# Patient Record
Sex: Male | Born: 1965
Health system: Southern US, Community
[De-identification: ages and names within clinical notes are randomized; demographics above are authoritative.]

## PROBLEM LIST (undated history)

## (undated) DIAGNOSIS — I10 Essential (primary) hypertension: Secondary | ICD-10-CM

## (undated) DIAGNOSIS — K219 Gastro-esophageal reflux disease without esophagitis: Secondary | ICD-10-CM

## (undated) DIAGNOSIS — Z5189 Encounter for other specified aftercare: Secondary | ICD-10-CM

## (undated) DIAGNOSIS — E119 Type 2 diabetes mellitus without complications: Secondary | ICD-10-CM

## (undated) DIAGNOSIS — I509 Heart failure, unspecified: Secondary | ICD-10-CM

## (undated) DIAGNOSIS — D649 Anemia, unspecified: Secondary | ICD-10-CM

## (undated) DIAGNOSIS — E785 Hyperlipidemia, unspecified: Secondary | ICD-10-CM

## (undated) HISTORY — DX: Anemia, unspecified: D64.9

## (undated) HISTORY — DX: Encounter for other specified aftercare: Z51.89

## (undated) HISTORY — DX: Hyperlipidemia, unspecified: E78.5

## (undated) HISTORY — DX: Heart failure, unspecified: I50.9

## (undated) HISTORY — DX: Essential (primary) hypertension: I10

## (undated) HISTORY — DX: Gastro-esophageal reflux disease without esophagitis: K21.9

## (undated) HISTORY — DX: Type 2 diabetes mellitus without complications: E11.9

## (undated) HISTORY — PX: ORIF HIP FRACTURE: SHX2125

## (undated) SURGERY — Surgical Case
Anesthesia: *Unknown

---

## 2001-12-22 HISTORY — PX: ORIF HIP FRACTURE: SHX2125

## 2007-04-19 ENCOUNTER — Ambulatory Visit (HOSPITAL_COMMUNITY): Admission: RE | Admit: 2007-04-19 | Discharge: 2007-04-19 | Payer: Self-pay | Admitting: *Deleted

## 2007-04-19 HISTORY — PX: COLONOSCOPY: SHX174

## 2007-06-23 LAB — HM CT VIRTUAL COLONOSCOPY

## 2012-01-19 ENCOUNTER — Ambulatory Visit (INDEPENDENT_AMBULATORY_CARE_PROVIDER_SITE_OTHER): Payer: Managed Care, Other (non HMO)

## 2012-01-19 DIAGNOSIS — E669 Obesity, unspecified: Secondary | ICD-10-CM

## 2012-01-19 DIAGNOSIS — Z23 Encounter for immunization: Secondary | ICD-10-CM

## 2012-01-19 DIAGNOSIS — I10 Essential (primary) hypertension: Secondary | ICD-10-CM

## 2012-01-19 DIAGNOSIS — E119 Type 2 diabetes mellitus without complications: Secondary | ICD-10-CM

## 2012-01-19 DIAGNOSIS — E7800 Pure hypercholesterolemia, unspecified: Secondary | ICD-10-CM

## 2012-01-26 ENCOUNTER — Other Ambulatory Visit: Payer: Self-pay | Admitting: Family Medicine

## 2012-01-26 MED ORDER — SIMVASTATIN 40 MG PO TABS: 40.0000 mg | ORAL_TABLET | Freq: Every day | ORAL | Status: DC

## 2012-01-29 ENCOUNTER — Telehealth: Payer: Self-pay

## 2012-01-29 NOTE — Telephone Encounter (Signed)
.  [facility]   PT IS REQUESTING HIS TEST RESULTS  PLEASE CALL

## 2012-01-30 NOTE — Telephone Encounter (Signed)
RETURNED CALL TO B.JACKSON

## 2012-01-31 ENCOUNTER — Telehealth: Payer: Self-pay

## 2012-01-31 NOTE — Telephone Encounter (Signed)
Wants to discuss test results.

## 2012-02-02 NOTE — Telephone Encounter (Signed)
[facility] at home number explaining test results and Dr Ellis Parents note on labs. Asked for CB if pt has further questions.

## 2012-03-06 ENCOUNTER — Other Ambulatory Visit: Payer: Self-pay | Admitting: Emergency Medicine

## 2012-04-10 ENCOUNTER — Other Ambulatory Visit: Payer: Self-pay | Admitting: Emergency Medicine

## 2012-04-16 ENCOUNTER — Other Ambulatory Visit: Payer: Self-pay | Admitting: Emergency Medicine

## 2012-08-03 ENCOUNTER — Other Ambulatory Visit: Payer: Self-pay | Admitting: Emergency Medicine

## 2012-08-03 NOTE — Telephone Encounter (Signed)
Chart pulled [id number]

## 2012-08-03 NOTE — Telephone Encounter (Signed)
Needs office visit.

## 2012-09-11 ENCOUNTER — Other Ambulatory Visit: Payer: Self-pay | Admitting: Physician Assistant

## 2012-09-11 ENCOUNTER — Other Ambulatory Visit: Payer: Self-pay | Admitting: Emergency Medicine

## 2012-10-01 ENCOUNTER — Other Ambulatory Visit: Payer: Self-pay | Admitting: Physician Assistant

## 2012-10-04 NOTE — Telephone Encounter (Signed)
Patients chart is at the nurses station in the pa pool pile.  [facility] [id number]

## 2012-10-14 ENCOUNTER — Other Ambulatory Visit: Payer: Self-pay | Admitting: Physician Assistant

## 2012-11-21 ENCOUNTER — Other Ambulatory Visit: Payer: Self-pay | Admitting: Physician Assistant

## 2012-12-16 ENCOUNTER — Other Ambulatory Visit: Payer: Self-pay | Admitting: Physician Assistant

## 2012-12-17 ENCOUNTER — Ambulatory Visit (INDEPENDENT_AMBULATORY_CARE_PROVIDER_SITE_OTHER): Payer: Managed Care, Other (non HMO) | Admitting: Family Medicine

## 2012-12-17 VITALS — BP 142/91 | HR 92 | Temp 97.7°F | Resp 18 | Ht 68.5 in | Wt 235.4 lb

## 2012-12-17 DIAGNOSIS — B356 Tinea cruris: Secondary | ICD-10-CM

## 2012-12-17 DIAGNOSIS — E119 Type 2 diabetes mellitus without complications: Secondary | ICD-10-CM

## 2012-12-17 DIAGNOSIS — N529 Male erectile dysfunction, unspecified: Secondary | ICD-10-CM

## 2012-12-17 DIAGNOSIS — I10 Essential (primary) hypertension: Secondary | ICD-10-CM

## 2012-12-17 DIAGNOSIS — E785 Hyperlipidemia, unspecified: Secondary | ICD-10-CM

## 2012-12-17 LAB — COMPREHENSIVE METABOLIC PANEL
ALT: 20 U/L (ref 0–53)
AST: 15 U/L (ref 0–37)
Albumin: 4.5 g/dL (ref 3.5–5.2)
Alkaline Phosphatase: 57 U/L (ref 39–117)
Glucose, Bld: 295 mg/dL — ABNORMAL HIGH (ref 70–99)
Potassium: 4.5 mEq/L (ref 3.5–5.3)
Sodium: 134 mEq/L — ABNORMAL LOW (ref 135–145)
Total Protein: 7.3 g/dL (ref 6.0–8.3)

## 2012-12-17 LAB — POCT GLYCOSYLATED HEMOGLOBIN (HGB A1C): Hemoglobin A1C: 11.3

## 2012-12-17 LAB — LIPID PANEL
LDL Cholesterol: 88 mg/dL (ref 0–99)
Triglycerides: 224 mg/dL — ABNORMAL HIGH (ref <150)

## 2012-12-17 MED ORDER — GLIPIZIDE ER 5 MG PO TB24: 5.0000 mg | ORAL_TABLET | Freq: Every day | ORAL | Status: DC

## 2012-12-17 MED ORDER — SIMVASTATIN 40 MG PO TABS: 40.0000 mg | ORAL_TABLET | Freq: Every day | ORAL | Status: DC

## 2012-12-17 MED ORDER — FLUCONAZOLE 150 MG PO TABS: 150.0000 mg | ORAL_TABLET | Freq: Once | ORAL | Status: DC

## 2012-12-17 MED ORDER — SILDENAFIL CITRATE 100 MG PO TABS: 100.0000 mg | ORAL_TABLET | ORAL | Status: DC | PRN

## 2012-12-17 MED ORDER — METFORMIN HCL 1000 MG PO TABS: 1000.0000 mg | ORAL_TABLET | Freq: Two times a day (BID) | ORAL | Status: DC

## 2012-12-17 MED ORDER — GLIPIZIDE ER 10 MG PO TB24: 10.0000 mg | ORAL_TABLET | Freq: Every day | ORAL | Status: DC

## 2012-12-17 MED ORDER — LOSARTAN POTASSIUM 100 MG PO TABS: 100.0000 mg | ORAL_TABLET | Freq: Every day | ORAL | Status: DC

## 2012-12-17 NOTE — Progress Notes (Signed)
[facility] [address] [place] [address] [phone]  Date:  12/17/2012   Name:  Maggie Dworkin   DOB:  27-Mar-1966   MRN:  [id number]  PCP:  No primary provider on file.    Chief Complaint: Medication Refill   History of Present Illness:  Salvador Coupe is a 46 y.o. very pleasant male patient who presents with the following:  He is here today for medication refills.  He was last here about one year ago.  He is doing well and has no complaints. He ran out of his meds just yesterday.  He is fasting today.    He also noted recurrent problems with his penis- he will develop redness and irritation around the glans, especially after he has intercourse.  He has been told this was related to his DM in the past and has used some sort of cream which helped.    Also, there is some confusion on his medication list.    Glucotrol is listed, but he states he was told to stop this last year.  However, it was refilled last in March of this year.  At any rate. He is not taking it at this time.  He does not check his blood sugars much but when he does check they tend to be in the 300s or higher  There is no problem list on file for this patient.   No past medical history on file.  No past surgical history on file.  History  Substance Use Topics  . Smoking status: Never Smoker   . Smokeless tobacco: Not on file  . Alcohol Use: No    No family history on file.  Allergies no known allergies  Medication list has been reviewed and updated.  Current Outpatient Prescriptions on File Prior to Visit  Medication Sig Dispense Refill  . glipiZIDE (GLUCOTROL XL) 10 MG 24 hr tablet TAKE 1 TABLET BY MOUTH DAILY. OFFICE VISIT  30 tablet  0  . losartan (COZAAR) 100 MG tablet TAKE 1 TABLET BY MOUTH ONCE A DAY  15 tablet  0  . metFORMIN (GLUCOPHAGE) 1000 MG tablet TAKE 1 TABLET BY MOUTH TWICE A DAY  30 tablet  0  . simvastatin (ZOCOR) 40 MG tablet TAKE 1 TABLET BY MOUTH DAILY  15  tablet  0  . VIAGRA 100 MG tablet USE AS DIRECTED  9 tablet  1    Review of Systems:  As per HPI- otherwise negative.   Physical Examination: Filed Vitals:   12/17/12 1111  BP: 142/91  Pulse: 92  Temp: 97.7 F (36.5 C)  Resp: 18   Filed Vitals:   12/17/12 1111  Height: 5' 8.5" (1.74 m)  Weight: 235 lb 6.4 oz (106.777 kg)   Body mass index is 35.27 kg/(m^2). Ideal Body Weight: Weight in (lb) to have BMI = 25: 166.5   GEN: WDWN, NAD, Non-toxic, A & O x 3, obese HEENT: Atraumatic, Normocephalic. Neck supple. No masses, No LAD. Ears and Nose: No external deformity. CV: RRR, No M/G/R. No JVD. No thrill. No extra heart sounds. PULM: CTA B, no wheezes, crackles, rhonchi. No retractions. No resp. distress. No accessory muscle use. ABD: S, NT, ND, +BS. No rebound. No HSM. EXTR: No c/c/e NEURO Normal gait.  PSYCH: Normally interactive. Conversant. Not depressed or anxious appearing.  Calm demeanor.  GU: uncircumcised.  The glans shows irritation and mild redness consistent with a yeast infection of the glans.  Results for orders placed in visit on 12/17/12  POCT GLYCOSYLATED HEMOGLOBIN (HGB A1C)      Component Value Range   Hemoglobin A1C 11.3      Assessment and Plan: 1. Diabetes mellitus, type 2  metFORMIN (GLUCOPHAGE) 1000 MG tablet, POCT glycosylated hemoglobin (Hb A1C), Lipid panel, glipiZIDE (GLUCOTROL XL) 5 MG 24 hr tablet, DISCONTINUED: glipiZIDE (GLUCOTROL XL) 10 MG 24 hr tablet  2. HTN (hypertension)  losartan (COZAAR) 100 MG tablet, Comprehensive metabolic panel, Lipid panel  3. Hyperlipidemia  simvastatin (ZOCOR) 40 MG tablet, Lipid panel  4. ED (erectile dysfunction)  sildenafil (VIAGRA) 100 MG tablet, Lipid panel  5. Tinea cruris  fluconazole (DIFLUCAN) 150 MG tablet   Poorly controlled DM.  Continue metformin and ad back glucotrol XL at 5mg  a day.  Counseled him that he must work on diet, exercise and weight loss.  Persistent yeast balinitis.  Related to  elevated glucose.  Will treat with oral diflucan, 2 pills 72 hours apart.  Hold zocor/ viagra while on antifungal.    Bp control ok, weight loss will also help here.    Refilled zocor, refilled viagra.  Await labs- Will plan further follow- up pending labs.   Abbe Amsterdam, MD

## 2012-12-17 NOTE — Patient Instructions (Addendum)
Buy some lotrimin cream and use it on your penis twice a day.  Be sure to keep the area dry and cool.   Take a diflucan once, then repeat in 3 days.  Do not take your zocor or viagra for 2 days prior to taking the diflucan, and continue to hold your zocor and viagra until your finish the diflucan.    Please come and see [place] in 2 months to follow- up your diabetes.    Your diabetes is NOT well controlled- your A1c is 11.3- this gives an idea of your average blood sugar over the last 2 months.  Our goal for your A1c is less than 7%.  We are going to restart your glucotrol at 5mg  (in addition to metformin) to bring your blood sugars down.  However, we also need you to start exercising more and to lose weight.

## 2012-12-18 ENCOUNTER — Encounter: Payer: Self-pay | Admitting: Family Medicine

## 2013-01-08 ENCOUNTER — Ambulatory Visit (INDEPENDENT_AMBULATORY_CARE_PROVIDER_SITE_OTHER): Payer: Managed Care, Other (non HMO) | Admitting: Family Medicine

## 2013-01-08 VITALS — BP 103/70 | HR 93 | Temp 97.8°F | Resp 18 | Ht 69.0 in | Wt 239.0 lb

## 2013-01-08 DIAGNOSIS — J40 Bronchitis, not specified as acute or chronic: Secondary | ICD-10-CM

## 2013-01-08 MED ORDER — AZITHROMYCIN 250 MG PO TABS: ORAL_TABLET | ORAL | Status: DC

## 2013-01-08 MED ORDER — BENZONATATE 200 MG PO CAPS: 200.0000 mg | ORAL_CAPSULE | Freq: Three times a day (TID) | ORAL | Status: DC | PRN

## 2013-01-08 NOTE — Progress Notes (Signed)
Patient ID: Alexander [name] MRN: [id number], DOB: March 04, 1966, 47 y.o. Date of Encounter: 01/08/2013, 2:14 PM  Primary Physician: No primary provider on file.  Chief Complaint:  Chief Complaint  Patient presents with  . Cough    x 6 days  . Generalized Body Aches    x 6 days  . chest congestion    x 6 days    HPI: 47 y.o. year old male presents with a 7 day history of nasal congestion, post nasal drip, sore throat, and cough. Mild sinus pressure. Afebrile. No chills. Nasal congestion thick and green/yellow. Cough is productive of green/yellow sputum and not associated with time of day. Ears feel full, leading to sensation of muffled hearing. Has tried OTC cold preps without success. No GI complaints. Appetite decreased  No blurry vision, polyuria or hyperglycemia  No sick contacts, recent antibiotics, or recent travels.   No leg trauma, sedentary periods, h/o cancer, or tobacco use.  History reviewed. No pertinent past medical history.   Home Meds: Prior to Admission medications   Medication Sig Start Date End Date Taking? Authorizing Provider  glipiZIDE (GLUCOTROL XL) 5 MG 24 hr tablet Take 1 tablet (5 mg total) by mouth daily. 12/17/12  Yes Gwenlyn Found Copland, MD  losartan (COZAAR) 100 MG tablet Take 1 tablet (100 mg total) by mouth daily. 12/17/12  Yes Gwenlyn Found Copland, MD  metFORMIN (GLUCOPHAGE) 1000 MG tablet Take 1 tablet (1,000 mg total) by mouth 2 (two) times daily. 12/17/12  Yes Gwenlyn Found Copland, MD  sildenafil (VIAGRA) 100 MG tablet Take 1 tablet (100 mg total) by mouth as needed for erectile dysfunction. 12/17/12  Yes Gwenlyn Found Copland, MD  simvastatin (ZOCOR) 40 MG tablet Take 1 tablet (40 mg total) by mouth at bedtime. 12/17/12  Yes Gwenlyn Found Copland, MD  fluconazole (DIFLUCAN) 150 MG tablet Take 1 tablet (150 mg total) by mouth once. Repeat in 3 days 12/17/12   Pearline Cables, MD    Allergies: No Known Allergies  History   Social History  . Marital Status:  Married    Spouse Name: N/A    Number of Children: N/A  . Years of Education: N/A   Occupational History  . Not on file.   Social History Main Topics  . Smoking status: Never Smoker   . Smokeless tobacco: Not on file  . Alcohol Use: No  . Drug Use: No  . Sexually Active: Not on file   Other Topics Concern  . Not on file   Social History Narrative  . No narrative on file     Review of Systems: Constitutional: negative for chills, fever, night sweats or weight changes Cardiovascular: negative for chest pain or palpitations Respiratory: negative for hemoptysis, wheezing, or shortness of breath Abdominal: negative for abdominal pain, nausea, vomiting or diarrhea Dermatological: negative for rash Neurologic: negative for headache   Physical Exam: Blood pressure 103/70, pulse 93, temperature 97.8 F (36.6 C), temperature source Oral, resp. rate 18, height 5\' 9"  (1.753 m), weight 239 lb (108.41 kg), SpO2 97.00%., Body mass index is 35.29 kg/(m^2). General: Well developed, well nourished, in no acute distress. Head: Normocephalic, atraumatic, eyes without discharge, sclera non-icteric, nares are congested. Bilateral auditory canals clear, TM's are without perforation, pearly grey with reflective cone of light bilaterally. No sinus TTP. Oral cavity moist, dentition normal. Posterior pharynx with post nasal drip and mild erythema. No peritonsillar abscess or tonsillar exudate. Neck: Supple. No thyromegaly. Full ROM. No lymphadenopathy. Lungs: Coarse  breath sounds bilaterally without wheezes, rales, or rhonchi. Breathing is unlabored.  Heart: RRR with S1 S2. No murmurs, rubs, or gallops appreciated. Msk:  Strength and tone normal for age. Extremities: No clubbing or cyanosis. No edema. Neuro: Alert and oriented X 3. Moves all extremities spontaneously. CNII-XII grossly in tact. Psych:  Responds to questions appropriately with a normal affect.      ASSESSMENT AND PLAN:  47 y.o.  year old male with bronchitis. - -Mucinex -Tylenol/Motrin prn -Rest/fluids -RTC precautions -RTC 3-5 days if no improvement  Signed, Elvina Sidle, MD 01/08/2013 2:14 PM

## 2013-01-08 NOTE — Patient Instructions (Signed)

## 2013-04-01 ENCOUNTER — Other Ambulatory Visit: Payer: Self-pay | Admitting: Family Medicine

## 2013-04-02 ENCOUNTER — Other Ambulatory Visit: Payer: Self-pay | Admitting: Family Medicine

## 2013-04-14 ENCOUNTER — Ambulatory Visit: Payer: Managed Care, Other (non HMO) | Admitting: Emergency Medicine

## 2013-04-14 VITALS — BP 124/84 | HR 80 | Temp 98.4°F | Resp 18 | Wt 241.0 lb

## 2013-04-14 DIAGNOSIS — N489 Disorder of penis, unspecified: Secondary | ICD-10-CM

## 2013-04-14 DIAGNOSIS — N481 Balanitis: Secondary | ICD-10-CM

## 2013-04-14 DIAGNOSIS — N476 Balanoposthitis: Secondary | ICD-10-CM

## 2013-04-14 DIAGNOSIS — N4889 Other specified disorders of penis: Secondary | ICD-10-CM

## 2013-04-14 DIAGNOSIS — E785 Hyperlipidemia, unspecified: Secondary | ICD-10-CM

## 2013-04-14 DIAGNOSIS — E119 Type 2 diabetes mellitus without complications: Secondary | ICD-10-CM

## 2013-04-14 LAB — LIPID PANEL
Cholesterol: 144 mg/dL (ref 0–200)
HDL: 52 mg/dL (ref >39)
Total CHOL/HDL Ratio: 2.8 Ratio
Triglycerides: 131 mg/dL (ref <150)
VLDL: 26 mg/dL (ref 0–40)

## 2013-04-14 MED ORDER — METFORMIN HCL 1000 MG PO TABS: 1000.0000 mg | ORAL_TABLET | Freq: Two times a day (BID) | ORAL | Status: DC

## 2013-04-14 MED ORDER — FLUCONAZOLE 150 MG PO TABS: 150.0000 mg | ORAL_TABLET | Freq: Once | ORAL | Status: DC

## 2013-04-14 NOTE — Progress Notes (Signed)
  Subjective:    Patient ID: Alexander [name], male    DOB: Mar 27, 1966, 47 y.o.   MRN: [id number]  HPI 47 yo male here for recheck on blood pressure and diabetes. Occasionally checks BP at drug store where it is generally 130/75. Checks sugar regularly, generally between 220-300. Saw eye doctor last month. Also reports he has a yeast infection on his penis, which he would like a refill of Fluconazole for. Also has a circular lesion beneath the foreskin of his penis that has been there for at least a month.     Review of Systems  Constitutional: Negative for fever, chills and unexpected weight change.  Respiratory: Negative.   Cardiovascular: Negative.   Gastrointestinal: Negative for nausea, vomiting, diarrhea and constipation.  Skin: Positive for rash (in groin).       Objective:   Physical Exam  Constitutional: He is oriented to person, place, and time. He appears well-developed and well-nourished.  HENT:  Head: Normocephalic and atraumatic.  Cardiovascular: Normal rate, regular rhythm and normal heart sounds.   Pulmonary/Chest: Effort normal and breath sounds normal.  Abdominal: Soft. Bowel sounds are normal.  Neurological: He is alert and oriented to person, place, and time.  Skin: Skin is warm and dry.  Has a 1x1cm circumscribed lesion at the base of the glans with central crater formation which is non-tender. Some whitish discharge on the under side of the foreskin with mild redness.  Results for orders placed in visit on 04/14/13  GLUCOSE, POCT (MANUAL RESULT ENTRY)      Result Value Range   POC Glucose 257 (*) 70 - 99 mg/dl  POCT GLYCOSYLATED HEMOGLOBIN (HGB A1C)      Result Value Range   Hemoglobin A1C 11.2            Assessment & Plan:  Hypertension: controlled. Continue current dose of medication Hyperlipidemia: check lipids today Diabetes: Not controlled, increased metformin to 1000mg  bid. Balanitis: Rx for diflucan given. Penile lesion: RPR ordered today.  Referral to derm for evaluation and possible biopsy.

## 2013-05-12 ENCOUNTER — Other Ambulatory Visit: Payer: Self-pay | Admitting: Emergency Medicine

## 2013-05-13 NOTE — Telephone Encounter (Signed)
Dr Cleta Alberts, do you want to RF this? Pt appt w/dermatologist is today, do you want him to Rx if needed?

## 2013-06-07 ENCOUNTER — Other Ambulatory Visit: Payer: Self-pay | Admitting: Family Medicine

## 2013-06-18 ENCOUNTER — Other Ambulatory Visit: Payer: Self-pay | Admitting: Emergency Medicine

## 2013-06-20 NOTE — Telephone Encounter (Signed)
Dr Cleta Alberts, do you want to RF or need to see pt for eval first?

## 2013-06-23 ENCOUNTER — Other Ambulatory Visit: Payer: Self-pay | Admitting: Family Medicine

## 2013-07-11 ENCOUNTER — Other Ambulatory Visit: Payer: Self-pay | Admitting: Family Medicine

## 2013-09-17 ENCOUNTER — Ambulatory Visit (INDEPENDENT_AMBULATORY_CARE_PROVIDER_SITE_OTHER): Payer: Managed Care, Other (non HMO) | Admitting: Emergency Medicine

## 2013-09-17 VITALS — BP 110/70 | HR 91 | Temp 98.1°F | Resp 16 | Ht 69.5 in | Wt 240.0 lb

## 2013-09-17 DIAGNOSIS — H469 Unspecified optic neuritis: Secondary | ICD-10-CM

## 2013-09-17 DIAGNOSIS — E119 Type 2 diabetes mellitus without complications: Secondary | ICD-10-CM

## 2013-09-17 LAB — COMPREHENSIVE METABOLIC PANEL
ALT: 23 U/L (ref 0–53)
AST: 15 U/L (ref 0–37)
Calcium: 9.6 mg/dL (ref 8.4–10.5)
Chloride: 97 mEq/L (ref 96–112)
Creat: 0.76 mg/dL (ref 0.50–1.35)
Sodium: 133 mEq/L — ABNORMAL LOW (ref 135–145)
Total Bilirubin: 1.5 mg/dL — ABNORMAL HIGH (ref 0.3–1.2)
Total Protein: 7.6 g/dL (ref 6.0–8.3)

## 2013-09-17 LAB — POCT CBC
Granulocyte percent: 45.2 %G (ref 37–80)
Hemoglobin: 15.6 g/dL (ref 14.1–18.1)
Lymph, poc: 3.6 — AB (ref 0.6–3.4)
MCV: 92.8 fL (ref 80–97)
MID (cbc): 0.4 (ref 0–0.9)
Platelet Count, POC: 245 10*3/uL (ref 142–424)
RBC: 5.18 M/uL (ref 4.69–6.13)

## 2013-09-17 LAB — POCT GLYCOSYLATED HEMOGLOBIN (HGB A1C): Hemoglobin A1C: 11.2

## 2013-09-17 LAB — POCT SEDIMENTATION RATE: POCT SED RATE: 13 mm/hr (ref 0–22)

## 2013-09-17 NOTE — Patient Instructions (Signed)
Please go to [facility] for an MRI of the brain to be done at 6 PM today

## 2013-09-17 NOTE — Progress Notes (Signed)
  Subjective:    Patient ID: Alexander [name], male    DOB: May 08, 1966, 47 y.o.   MRN: [id number]  HPI  47 y.o. Male here per request of Dr. Georganna Skeans. The eye doctor was concerned the patient had non-arteritic  anterior ischemic optic neuropathy   Denies any headaches.States,"when symptoms started I just didn't feel good." Patient states his sugars have been running 150-200. He checks his sugars about 3 times a week. He states he is watching his diet closely to .he states he had an eye exam approximately 1 year ago. The last 2 hemoglobin A1c is 11.2 and 11.3     Review of Systems     Objective:   Physical Exam HEENT exam reveals some blurring of the left lateral and inferior optic disc. Pupils are still dilated from his eye exam earlier today. Neck is supple chest clear heart regular rate no murmurs abdomen is soft nontender extremities without edema. Filament testing revealed normal sensation .Marland Kitchen  Results for orders placed in visit on 09/17/13  POCT CBC      Result Value Range   WBC 7.4  4.6 - 10.2 K/uL   Lymph, poc 3.6 (*) 0.6 - 3.4   POC LYMPH PERCENT 49.2  10 - 50 %L   MID (cbc) 0.4  0 - 0.9   POC MID % 5.6  0 - 12 %M   POC Granulocyte 3.3  2 - 6.9   Granulocyte percent 45.2  37 - 80 %G   RBC 5.18  4.69 - 6.13 M/uL   Hemoglobin 15.6  14.1 - 18.1 g/dL   HCT, POC 62.1  30.8 - 53.7 %   MCV 92.8  80 - 97 fL   MCH, POC 30.1  27 - 31.2 pg   MCHC 32.4  31.8 - 35.4 g/dL   RDW, POC 65.7     Platelet Count, POC 245  142 - 424 K/uL   MPV 8.4  0 - 99.8 fL  GLUCOSE, POCT (MANUAL RESULT ENTRY)      Result Value Range   POC Glucose 200 (*) 70 - 99 mg/dl  POCT GLYCOSYLATED HEMOGLOBIN (HGB A1C)      Result Value Range   Hemoglobin A1C 11.2          Assessment & Plan:  Patient presents with an abnormal left optic disc exam. Case discussed with Dr. Georganna Skeans and Dr. Delaney Meigs . We'll proceed with an MRI no contrast followup to be determined after the scan . Her.  erythrocyte sedimentation rate  still pending. Patient is not a candidate for steroids at the present time due to his uncontrolled diabetes. We'll try and get endocrinology to help with control of his diabetes

## 2013-09-18 ENCOUNTER — Telehealth: Payer: Self-pay

## 2013-09-18 NOTE — Telephone Encounter (Signed)
Spoke with pt this am. I called him to see if we could get an MRI done today. Pt refused and stated he was on his way to [place]. I called Dr Delaney Meigs on his cell phone to discuss the reasoning why we couldn't get the MRI done STAT. He will note this in his chart. Advised Dr Cleta Alberts on this also.

## 2013-09-23 ENCOUNTER — Ambulatory Visit (INDEPENDENT_AMBULATORY_CARE_PROVIDER_SITE_OTHER): Payer: Managed Care, Other (non HMO) | Admitting: Endocrinology

## 2013-09-23 ENCOUNTER — Telehealth: Payer: Self-pay | Admitting: Radiology

## 2013-09-23 ENCOUNTER — Encounter: Payer: Self-pay | Admitting: Endocrinology

## 2013-09-23 VITALS — BP 130/70 | HR 90 | Wt 242.0 lb

## 2013-09-23 DIAGNOSIS — N529 Male erectile dysfunction, unspecified: Secondary | ICD-10-CM

## 2013-09-23 DIAGNOSIS — H539 Unspecified visual disturbance: Secondary | ICD-10-CM

## 2013-09-23 MED ORDER — SITAGLIPTIN PHOSPHATE 100 MG PO TABS: 100.0000 mg | ORAL_TABLET | Freq: Every day | ORAL | Status: DC

## 2013-09-23 MED ORDER — PIOGLITAZONE HCL 45 MG PO TABS: 45.0000 mg | ORAL_TABLET | Freq: Every day | ORAL | Status: DC

## 2013-09-23 MED ORDER — CANAGLIFLOZIN 300 MG PO TABS: 1.0000 | ORAL_TABLET | Freq: Every day | ORAL | Status: DC

## 2013-09-23 NOTE — Patient Instructions (Addendum)
good diet and exercise habits significanly improve the control of your diabetes.  please let me know if you wish to be referred to a dietician.  high blood sugar is very risky to your health.  you should see an eye doctor every year.  You are at higher than average risk for pneumonia and hepatitis-B.  You should be vaccinated against both.   controlling your blood pressure and cholesterol drastically reduces the damage diabetes does to your body.  this also applies to quitting smoking.  please discuss these with your doctor.  check your blood sugar once a day.  vary the time of day when you check, between before the 3 meals, and at bedtime.  also check if you have symptoms of your blood sugar being too high or too low.  please keep a record of the readings and bring it to your next appointment here.  please call us sooner if your blood sugar goes below 70, or if you have a lot of readings over 200. i have sent 3 prescriptions to your pharmacy, to add on for the diabetes. Drink plenty of fluids. Please come back for a follow-up appointment in 1 month.       1800 Calorie Diet for Diabetes Meal Planning The 1800 calorie diet is designed for eating up to 1800 calories each day. Following this diet and making healthy meal choices can help improve overall health. This diet controls blood sugar (glucose) levels and can also help lower blood pressure and cholesterol. SERVING SIZES Measuring foods and serving sizes helps to make sure you are getting the right amount of food. The list below tells how big or small some common serving sizes are:  1 oz.........4 stacked dice.  3 oz........Marland KitchenDeck of cards.  1 tsp.......Marland KitchenTip of little finger.  1 tbs......Marland KitchenMarland KitchenThumb.  2 tbs.......Marland KitchenGolf ball.   cup......Marland KitchenHalf of a fist.  1 cup.......Marland KitchenA fist. GUIDELINES FOR CHOOSING FOODS The goal of this diet is to eat a variety of foods and limit calories to 1800 each day. This can be done by choosing foods that are low  in calories and fat. The diet also suggests eating small amounts of food frequently. Doing this helps control your blood glucose levels so they do not get too high or too low. Each meal or snack may include a protein food source to help you feel more satisfied and to stabilize your blood glucose. Try to eat about the same amount of food around the same time each day. This includes weekend days, travel days, and days off work. Space your meals about 4 to 5 hours apart and add a snack between them if you wish.  For example, a daily food plan could include breakfast, a morning snack, lunch, dinner, and an evening snack. Healthy meals and snacks include whole grains, vegetables, fruits, lean meats, poultry, fish, and dairy products. As you plan your meals, select a variety of foods. Choose from the bread and starch, vegetable, fruit, dairy, and meat/protein groups. Examples of foods from each group and their suggested serving sizes are listed below. Use measuring cups and spoons to become familiar with what a healthy portion looks like. Bread and Starch Each serving equals 15 grams of carbohydrates.  1 slice bread.   bagel.   cup cold cereal (unsweetened).   cup hot cereal or mashed potatoes.  1 small potato (size of a computer mouse).   cup cooked pasta or rice.   English muffin.  1 cup broth-based soup.  3 cups  of popcorn.  4 to 6 whole-wheat crackers.   cup cooked beans, peas, or corn. Vegetable Each serving equals 5 grams of carbohydrates.   cup cooked vegetables.  1 cup raw vegetables.   cup tomato or vegetable juice. Fruit Each serving equals 15 grams of carbohydrates.  1 small apple or orange.  1 cup watermelon or strawberries.   cup applesauce (no sugar added).  2 tbs raisins.   banana.   cup canned fruit, packed in water, its own juice, or sweetened with a sugar substitute.   cup unsweetened fruit juice. Dairy Each serving equals 12 to 15 grams of  carbohydrates.  1 cup fat-free milk.  6 oz artificially sweetened yogurt or plain yogurt.  1 cup low-fat buttermilk.  1 cup soy milk.  1 cup almond milk. Meat/Protein  1 large egg.  2 to 3 oz meat, poultry, or fish.   cup low-fat cottage cheese.  1 tbs peanut butter.  1 oz low-fat cheese.   cup tuna in water.   cup tofu. Fat  1 tsp oil.  1 tsp trans-fat-free margarine.  1 tsp butter.  1 tsp mayonnaise.  2 tbs avocado.  1 tbs salad dressing.  1 tbs cream cheese.  2 tbs sour cream. SAMPLE 1800 CALORIE DIET PLAN Breakfast   cup unsweetened cereal (1 carb serving).  1 cup fat-free milk (1 carb serving).  1 slice whole-wheat toast (1 carb serving).   small banana (1 carb serving).  1 scrambled egg.  1 tsp trans-fat-free margarine. Lunch  Tuna sandwich.  2 slices whole-wheat bread (2 carb servings).   cup canned tuna in water, drained.  1 tbs reduced fat mayonnaise.  1 stalk celery, chopped.  2 slices tomato.  1 lettuce leaf.  1 cup carrot sticks.  24 to 30 seedless grapes (2 carb servings).  6 oz light yogurt (1 carb serving). Afternoon Snack  3 graham cracker squares (1 carb serving).  Fat-free milk, 1 cup (1 carb serving).  1 tbs peanut butter. Dinner  3 oz salmon, broiled with 1 tsp oil.  1 cup mashed potatoes (2 carb servings) with 1 tsp trans-fat-free margarine.  1 cup fresh or frozen green beans.  1 cup steamed asparagus.  1 cup fat-free milk (1 carb serving). Evening Snack  3 cups air-popped popcorn (1 carb serving).  2 tbs parmesan cheese sprinkled on top. MEAL PLAN Use this worksheet to help you make a daily meal plan based on the 1800 calorie diet suggestions. If you are using this plan to help you control your blood glucose, you may interchange carbohydrate-containing foods (dairy, starches, and fruits). Select a variety of fresh foods of varying colors and flavors. The total amount of carbohydrate in  your meals or snacks is more important than making sure you include all of the food groups every time you eat. Choose from the following foods to build your day's meals:  8 Starches.  4 Vegetables.  3 Fruits.  2 Dairy.  6 to 7 oz Meat/Protein.  Up to 4 Fats. Your dietician can use this worksheet to help you decide how many servings and which types of foods are right for you. BREAKFAST Food Group and Servings / Food Choice Starch ________________________________________________________ Dairy _________________________________________________________ Fruit _________________________________________________________ Meat/Protein __________________________________________________ Fat ___________________________________________________________ LUNCH Food Group and Servings / Food Choice Starch ________________________________________________________ Meat/Protein __________________________________________________ Vegetable _____________________________________________________ Fruit _________________________________________________________ Dairy _________________________________________________________ Fat ___________________________________________________________ Aura Fey Food Group and Servings / Food Choice Starch ________________________________________________________ Meat/Protein __________________________________________________ Fruit __________________________________________________________ Dairy _________________________________________________________ Laural Golden Food  Group and Servings / Food Choice Starch _________________________________________________________ Meat/Protein ___________________________________________________ Dairy __________________________________________________________ Vegetable ______________________________________________________ Fruit ___________________________________________________________ Fat  ____________________________________________________________ Lollie Sails Food Group and Servings / Food Choice Fruit __________________________________________________________ Meat/Protein ___________________________________________________ Dairy __________________________________________________________ Starch _________________________________________________________ DAILY TOTALS Starch ____________________________ Vegetable _________________________ Fruit _____________________________ Dairy _____________________________ Meat/Protein______________________ Fat _______________________________ Document Released: 06/30/2005 Document Revised: 03/01/2012 Document Reviewed: 10/24/2011 [employer] Patient Information 2014 [place], [place].

## 2013-09-23 NOTE — Progress Notes (Signed)
Subjective:    Patient ID: Alexander [name], male    DOB: 1966-03-03, 47 y.o.   MRN: [id number]  HPI pt states DM was dx'ed in 2005; he has mild if any neuropathy of the lower extremities; he is unaware of any associated chronic complications.  he has never been on insulin.  pt says his diet and exercise are both "ok."  Past Medical History  Diagnosis Date  . Diabetes mellitus without complication   . Hypertension   . Hyperlipidemia     No past surgical history on file.  History   Social History  . Marital Status: Married    Spouse Name: N/A    Number of Children: N/A  . Years of Education: N/A   Occupational History  . Not on file.   Social History Main Topics  . Smoking status: Never Smoker   . Smokeless tobacco: Not on file  . Alcohol Use: No  . Drug Use: No  . Sexual Activity: Yes   Other Topics Concern  . Not on file   Social History Narrative  . No narrative on file    Current Outpatient Prescriptions on File Prior to Visit  Medication Sig Dispense Refill  . fluconazole (DIFLUCAN) 150 MG tablet TAKE 1 TABLET (150 MG TOTAL) BY MOUTH ONCE. REPEAT IF NEEDED  2 tablet  0  . fluconazole (DIFLUCAN) 150 MG tablet TAKE 1 TABLET (150 MG TOTAL) BY MOUTH ONCE. REPEAT IF NEEDED  2 tablet  0  . glipiZIDE (GLUCOTROL XL) 5 MG 24 hr tablet TAKE 1 TABLET (5 MG TOTAL) BY MOUTH DAILY.  30 tablet  5  . losartan (COZAAR) 100 MG tablet TAKE 1 TABLET (100 MG TOTAL) BY MOUTH DAILY.  30 tablet  2  . metFORMIN (GLUCOPHAGE) 1000 MG tablet Take 1 tablet (1,000 mg total) by mouth 2 (two) times daily.  60 tablet  6  . simvastatin (ZOCOR) 40 MG tablet TAKE 1 TABLET (40 MG TOTAL) BY MOUTH AT BEDTIME.  30 tablet  2  . VIAGRA 100 MG tablet TAKE 1 TABLET (100 MG TOTAL) BY MOUTH AS NEEDED FOR ERECTILE DYSFUNCTION.  9 tablet  0   No current facility-administered medications on file prior to visit.    No Known Allergies  Family History  Problem Relation Age of Onset  . Diabetes Father   . Heart  disease Father    BP 130/70  Pulse 90  Wt 242 lb (109.77 kg)  BMI 35.24 kg/m2  SpO2 94%  Review of Systems denies weight loss, headache, chest pain, sob, n/v, urinary frequency, cramps, excessive diaphoresis, memory loss, depression, rhinorrhea, and easy bruising.  He has ED sxs. He has mild blurry vision.      Objective:   Physical Exam VS: see vs page GEN: no distress HEAD: head: no deformity eyes: no periorbital swelling, no proptosis external nose and ears are normal mouth: no lesion seen NECK: supple, thyroid is not enlarged CHEST WALL: no deformity LUNGS: clear to auscultation BREASTS:  No gynecomastia CV: reg rate and rhythm, no murmur ABD: abdomen is soft, nontender.  no hepatosplenomegaly.  not distended.  no hernia MUSCULOSKELETAL: muscle bulk and strength are grossly normal.  no obvious joint swelling.  gait is normal and steady PULSES: no carotid bruit NEURO:  cn 2-12 grossly intact.   readily moves all 4's.   SKIN:  Normal texture and temperature.  No rash or suspicious lesion is visible.   NODES:  None palpable at the neck PSYCH: alert, oriented  x3.  Does not appear anxious nor depressed. Lab Results  Component Value Date   HGBA1C 11.2 09/17/2013      Assessment & Plan:  DM: very high risk to his health at this A1c level. Occupational history: trucker. This limits rx options.  He declines insulin. Blurry vision: possible due to hyperglycemia. ED: this may improve with improvement in glycemia control.

## 2013-09-23 NOTE — Telephone Encounter (Signed)
Orbital mri ordered in addition to MRI brain

## 2013-09-24 DIAGNOSIS — N529 Male erectile dysfunction, unspecified: Secondary | ICD-10-CM | POA: Insufficient documentation

## 2013-09-26 ENCOUNTER — Telehealth: Payer: Self-pay | Admitting: Emergency Medicine

## 2013-09-26 ENCOUNTER — Ambulatory Visit
Admission: RE | Admit: 2013-09-26 | Discharge: 2013-09-26 | Disposition: A | Discharge status: Home / Self Care | Payer: Managed Care, Other (non HMO) | Source: Ambulatory Visit | Attending: Emergency Medicine | Admitting: Emergency Medicine

## 2013-09-26 ENCOUNTER — Other Ambulatory Visit: Payer: Self-pay | Admitting: Emergency Medicine

## 2013-09-26 DIAGNOSIS — H539 Unspecified visual disturbance: Secondary | ICD-10-CM

## 2013-09-26 DIAGNOSIS — H469 Unspecified optic neuritis: Secondary | ICD-10-CM

## 2013-09-26 DIAGNOSIS — R9089 Other abnormal findings on diagnostic imaging of central nervous system: Secondary | ICD-10-CM

## 2013-09-26 MED ORDER — GADOBENATE DIMEGLUMINE 529 MG/ML IV SOLN
20.0000 mL | Freq: Once | INTRAVENOUS | Status: AC | PRN
  Administered 2013-09-26: 20 mL via INTRAVENOUS

## 2013-09-26 NOTE — Telephone Encounter (Signed)
Called and discussed MRI results. Neuro referral made. To see  Dr. Delaney Meigs on Friday.

## 2013-10-03 ENCOUNTER — Ambulatory Visit: Payer: Managed Care, Other (non HMO) | Admitting: Neurology

## 2013-10-07 ENCOUNTER — Other Ambulatory Visit: Payer: Self-pay | Admitting: Physician Assistant

## 2013-10-07 ENCOUNTER — Other Ambulatory Visit: Payer: Self-pay | Admitting: Family Medicine

## 2013-10-13 ENCOUNTER — Other Ambulatory Visit: Payer: Self-pay | Admitting: Family Medicine

## 2013-10-31 ENCOUNTER — Ambulatory Visit: Payer: Managed Care, Other (non HMO) | Admitting: Endocrinology

## 2013-12-07 ENCOUNTER — Other Ambulatory Visit: Payer: Self-pay | Admitting: Physician Assistant

## 2013-12-13 ENCOUNTER — Other Ambulatory Visit: Payer: Self-pay | Admitting: Emergency Medicine

## 2013-12-15 ENCOUNTER — Other Ambulatory Visit: Payer: Self-pay | Admitting: Physician Assistant

## 2014-02-05 ENCOUNTER — Other Ambulatory Visit: Payer: Self-pay | Admitting: Emergency Medicine

## 2014-03-07 ENCOUNTER — Other Ambulatory Visit: Payer: Self-pay | Admitting: Emergency Medicine

## 2014-03-09 ENCOUNTER — Other Ambulatory Visit: Payer: Self-pay | Admitting: Emergency Medicine

## 2014-03-24 ENCOUNTER — Ambulatory Visit (INDEPENDENT_AMBULATORY_CARE_PROVIDER_SITE_OTHER): Payer: Managed Care, Other (non HMO) | Admitting: Family Medicine

## 2014-03-24 VITALS — BP 130/90 | HR 106 | Temp 97.9°F | Resp 16 | Ht 70.0 in | Wt 241.0 lb

## 2014-03-24 DIAGNOSIS — IMO0001 Reserved for inherently not codable concepts without codable children: Secondary | ICD-10-CM

## 2014-03-24 DIAGNOSIS — L0201 Cutaneous abscess of face: Secondary | ICD-10-CM

## 2014-03-24 DIAGNOSIS — L03211 Cellulitis of face: Principal | ICD-10-CM

## 2014-03-24 DIAGNOSIS — IMO0002 Reserved for concepts with insufficient information to code with codable children: Secondary | ICD-10-CM

## 2014-03-24 DIAGNOSIS — E1165 Type 2 diabetes mellitus with hyperglycemia: Secondary | ICD-10-CM

## 2014-03-24 DIAGNOSIS — E119 Type 2 diabetes mellitus without complications: Secondary | ICD-10-CM

## 2014-03-24 LAB — POCT GLYCOSYLATED HEMOGLOBIN (HGB A1C): Hemoglobin A1C: 11.2

## 2014-03-24 LAB — GLUCOSE, POCT (MANUAL RESULT ENTRY): POC GLUCOSE: 273 mg/dL — AB (ref 70–99)

## 2014-03-24 MED ORDER — METFORMIN HCL 1000 MG PO TABS: 1000.0000 mg | ORAL_TABLET | Freq: Two times a day (BID) | ORAL | Status: DC

## 2014-03-24 MED ORDER — GLIPIZIDE 5 MG PO TABS: 5.0000 mg | ORAL_TABLET | Freq: Two times a day (BID) | ORAL | Status: DC

## 2014-03-24 MED ORDER — DOXYCYCLINE HYCLATE 100 MG PO TABS: 100.0000 mg | ORAL_TABLET | Freq: Two times a day (BID) | ORAL | Status: DC

## 2014-03-24 NOTE — Patient Instructions (Addendum)
Warm compresses, soap and water to area - no further peroxide.  Antibiotic as discussed. Return to the clinic or go to the nearest emergency room if any of your symptoms worsen or new symptoms occur. You should receive a call or letter about your lab results within the next week to 10 days.   In regards to your diabetes - the endocrinologist had indicated to follow up in 1 month when he saw you in October and new meds were recommended.  I would recommend follow up with endocrinology as Dr. Everlene Farrier recommended, but if you want to return to discuss options further with Dr. Everlene Farrier as requested - he is here Sunday, He can also recheck the infection on the face at that time.   Restart Januvia 100mg  each day(There are refills available for the Januvia - check with your pharmacy and restart this medicine), and will increase glipizide to 5mg  twice per day with meals.  Check your blood sugar at least twice per day this next week and watch for low blood sugar symptoms see the information below.  Carry some form of glucose with you at all times.     Cellulitis Cellulitis is an infection of the skin and the tissue beneath it. The infected area is usually red and tender. Cellulitis occurs most often in the arms and lower legs.  CAUSES  Cellulitis is caused by bacteria that enter the skin through cracks or cuts in the skin. The most common types of bacteria that cause cellulitis are Staphylococcus and Streptococcus. SYMPTOMS   Redness and warmth.  Swelling.  Tenderness or pain.  Fever. DIAGNOSIS  Your caregiver can usually determine what is wrong based on a physical exam. Blood tests may also be done. TREATMENT  Treatment usually involves taking an antibiotic medicine. HOME CARE INSTRUCTIONS   Take your antibiotics as directed. Finish them even if you start to feel better.  Keep the infected arm or leg elevated to reduce swelling.  Apply a warm cloth to the affected area up to 4 times per day to  relieve pain.  Only take over-the-counter or prescription medicines for pain, discomfort, or fever as directed by your caregiver.  Keep all follow-up appointments as directed by your caregiver. SEEK MEDICAL CARE IF:   You notice red streaks coming from the infected area.  Your red area gets larger or turns dark in color.  Your bone or joint underneath the infected area becomes painful after the skin has healed.  Your infection returns in the same area or another area.  You notice a swollen bump in the infected area.  You develop new symptoms. SEEK IMMEDIATE MEDICAL CARE IF:   You have a fever.  You feel very sleepy.  You develop vomiting or diarrhea.  You have a general ill feeling (malaise) with muscle aches and pains. MAKE SURE YOU:   Understand these instructions.  Will watch your condition.  Will get help right away if you are not doing well or get worse. Document Released: 09/17/2005 Document Revised: 06/08/2012 Document Reviewed: 02/23/2012 [facility] Patient Information 2014 [place].  Hypoglycemia (Low Blood Sugar) Hypoglycemia is when the glucose (sugar) in your blood is too low. Hypoglycemia can happen for many reasons. It can happen to people with or without diabetes. Hypoglycemia can develop quickly and can be a medical emergency.  CAUSES  Having hypoglycemia does not mean that you will develop diabetes. Different causes include:  Missed or delayed meals or not enough carbohydrates eaten.  Medication overdose. This  could be by accident or deliberate. If by accident, your medication may need to be adjusted or changed.  Exercise or increased activity without adjustments in carbohydrates or medications.  A nerve disorder that affects body functions like your heart rate, blood pressure and digestion (autonomic neuropathy).  A condition where the stomach muscles do not function properly (gastroparesis). Therefore, medications may not absorb  properly.  The inability to recognize the signs of hypoglycemia (hypoglycemic unawareness).  Absorption of insulin  may be altered.  Alcohol consumption.  Pregnancy/menstrual cycles/postpartum. This may be due to hormones.  Certain kinds of tumors. This is very rare. SYMPTOMS   Sweating.  Hunger.  Dizziness.  Blurred vision.  Drowsiness.  Weakness.  Headache.  Rapid heart beat.  Shakiness.  Nervousness. DIAGNOSIS  Diagnosis is made by monitoring blood glucose in one or all of the following ways:  Fingerstick blood glucose monitoring.  Laboratory results. TREATMENT  If you think your blood glucose is low:  Check your blood glucose, if possible. If it is less than 70 mg/dl, take one of the following:  3-4 glucose tablets.   cup juice (prefer clear like apple).   cup "regular" soda pop.  1 cup milk.  -1 tube of glucose gel.  5-6 hard candies.  Do not over treat because your blood glucose (sugar) will only go too high.  Wait 15 minutes and recheck your blood glucose. If it is still less than 70 mg/dl (or below your target range), repeat treatment.  Eat a snack if it is more than one hour until your next meal. Sometimes, your blood glucose may go so low that you are unable to treat yourself. You may need someone to help you. You may even pass out or be unable to swallow. This may require you to get an injection of glucagon, which raises the blood glucose. HOME CARE INSTRUCTIONS  Check blood glucose as recommended by your caregiver.  Take medication as prescribed by your caregiver.  Follow your meal plan. Do not skip meals. Eat on time.  If you are going to drink alcohol, drink it only with meals.  Check your blood glucose before driving.  Check your blood glucose before and after exercise. If you exercise longer or different than usual, be sure to check blood glucose more frequently.  Always carry treatment with you. Glucose tablets are the  easiest to carry.  Always wear medical alert jewelry or carry some form of identification that states that you have diabetes. This will alert people that you have diabetes. If you have hypoglycemia, they will have a better idea on what to do. SEEK MEDICAL CARE IF:   You are having problems keeping your blood sugar at target range.  You are having frequent episodes of hypoglycemia.  You feel you might be having side effects from your medicines.  You have symptoms of an illness that is not improving after 3-4 days.  You notice a change in vision or a new problem with your vision. SEEK IMMEDIATE MEDICAL CARE IF:   You are a family member or friend of a person whose blood glucose goes below 70 mg/dl and is accompanied by:  Confusion.  A change in mental status.  The inability to swallow.  Passing out. Document Released: 12/08/2005 Document Revised: 03/01/2012 Document Reviewed: 04/05/2012 [facility] Patient Information 2014 [place], [place].

## 2014-03-24 NOTE — Progress Notes (Addendum)
Subjective:  This chart was scribed for Wendie Agreste, MD by Mercy Moore, Medial Scribe. This patient was seen in room 2 and the patient's care was started at 1:52 PM.    Patient ID: Alexander [name], male    DOB: [birth year], 48 y.o.   MRN: [id number]  HPI Alexander [name] is a 48 y.o. male PCP: DAUB, Lina Sayre, MD  HPI Comments: Abscess: Alexander [name] is a 48 y.o. male with history of Diabetes who presents to the [facility] complaining of abscess on his left cheek. Patient says that he cut his facial hair and an ingrown hair started growing in the skin and has progressively worsened. Patient says that the abscess drains yellow and clear fluid tinted with blood. Patient has been treating abscess with drawing salve and hydrogen peroxide. However, after drying the area refills with puss. Patient shares history of similar bumps on the back of his neck when was in highschool, but denies similar bumps on the face. Patient denies history of MRSA. Patient reports that he has been feeling well otherwise.    Diabetes (med refill requested at end of visit - discussion of diabetes) : Prescribed Invokana, Januvia, and Actos by endocrinologist 10/13/13 after referall from Dr. Everlene Farrier 09/17/13. A1C was 11.2 at that time. Medications: Metformin 1000 mg BID and Gilpizide 5 mg. Patient has not followed up with endocrinologist (6 months past 1 month follow up) and requests to see Dr. Everlene Farrier for his diabetes because of issues he had with the referred specialist. Did not feel like he was given any information, just started on meds.  Eye problems back in 09/2013 cleared up.  Home blood sugar readings - 150-180, up to 200.  No symptomatic lows.   Patient Active Problem List   Diagnosis Date Noted  . Impotence of organic origin 09/24/2013  . HTN (hypertension) 12/17/2012  . Diabetes mellitus, type 2 12/17/2012  . Hyperlipidemia 12/17/2012   Past Medical History  Diagnosis Date  . Diabetes mellitus  without complication   . Hypertension   . Hyperlipidemia    History reviewed. No pertinent past surgical history. No Known Allergies Prior to Admission medications   Medication Sig Start Date End Date Taking? Authorizing Provider  Canagliflozin (INVOKANA) 300 MG TABS Take 1 tablet (300 mg total) by mouth daily. 09/23/13  Yes Renato Shin, MD  fluconazole (DIFLUCAN) 150 MG tablet TAKE 1 TABLET (150 MG TOTAL) BY MOUTH ONCE. REPEAT IF NEEDED 05/12/13  Yes Darlyne Russian, MD  glipiZIDE (GLUCOTROL XL) 5 MG 24 hr tablet TAKE 1 TABLET (5 MG TOTAL) BY MOUTH DAILY. 06/07/13  Yes Heather M Marte, PA-C  glipiZIDE (GLUCOTROL XL) 5 MG 24 hr tablet TAKE 1 TABLET (5 MG TOTAL) BY MOUTH DAILY. 10/07/13  Yes [place], PA-C  losartan (COZAAR) 100 MG tablet TAKE 1 TABLET (100 MG TOTAL) BY MOUTH DAILY. 03/07/14  Yes Mancel Bale, PA-C  metFORMIN (GLUCOPHAGE) 1000 MG tablet Take 1 tablet (1,000 mg total) by mouth 2 (two) times daily. 04/14/13  Yes Darlyne Russian, MD  metFORMIN (GLUCOPHAGE) 1000 MG tablet TAKE 1 TABLET (1,000 MG TOTAL) BY MOUTH 2 (TWO) TIMES DAILY. 10/07/13  Yes Heather M Marte, PA-C  metFORMIN (GLUCOPHAGE) 1000 MG tablet Take 1 tablet (1,000 mg total) by mouth 2 (two) times daily. PATIENT NEEDS OFFICE VISIT FOR ADDITIONAL REFILLS - 2nd NOTICE   Yes Darlyne Russian, MD  simvastatin (ZOCOR) 40 MG tablet TAKE 1 TABLET (40 MG TOTAL) BY  MOUTH AT BEDTIME. 03/07/14  Yes Mancel Bale, PA-C  sitaGLIPtin (JANUVIA) 100 MG tablet Take 1 tablet (100 mg total) by mouth daily. 09/23/13  Yes Renato Shin, MD  VIAGRA 100 MG tablet TAKE 1 TABLET BY MOUTH AS NEEDED FOR ERECTILE DYSFUNCTION 12/13/13  Yes Darlyne Russian, MD  pioglitazone (ACTOS) 45 MG tablet Take 1 tablet (45 mg total) by mouth daily. 09/23/13   Renato Shin, MD   History   Social History  . Marital Status: Married    Spouse Name: N/A    Number of Children: N/A  . Years of Education: N/A   Occupational History  . Not on file.   Social History Main  Topics  . Smoking status: Never Smoker   . Smokeless tobacco: Not on file  . Alcohol Use: No  . Drug Use: No  . Sexual Activity: Yes   Other Topics Concern  . Not on file   Social History Narrative  . No narrative on file     Review of Systems  Skin:       Abscess on face.      Objective:   Physical Exam  Nursing note and vitals reviewed. Constitutional: He is oriented to person, place, and time. He appears well-developed and well-nourished. No distress.  HENT:  Head: Normocephalic and atraumatic.  2.5 cm of induration of left face, cheek area with 2 cm of erythema. Profusely indurated. Small area of central fluctuance over open papule with acute drainage No intraoral lesions.   Eyes: EOM are normal.  Neck: Neck supple. No tracheal deviation present.  Cardiovascular: Normal rate.   Pulmonary/Chest: Effort normal. No respiratory distress.  Musculoskeletal: Normal range of motion.  Lymphadenopathy:    He has no cervical adenopathy.  Neurological: He is alert and oriented to person, place, and time.  Skin: Skin is warm and dry. There is erythema.  Psychiatric: He has a normal mood and affect. His behavior is normal.    Filed Vitals:   03/24/14 1317  BP: 130/90  Pulse: 106  Temp: 97.9 F (36.6 C)  TempSrc: Oral  Resp: 16  Height: 5\' 10"  (1.778 m)  Weight: 241 lb (109.317 kg)  SpO2: 97%   Results for orders placed in visit on 03/24/14  GLUCOSE, POCT (MANUAL RESULT ENTRY)      Result Value Ref Range   POC Glucose 273 (*) 70 - 99 mg/dl  POCT GLYCOSYLATED HEMOGLOBIN (HGB A1C)      Result Value Ref Range   Hemoglobin A1C 11.2        Assessment & Plan:  Alexander [name] is a 48 y.o. male  Alexander [name] is a 48 y.o. male Cellulitis and abscess of face - Plan: Wound culture, doxycycline (VIBRA-TABS) 100 MG tablet  DM type 2 (diabetes mellitus, type 2) - Plan: POCT glucose (manual entry), POCT glycosylated hemoglobin (Hb A1C), metFORMIN (GLUCOPHAGE) 1000 MG tablet,  glipiZIDE (GLUCOTROL) 5 MG tablet  Diabetes mellitus, type 2 - Plan: metFORMIN (GLUCOPHAGE) 1000 MG tablet  Type II or unspecified type diabetes mellitus without mention of complication, not stated as uncontrolled - Plan: metFORMIN (GLUCOPHAGE) 1000 MG tablet  DM (diabetes mellitus), type 2, uncontrolled  Abscess - L face.  I and D per procedure note. Start doxycycline, and wound cx obtained. Will have rechecked in 2 days with Dr. Everlene Farrier. Discussed concerns of infection and wound healing with uncontrolled diabetes. RTC/ER precautions.   DM2 - requested metformin refill at end of visit.Uncontrolled. Prior notes reviewed. Identifies Dr.  Daub as primary provider.  Discussed with patient that he was sent to endocrinology for assistance as uncontrolled DM, and reviewed patient instructions from that visit.   Nonadherent to starting new meds prescribed by endocrine (did take Januvia for a month, but may have had some difficulty d/t cost of other 2 meds). Also has not had follow up at 1 month with endocrine or recent follow up here. 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  Attempted to identify barriers in his care - he does not want to return to prior endocrinologist and med costs as above. May still need to see endocrinologist. He wants to discuss options with primary provider, so will have follow up with Dr. Everlene Farrier in 2 days as above.   -continue metformin 1000mg  BID. Increase glipizide to 5mg  BID, and instructed to restart Januvia 100mg  QD as refills available. Hypoglycemic precautions. Bring meter to correlate next ov as reported outside readings do not correlate with HGB A1c or office reading.    Meds ordered this encounter  Medications  . doxycycline (VIBRA-TABS) 100 MG tablet    Sig: Take 1 tablet (100 mg total) by mouth 2 (two) times daily.    Dispense:  20 tablet    Refill:  0  .  metFORMIN (GLUCOPHAGE) 1000 MG tablet    Sig: Take 1 tablet (1,000 mg total) by mouth 2 (two) times daily.    Dispense:  60 tablet    Refill:  2  . glipiZIDE (GLUCOTROL) 5 MG tablet    Sig: Take 1 tablet (5 mg total) by mouth 2 (two) times daily before a meal.    Dispense:  60 tablet    Refill:  2   Patient Instructions  Warm compresses, soap and water to area - no further peroxide.  Antibiotic as discussed. Return to the clinic or go to the nearest emergency room if any of your symptoms worsen or new symptoms occur. You should receive a call or letter about your lab results within the next week to 10 days.   In regards to your diabetes - the endocrinologist had indicated to follow up in 1 month when he saw you in October and new meds were recommended.  I would recommend follow up with endocrinology as Dr. Everlene Farrier recommended, but if you want to return to discuss options further with Dr. Everlene Farrier as requested - he is here Sunday, He can also recheck the infection on the face at that time.   Restart Januvia 100mg  each day(There are refills available for the Januvia - check with your pharmacy and restart this medicine), and will increase glipizide to 5mg  twice per day with meals.  Check your blood sugar at least twice per day this next week and watch for low blood sugar symptoms see the information below.  Carry some form of glucose with you at all times.     Cellulitis Cellulitis is an infection of the skin and the tissue beneath it. The infected area is usually red and tender. Cellulitis occurs most often in the arms and lower legs.  CAUSES  Cellulitis is caused by bacteria that enter the skin through cracks or cuts in the skin. The most common types of bacteria that cause cellulitis are Staphylococcus and Streptococcus. SYMPTOMS   Redness and warmth.  Swelling.  Tenderness or pain.  Fever. DIAGNOSIS  Your caregiver can usually determine what is wrong based on  a physical exam. Blood tests  may also be done. TREATMENT  Treatment usually involves taking an antibiotic medicine. HOME CARE INSTRUCTIONS   Take your antibiotics as directed. Finish them even if you start to feel better.  Keep the infected arm or leg elevated to reduce swelling.  Apply a warm cloth to the affected area up to 4 times per day to relieve pain.  Only take over-the-counter or prescription medicines for pain, discomfort, or fever as directed by your caregiver.  Keep all follow-up appointments as directed by your caregiver. SEEK MEDICAL CARE IF:   You notice red streaks coming from the infected area.  Your red area gets larger or turns dark in color.  Your bone or joint underneath the infected area becomes painful after the skin has healed.  Your infection returns in the same area or another area.  You notice a swollen bump in the infected area.  You develop new symptoms. SEEK IMMEDIATE MEDICAL CARE IF:   You have a fever.  You feel very sleepy.  You develop vomiting or diarrhea.  You have a general ill feeling (malaise) with muscle aches and pains. MAKE SURE YOU:   Understand these instructions.  Will watch your condition.  Will get help right away if you are not doing well or get worse. Document Released: 09/17/2005 Document Revised: 06/08/2012 Document Reviewed: 02/23/2012 [facility] Patient Information 2014 [place].  Hypoglycemia (Low Blood Sugar) Hypoglycemia is when the glucose (sugar) in your blood is too low. Hypoglycemia can happen for many reasons. It can happen to people with or without diabetes. Hypoglycemia can develop quickly and can be a medical emergency.  CAUSES  Having hypoglycemia does not mean that you will develop diabetes. Different causes include:  Missed or delayed meals or not enough carbohydrates eaten.  Medication overdose. This could be by accident or deliberate. If by accident, your medication may need to be adjusted or changed.  Exercise or  increased activity without adjustments in carbohydrates or medications.  A nerve disorder that affects body functions like your heart rate, blood pressure and digestion (autonomic neuropathy).  A condition where the stomach muscles do not function properly (gastroparesis). Therefore, medications may not absorb properly.  The inability to recognize the signs of hypoglycemia (hypoglycemic unawareness).  Absorption of insulin  may be altered.  Alcohol consumption.  Pregnancy/menstrual cycles/postpartum. This may be due to hormones.  Certain kinds of tumors. This is very rare. SYMPTOMS   Sweating.  Hunger.  Dizziness.  Blurred vision.  Drowsiness.  Weakness.  Headache.  Rapid heart beat.  Shakiness.  Nervousness. DIAGNOSIS  Diagnosis is made by monitoring blood glucose in one or all of the following ways:  Fingerstick blood glucose monitoring.  Laboratory results. TREATMENT  If you think your blood glucose is low:  Check your blood glucose, if possible. If it is less than 70 mg/dl, take one of the following:  3-4 glucose tablets.   cup juice (prefer clear like apple).   cup "regular" soda pop.  1 cup milk.  -1 tube of glucose gel.  5-6 hard candies.  Do not over treat because your blood glucose (sugar) will only go too high.  Wait 15 minutes and recheck your blood glucose. If it is still less than 70 mg/dl (or below your target range), repeat treatment.  Eat a snack if it is more than one hour until your next meal. Sometimes, your blood glucose may go so low that you are unable to treat yourself. You may  need someone to help you. You may even pass out or be unable to swallow. This may require you to get an injection of glucagon, which raises the blood glucose. HOME CARE INSTRUCTIONS  Check blood glucose as recommended by your caregiver.  Take medication as prescribed by your caregiver.  Follow your meal plan. Do not skip meals. Eat on time.  If  you are going to drink alcohol, drink it only with meals.  Check your blood glucose before driving.  Check your blood glucose before and after exercise. If you exercise longer or different than usual, be sure to check blood glucose more frequently.  Always carry treatment with you. Glucose tablets are the easiest to carry.  Always wear medical alert jewelry or carry some form of identification that states that you have diabetes. This will alert people that you have diabetes. If you have hypoglycemia, they will have a better idea on what to do. SEEK MEDICAL CARE IF:   You are having problems keeping your blood sugar at target range.  You are having frequent episodes of hypoglycemia.  You feel you might be having side effects from your medicines.  You have symptoms of an illness that is not improving after 3-4 days.  You notice a change in vision or a new problem with your vision. SEEK IMMEDIATE MEDICAL CARE IF:   You are a family member or friend of a person whose blood glucose goes below 70 mg/dl and is accompanied by:  Confusion.  A change in mental status.  The inability to swallow.  Passing out. Document Released: 12/08/2005 Document Revised: 03/01/2012 Document Reviewed: 04/05/2012 [facility] Patient Information 2014 [place], [place].   I personally performed the services described in this documentation, which was scribed in my presence. The recorded information has been reviewed and considered, and addended by me as needed.

## 2014-03-24 NOTE — Progress Notes (Signed)
Procedure Note: Verbal consent obtained.  Local anesthesia with 1 cc 2% lidocaine.  Betadine prep.  Central scab gently lifted with 11 blade.  Copious purulence drained spontaneously with removal of scab.  Further purulence then expressed.  The opening was widened approx 90mm with 11 blade.  Wound irrigated with remaining anesthetic.  Cleansed and dressed.  Discussed wound care.

## 2014-03-26 ENCOUNTER — Ambulatory Visit (INDEPENDENT_AMBULATORY_CARE_PROVIDER_SITE_OTHER): Payer: Managed Care, Other (non HMO) | Admitting: Emergency Medicine

## 2014-03-26 VITALS — BP 118/76 | HR 90 | Temp 98.0°F | Resp 16 | Ht 69.0 in | Wt 243.2 lb

## 2014-03-26 DIAGNOSIS — L0201 Cutaneous abscess of face: Secondary | ICD-10-CM

## 2014-03-26 DIAGNOSIS — R930 Abnormal findings on diagnostic imaging of skull and head, not elsewhere classified: Secondary | ICD-10-CM

## 2014-03-26 DIAGNOSIS — R9089 Other abnormal findings on diagnostic imaging of central nervous system: Secondary | ICD-10-CM

## 2014-03-26 DIAGNOSIS — E119 Type 2 diabetes mellitus without complications: Secondary | ICD-10-CM

## 2014-03-26 DIAGNOSIS — Z22322 Carrier or suspected carrier of Methicillin resistant Staphylococcus aureus: Secondary | ICD-10-CM

## 2014-03-26 DIAGNOSIS — L03211 Cellulitis of face: Principal | ICD-10-CM

## 2014-03-26 LAB — WOUND CULTURE

## 2014-03-26 MED ORDER — SITAGLIPTIN PHOSPHATE 100 MG PO TABS: 100.0000 mg | ORAL_TABLET | Freq: Every day | ORAL | Status: DC

## 2014-03-26 MED ORDER — DOXYCYCLINE HYCLATE 100 MG PO TABS: 100.0000 mg | ORAL_TABLET | Freq: Two times a day (BID) | ORAL | Status: DC

## 2014-03-26 MED ORDER — MUPIROCIN 2 % EX OINT: 1.0000 "application " | TOPICAL_OINTMENT | Freq: Two times a day (BID) | CUTANEOUS | Status: DC

## 2014-03-26 NOTE — Patient Instructions (Signed)
MRSA Overview  MRSA stands for methicillin-resistant Staphylococcus aureus. It is a type of bacteria that is resistant to some common antibiotics. It can cause infections in the skin and many other places in the body. Staphylococcus aureus, often called "staph," is a bacteria that normally lives on the skin or in the nose. Staph on the surface of the skin or in the nose does not cause problems. However, if the staph enters the body through a cut, wound, or break in the skin, an infection can happen.  Up until recently, infections with the MRSA type of staph mainly occurred in hospitals and other healthcare settings. There are now increasing problems with MRSA infections in the community as well. Infections with MRSA may be very serious or even life-threatening. Most MRSA infections are acquired in one of two ways:  · Healthcare-associated MRSA (HA-MRSA)  · This can be acquired by people in any healthcare setting. MRSA can be a big problem for hospitalized people, people in nursing homes, people in rehabilitation facilities, people with weakened immune systems, dialysis patients, and those who have had surgery.  · Community-associated MRSA (CA-MRSA)  · Community spread of MRSA is becoming more common. It is known to spread in crowded settings, in jails and prisons, and in situations where there is close skin-to-skin contact, such as during sporting events or in locker rooms. MRSA can be spread through shared items, such as children's toys, razors, towels, or sports equipment.  CAUSES   All staph, including MRSA, are normally harmless unless they enter the body through a scratch, cut, or wound, such as with surgery. All staph, including MRSA, can be spread from person-to-person by touching contaminated objects or through direct contact.  SPECIAL GROUPS  MRSA can present problems for special groups of people. Some of these groups include:  · Breastfeeding women.  · The most common problem is MRSA infection of the  breast (mastitis). There is evidence that MRSA can be passed to an infant from infected breast milk. Your caregiver may recommend that you stop breastfeeding until the mastitis is under control.  · If you are breastfeeding and have a MRSA infection in a place other than the breast, you may usually continue breastfeeding while under treatment. If taking antibiotics, ask your caregiver if it is safe to continue breastfeeding while taking your prescribed medicines.  · Neonates (babies from birth to 1 month old) and infants (babies from 1 month to 1 year old).  · There is evidence that MRSA can be passed to a newborn at birth if the mother has MRSA on the skin, in or around the birth canal, or an infection in the uterus, cervix, or vagina. MRSA infection can have the same appearance as a normal newborn or infant rash or several other skin infections. This can make it hard to diagnose MRSA.  · Immune compromised people.  · If you have an immune system problem, you may have a higher chance of developing a MRSA infection.  · People after any type of surgery.  · Staph in general, including MRSA, is the most common cause of infections occurring at the site of recent surgery.  · People on long-term steroid medicines.  · These kinds of medicines can lower your resistance to infection. This can increase your chance of getting MRSA.  · People who have had frequent hospitalizations, live in nursing homes or other residential care facilities, have venous or urinary catheters, or have taken multiple courses of antibiotic therapy for any reason.    DIAGNOSIS   Diagnosis of MRSA is done by cultures of fluid samples that may come from:  · Swabs taken from cuts or wounds in infected areas.  · Nasal swabs.  · Saliva or deep cough specimens from the lungs (sputum).  · Urine.  · Blood.  Many people are "colonized" with MRSA but have no signs of infection. This means that people carry the MRSA germ on their skin or in their nose and may  never develop MRSA infection.   TREATMENT   Treatment varies and is based on how serious, how deep, or how extensive the infection is. For example:  · Some skin infections, such as a small boil or abscess, may be treated by draining yellowish-white fluid (pus) from the site of the infection.  · Deeper or more widespread soft tissue infections are usually treated with surgery to drain pus and with antibiotic medicine given by vein or by mouth. This may be recommended even if you are pregnant.  · Serious infections may require a hospital stay.  If antibiotics are given, they may be needed for several weeks.  PREVENTION   Because many people are colonized with staph, including MRSA, preventing the spread of the bacteria from person-to-person is most important. The best way to prevent the spread of bacteria and other germs is through proper hand washing or by using alcohol-based hand disinfectants. The following are other ways to help prevent MRSA infection within the hospital and community settings.   · Healthcare settings:  · Strict hand washing or hand disinfection procedures need to be followed before and after touching every patient.  · Patients infected with MRSA are placed in isolation to prevent the spread of the bacteria.  · Healthcare workers need to wear disposable gowns and gloves when touching or caring for patients infected with MRSA. Visitors may also be asked to wear a gown and gloves.  · Hospital surfaces need to be disinfected frequently.  · Community settings:  · Wash your hands frequently with soap and water for at least 15 seconds. Otherwise, use alcohol-based hand disinfectants when soap and water is not available.  · Make sure people who live with you wash their hands often, too.  · Do not share personal items. For example, avoid sharing razors and other personal hygiene items, towels, clothing, and athletic equipment.  · Wash and dry your clothes and bedding at the warmest temperatures  recommended on the labels.  · Keep wounds covered. Pus from infected sores may contain MRSA and other bacteria. Keep cuts and abrasions clean and covered with germ-free (sterile), dry bandages until they are healed.  · If you have a wound that appears infected, ask your caregiver if a culture for MRSA and other bacteria should be done.  · If you are breastfeeding, talk to your caregiver about MRSA. You may be asked to temporarily stop breastfeeding.  HOME CARE INSTRUCTIONS   · Take your antibiotics as directed. Finish them even if you start to feel better.  · Avoid close contact with those around you as much as possible. Do not use towels, razors, toothbrushes, bedding, or other items that will be used by others.  · To fight the infection, follow your caregiver's instructions for wound care. Wash your hands before and after changing your bandages.  · If you have an intravascular device, such as a catheter, make sure you know how to care for it.  · Be sure to tell any healthcare providers that you have MRSA   so they are aware of your infection.  SEEK IMMEDIATE MEDICAL CARE IF:   · The infection appears to be getting worse. Signs include:  · Increased warmth, redness, or tenderness around the wound site.  · A red line that extends from the infection site.  · A dark color in the area around the infection.  · Wound drainage that is tan, yellow, or green.  · A bad smell coming from the wound.  · You feel sick to your stomach (nauseous) and throw up (vomit) or cannot keep medicine down.  · You have a fever.  · Your baby is older than 3 months with a rectal temperature of 102° F (38.9° C) or higher.  · Your baby is 3 months old or younger with a rectal temperature of 100.4° F (38° C) or higher.  · You have difficulty breathing.  MAKE SURE YOU:   · Understand these instructions.  · Will watch your condition.  · Will get help right away if you are not doing well or get worse.  Document Released: 12/08/2005 Document Revised:  03/01/2012 Document Reviewed: 03/12/2011  [employer]® Patient Information ©2014 [employer].

## 2014-03-26 NOTE — Progress Notes (Signed)
Subjective:    Patient ID: Alexander [name], male    DOB: [birth year], 48 y.o.   MRN: [id number]  Chief Complaint  Patient presents with  . Wound Check    recheck bump left cheek on face from Friday   This chart was scribed for [name], MD by Zettie Pho, ED Scribe.   HPI [name] is a 48 y.o. male with a history of type II DM who presents to [facility] requesting a recheck of a wound to the left check that he sustained 2 days ago by having an abscess in the area incised and drained here. Patient was discharged with antibiotics to treat a developing MRSA, which he states he is still currently taking. Patient is prescribed glipizide, Januvia, and metformin daily for his DM and reports that he has altered his diet over the past year, but reports that his CBG has not been well-controlled lately (last A1c was 11.2). He states that he has not been taking the Januvia because he ran out of his prescription. He reports that he has followed up with Dr. [name] recently and got glasses 2 days ago. Patient also recently had an MRI that indicated abnormal areas of white matter secondary to his DM and he was advised to follow up with a neurologist, which he states he has not done. Patient also has a history of HTN, and hyperlipidemia.   Patient Active Problem List   Diagnosis Date Noted  . Impotence of organic origin 09/24/2013  . HTN (hypertension) 12/17/2012  . Diabetes mellitus, type 2 12/17/2012  . Hyperlipidemia 12/17/2012   Past Medical History  Diagnosis Date  . Diabetes mellitus without complication   . Hypertension   . Hyperlipidemia    Current Outpatient Prescriptions on File Prior to Visit  Medication Sig Dispense Refill  . doxycycline (VIBRA-TABS) 100 MG tablet Take 1 tablet (100 mg total) by mouth 2 (two) times daily.  20 tablet  0  . fluconazole (DIFLUCAN) 150 MG tablet TAKE 1 TABLET (150 MG TOTAL) BY MOUTH ONCE. REPEAT IF NEEDED  2 tablet  0  . glipiZIDE (GLUCOTROL)  5 MG tablet Take 1 tablet (5 mg total) by mouth 2 (two) times daily before a meal.  60 tablet  2  . losartan (COZAAR) 100 MG tablet TAKE 1 TABLET (100 MG TOTAL) BY MOUTH DAILY.  30 tablet  0  . metFORMIN (GLUCOPHAGE) 1000 MG tablet Take 1 tablet (1,000 mg total) by mouth 2 (two) times daily.  60 tablet  2  . simvastatin (ZOCOR) 40 MG tablet TAKE 1 TABLET (40 MG TOTAL) BY MOUTH AT BEDTIME.  30 tablet  0  . sitaGLIPtin (JANUVIA) 100 MG tablet Take 1 tablet (100 mg total) by mouth daily.  30 tablet  11  . VIAGRA 100 MG tablet TAKE 1 TABLET BY MOUTH AS NEEDED FOR ERECTILE DYSFUNCTION  9 tablet  11   No current facility-administered medications on file prior to visit.   No Known Allergies  Review of Systems  Skin: Positive for wound.      Objective:   Physical Exam  CONSTITUTIONAL: Well developed/well nourished HEAD: Normocephalic/atraumatic EYES: EOMI/PERRL ENMT: Mucous membranes moist NECK: supple no meningeal signs SPINE:entire spine nontender CV: S1/S2 noted, no murmurs/rubs/gallops noted LUNGS: Lungs are clear to auscultation bilaterally, no apparent distress ABDOMEN: soft, nontender, no rebound or guarding GU:no cva tenderness NEURO: Pt is awake/alert, moves all extremitiesx4 EXTREMITIES: pulses normal, full ROM SKIN: warm, color normal; 2 cm x  2 cm firm, nodular-like area over the left maxillary area PSYCH: no abnormalities of mood noted  BP 118/76  Pulse 90  Temp(Src) 98 F (36.7 C) (Oral)  Resp 16  Ht 5\' 9"  (1.753 m)  Wt 243 lb 3.2 oz (110.315 kg)  BMI 35.90 kg/m2  SpO2 99%     Assessment & Plan:  1:25 PM- Discussed that the wound appears to be healing well, but that the culture was positive for MRSA and advised patient to finish the course of antibiotics initially prescribed. Discussed the risks of not following up for his ongoing health problems. Will schedule patient with the neurologist to follow up for his abnormal MRI results. Will also schedule patient with a  diabetic nutritionist. Patient will follow up here again in 3 months. Discussed treatment plan with patient at bedside and patient verbalized agreement.   I personally performed the services described in this documentation, which was scribed in my presence. The recorded information has been reviewed and is accurate.

## 2014-04-01 ENCOUNTER — Other Ambulatory Visit: Payer: Self-pay | Admitting: Family Medicine

## 2014-04-07 ENCOUNTER — Ambulatory Visit: Payer: Managed Care, Other (non HMO) | Admitting: Neurology

## 2014-04-21 ENCOUNTER — Telehealth: Payer: Self-pay | Admitting: Neurology

## 2014-04-21 ENCOUNTER — Ambulatory Visit (INDEPENDENT_AMBULATORY_CARE_PROVIDER_SITE_OTHER): Payer: Managed Care, Other (non HMO)

## 2014-04-21 ENCOUNTER — Encounter: Payer: Self-pay | Admitting: Neurology

## 2014-04-21 ENCOUNTER — Ambulatory Visit (INDEPENDENT_AMBULATORY_CARE_PROVIDER_SITE_OTHER): Payer: Managed Care, Other (non HMO) | Admitting: Neurology

## 2014-04-21 VITALS — BP 134/92 | HR 92 | Ht 68.0 in | Wt 241.0 lb

## 2014-04-21 DIAGNOSIS — R930 Abnormal findings on diagnostic imaging of skull and head, not elsewhere classified: Secondary | ICD-10-CM

## 2014-04-21 DIAGNOSIS — R9089 Other abnormal findings on diagnostic imaging of central nervous system: Secondary | ICD-10-CM

## 2014-04-21 DIAGNOSIS — H5310 Unspecified subjective visual disturbances: Secondary | ICD-10-CM

## 2014-04-21 NOTE — Progress Notes (Signed)
Reason for visit: Abnormal MRI brain  Alexander [name] is a 48 y.o. male  History of present illness:  Alexander [name] is a 48 year old right-handed black male with a history of diabetes, hypertension, and dyslipidemia. The patient indicates that he had a visual change in the left eye in September 2014. The patient noted a black spot in the center of the vision. 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 The patient denies any other symptoms such as numbness, or weakness of extremities. He denies balance issues or problems controlling the bowels or the bladder. He does report frequency of urination. He denies neck pain or back pain, and he denies headache. He denies problems with speech or swallowing. The patient underwent a MRI of the brain in October 2014. This showed multiple nonenhancing white matter lesions in the posterior regions. The possibility of cerebrovascular disease or demyelinating disease was entertained. He is sent to this office for an evaluation.  Past Medical History  Diagnosis Date  . Diabetes mellitus without complication   . Hypertension   . Hyperlipidemia     Past Surgical History  Procedure Laterality Date  . Orif hip fracture Right     Family History  Problem Relation Age of Onset  . Diabetes Father   . Heart disease Father     Social history:  reports that he has never smoked. He does not have any smokeless tobacco history on file. He reports that he does not drink alcohol or use illicit drugs.  Medications:  Current Outpatient Prescriptions on File Prior to Visit  Medication Sig Dispense Refill  . glipiZIDE (GLUCOTROL) 5 MG tablet Take 1 tablet (5 mg total) by mouth 2 (two) times daily before a meal.  60 tablet  2  . losartan (COZAAR) 100 MG tablet TAKE 1 TABLET (100 MG TOTAL) BY MOUTH DAILY.  30 tablet  0  . metFORMIN (GLUCOPHAGE) 1000  MG tablet Take 1 tablet (1,000 mg total) by mouth 2 (two) times daily.  60 tablet  2  . mupirocin ointment (BACTROBAN) 2 % Place 1 application into the nose 2 (two) times daily.  22 g  0  . simvastatin (ZOCOR) 40 MG tablet TAKE 1 TABLET (40 MG TOTAL) BY MOUTH AT BEDTIME.  30 tablet  0  . sitaGLIPtin (JANUVIA) 100 MG tablet Take 1 tablet (100 mg total) by mouth daily.  30 tablet  11  . VIAGRA 100 MG tablet TAKE 1 TABLET BY MOUTH AS NEEDED FOR ERECTILE DYSFUNCTION  9 tablet  11   No current facility-administered medications on file prior to visit.     No Known Allergies  ROS:  Out of a complete 14 system review of symptoms, the patient complains only of the following symptoms, and all other reviewed systems are negative.  Visual disturbance  Blood pressure 134/92, pulse 92, height 5\' 8"  (1.727 m), weight 241 lb (109.317 kg).  Physical Exam  General: The patient is alert and cooperative at the time of the examination.  Eyes: Pupils are equal, round, and reactive to light. Discs are flat bilaterally.  Neck: The neck is supple, no carotid bruits are noted.  Respiratory: The respiratory examination is clear.  Cardiovascular: The cardiovascular examination reveals a regular rate and rhythm, no obvious murmurs or rubs are noted.  Skin: Extremities are without significant edema.  Neurologic  Exam  Mental status: The patient is alert and oriented x 3 at the time of the examination. The patient has apparent normal recent and remote memory, with an apparently normal attention span and concentration ability.  Cranial nerves: Facial symmetry is present. There is good sensation of the face to pinprick and soft touch bilaterally. The strength of the facial muscles and the muscles to head turning and shoulder shrug are normal bilaterally. Speech is well enunciated, no aphasia or dysarthria is noted. Extraocular movements are full. Visual fields are full. The tongue is midline, and the patient has  symmetric elevation of the soft palate. No obvious hearing deficits are noted.  Motor: The motor testing reveals 5 over 5 strength of all 4 extremities. Good symmetric motor tone is noted throughout.  Sensory: Sensory testing is intact to pinprick, soft touch, vibration sensation, and position sense on all 4 extremities. No evidence of extinction is noted.  Coordination: Cerebellar testing reveals good finger-nose-finger and heel-to-shin bilaterally.  Gait and station: Gait is normal. Tandem gait is normal. Romberg is negative. No drift is seen.  Reflexes: Deep tendon reflexes are symmetric, but are depressed bilaterally. Toes are downgoing bilaterally.   MRI brain 07/27/2013:  IMPRESSION:  MRI HEAD IMPRESSION  1. No acute intracranial abnormality.  2. Moderate nonspecific cerebral white matter signal changes. Top  differential considerations include accelerated small vessel  ischemia and demyelination. Other considerations are sequelae of  trauma, hypercoagulable state, vasculitis, migraines, or prior  infection.     Assessment/Plan:  1. Abnormal MRI brain  2. Subjective visual disturbance, left eye  The patient will be sent for a visual evoked response test today. The patient will have a workup for cerebrovascular disease and for possible demyelinating disease. The lesions on the brain are nonspecific. He will have a carotid Doppler study, and a MRI of the cervical spine. In the future, lumbar puncture will be contemplated. Further blood work may be needed. He will followup if needed at this point, but we will contact him regarding the results of the above. The patient has multiple risk factors for stroke, and he is on low-dose aspirin at this time.  Addendum: The VER was delayed on the left, consistent with an optic nerve lesion.  Jill Alexanders MD 04/22/2014 8:24 AM  [facility] [address] [place], [place] [address]  Phone [phone]  Fax [phone]

## 2014-04-21 NOTE — Telephone Encounter (Signed)
I called patient. The visual response this shows delay in transmission on the left optic nerve. This would suggest the possibility of demyelinating disease. The patient will undergo MRI evaluation of the cervical spine, and we will consider lumbar puncture at some point in the future. Blood work will need to be done in the future as well.

## 2014-04-21 NOTE — Procedures (Signed)
    History:   Alexander [name] is a 48 year old gentleman with a history of visual alteration in the left eye beginning in September 2014. MRI the brain shows multiple white matter changes, and he is being evaluated for possible optic neuritis, possible demyelinating disease.  Description: The visual evoked response test was performed today using 32 x 32 check sizes. The absolute latencies for the N1 and the P100 wave forms were within normal limits bilaterally, but the P100 latency on the left is in the upper limits of normal, significantly prolonged relative to the right. The amplitudes for the P100 wave forms were also within normal limits bilaterally. The visual acuity was 20/20 OD and 20/20 OS uncorrected.  Impression:  The visual evoked response test above was within normal limits on the right, but the latency on the left is significantly prolonged suggesting conduction delay within the anterior visual pathway on the left. This study is consistent with a mild ischemic or demyelinating lesion within the left optic nerve. Clinical correlation is required.

## 2014-05-04 ENCOUNTER — Other Ambulatory Visit: Payer: Self-pay | Admitting: Physician Assistant

## 2014-05-05 ENCOUNTER — Other Ambulatory Visit: Payer: Self-pay | Admitting: Physician Assistant

## 2014-05-05 NOTE — Telephone Encounter (Signed)
Pt was seen twice in Apr for DM and acute issues, but not specifically for HTN for some time. Can we RF or RTC?

## 2014-05-22 ENCOUNTER — Ambulatory Visit: Payer: Managed Care, Other (non HMO) | Admitting: *Deleted

## 2014-06-03 ENCOUNTER — Other Ambulatory Visit: Payer: Self-pay | Admitting: Physician Assistant

## 2014-07-01 ENCOUNTER — Other Ambulatory Visit: Payer: Self-pay | Admitting: Family Medicine

## 2014-07-01 ENCOUNTER — Other Ambulatory Visit: Payer: Self-pay | Admitting: Physician Assistant

## 2014-07-03 ENCOUNTER — Other Ambulatory Visit: Payer: Managed Care, Other (non HMO)

## 2014-07-10 ENCOUNTER — Ambulatory Visit: Payer: Managed Care, Other (non HMO) | Admitting: *Deleted

## 2014-07-19 ENCOUNTER — Telehealth: Payer: Self-pay | Admitting: Radiology

## 2014-07-27 ENCOUNTER — Other Ambulatory Visit: Payer: Self-pay | Admitting: Family Medicine

## 2014-08-08 ENCOUNTER — Other Ambulatory Visit: Payer: Self-pay | Admitting: Physician Assistant

## 2014-08-20 ENCOUNTER — Other Ambulatory Visit: Payer: Self-pay | Admitting: Physician Assistant

## 2014-08-20 ENCOUNTER — Other Ambulatory Visit: Payer: Self-pay | Admitting: Family Medicine

## 2014-08-21 ENCOUNTER — Ambulatory Visit (INDEPENDENT_AMBULATORY_CARE_PROVIDER_SITE_OTHER): Payer: Managed Care, Other (non HMO) | Admitting: Family Medicine

## 2014-08-21 VITALS — BP 134/82 | HR 85 | Temp 97.5°F | Resp 18 | Ht 70.0 in | Wt 236.0 lb

## 2014-08-21 DIAGNOSIS — E119 Type 2 diabetes mellitus without complications: Secondary | ICD-10-CM

## 2014-08-21 DIAGNOSIS — R739 Hyperglycemia, unspecified: Secondary | ICD-10-CM

## 2014-08-21 DIAGNOSIS — N529 Male erectile dysfunction, unspecified: Secondary | ICD-10-CM

## 2014-08-21 DIAGNOSIS — I10 Essential (primary) hypertension: Secondary | ICD-10-CM

## 2014-08-21 DIAGNOSIS — R7309 Other abnormal glucose: Secondary | ICD-10-CM

## 2014-08-21 DIAGNOSIS — E1310 Other specified diabetes mellitus with ketoacidosis without coma: Secondary | ICD-10-CM

## 2014-08-21 DIAGNOSIS — R9089 Other abnormal findings on diagnostic imaging of central nervous system: Secondary | ICD-10-CM

## 2014-08-21 DIAGNOSIS — R930 Abnormal findings on diagnostic imaging of skull and head, not elsewhere classified: Secondary | ICD-10-CM

## 2014-08-21 DIAGNOSIS — E785 Hyperlipidemia, unspecified: Secondary | ICD-10-CM

## 2014-08-21 LAB — LIPID PANEL
CHOL/HDL RATIO: 2.8 ratio
CHOLESTEROL: 161 mg/dL (ref 0–200)
HDL: 58 mg/dL (ref >39)
LDL Cholesterol: 74 mg/dL (ref 0–99)
Triglycerides: 145 mg/dL (ref <150)
VLDL: 29 mg/dL (ref 0–40)

## 2014-08-21 LAB — COMPREHENSIVE METABOLIC PANEL
ALT: 21 U/L (ref 0–53)
AST: 14 U/L (ref 0–37)
Albumin: 4.6 g/dL (ref 3.5–5.2)
Alkaline Phosphatase: 47 U/L (ref 39–117)
BUN: 13 mg/dL (ref 6–23)
CO2: 25 mEq/L (ref 19–32)
CREATININE: 0.84 mg/dL (ref 0.50–1.35)
Calcium: 9.7 mg/dL (ref 8.4–10.5)
Chloride: 100 mEq/L (ref 96–112)
Glucose, Bld: 272 mg/dL — ABNORMAL HIGH (ref 70–99)
Potassium: 4.5 mEq/L (ref 3.5–5.3)
Sodium: 134 mEq/L — ABNORMAL LOW (ref 135–145)
Total Bilirubin: 0.8 mg/dL (ref 0.2–1.2)
Total Protein: 7.6 g/dL (ref 6.0–8.3)

## 2014-08-21 LAB — POCT GLYCOSYLATED HEMOGLOBIN (HGB A1C): HEMOGLOBIN A1C: 10.1

## 2014-08-21 LAB — GLUCOSE, POCT (MANUAL RESULT ENTRY): POC GLUCOSE: 274 mg/dL — AB (ref 70–99)

## 2014-08-21 MED ORDER — SIMVASTATIN 40 MG PO TABS: 40.0000 mg | ORAL_TABLET | Freq: Every day | ORAL | Status: DC

## 2014-08-21 MED ORDER — GLIPIZIDE 10 MG PO TABS: 10.0000 mg | ORAL_TABLET | Freq: Two times a day (BID) | ORAL | Status: DC

## 2014-08-21 MED ORDER — METFORMIN HCL 1000 MG PO TABS: 1000.0000 mg | ORAL_TABLET | Freq: Two times a day (BID) | ORAL | Status: DC

## 2014-08-21 MED ORDER — LOSARTAN POTASSIUM 100 MG PO TABS: 100.0000 mg | ORAL_TABLET | Freq: Every day | ORAL | Status: DC

## 2014-08-21 MED ORDER — SITAGLIPTIN PHOSPHATE 100 MG PO TABS: 100.0000 mg | ORAL_TABLET | Freq: Every day | ORAL | Status: DC

## 2014-08-21 NOTE — Patient Instructions (Signed)
Your blood sugar is still high here, and not correlating with the readings on your machine. 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    Increase the glipizide to 10mg  twice per day. Continue Januvia, and metformin at same doses. Watch for low blood sugar symptoms as this is an increase in your medication, but based on your 3 month average should not drop low with his . Bring your meter with readings to next office visit to make sure it is reading effectively  Return to see Dr. Everlene Farrier in the next 6 weeks to discuss blood sugar control. Follow up for MRI of your cervical spine as planned by neurology and follow up with your eye care provider.    Improved blood sugar control should help difficulty with erections, but will need to discuss with Dr. Everlene Farrier if iti is safe for you to take the Viagra - he can refill this medicine if he feels comfortable doing so.

## 2014-08-21 NOTE — Progress Notes (Addendum)
Subjective:    Patient ID: Alexander [name], male    DOB: [birth year], 48 y.o.   MRN: [id number] This chart was scribed for Merri Ray, MD by Cathie Hoops, ED Scribe. The patient was seen in Room 11. The patient's care was started at 10:45 AM. \  Authored by Janeann Forehand, MD unable to change in [facility].   08/21/2014  rx refills   HPI HPI Comments: Alexander [name] is a 48 y.o. male who presents to the [facility] for a medication refill.  1.) DM2 Pt reports he regularly checks his blood sugar at home and it is normally ranges from 120-150. Pt reports his lowest blood sugar reading was 92 couple weeks ago. Pt denies symptomatic low blood sugar. Pt  Pt reports he is compliant with his Glucotrol, Januvia and Metformin. Pt denies he has missed any doses of those. Pt reports he has seen the diabetic nutritionist once and plans to go to his follow-up visit on 9/8. Pt reports a decrease in his weight from 241 in April to 236. Pt reports he saw an ophthalmologist late last year.   Uncontrolled at last visit in April with an A1c of 11.2 and Blood sugar: 273 in office. When I saw him in April he had been referred to endocrinology previously but non-adherent to new medication prescribed by endocrinologist (did not take Januvia- only taken for one month). Options discussed for improving diabetes at that visit but wanted to discuss options with his primary doctor, Dr. Everlene Farrier. I did instruct him to restart Januvia 100 MG q.i.d, Glucotrol 5 MG BID, continued Metformin 1000 mg BID. He was seen by Dr. Everlene Farrier two days later and was referred to a diabetic nutritionist with plan for follow-up 3 months later. Next appointment scheduled with nutritionist with [facility] is September 8.  2.) Hyperlipidemia Pt denies no new muscle aches since starting Zocor. Pt reports his father had hx of heart disease starting at age 62. Pt reports he had a MI at 70. Pt states he had EKGs and stress test within the past several  years.   Lipids within normal lipids in April 2014. Takes simvastatin 40 mg q.d.   3.) Abnormal MRI of Brain Pt denies he has gotten a C-spine MRI and is still waiting to schedule an appointment. Pt denies having an appointment with Dr. Jannifer Franklin. Pt denies weakness in arms or legs, headaches, blurry vision or vision changes.  Abnormal MRI of Brain from October 2014. Non-specific cerebral white matter signal changes. Sent to neurology, Dr. Jannifer Franklin, for eval in may of this year. He had a visual evoked potential test May 1 with conduction delay on the left visual pathway concerning for mild ischemic or demyelinating within the left optic nerve. Plan for MRI of the cervical spine to look for left demyelinating disease. Future order seen in [facility] but has not been done yet.   4.) Erectile Dysfunction Pt denies hearing loss or flushing.  Per endocrinology notes, thought this might improve with glycemic control was prescribed Viagra in December 2014 by Dr. Everlene Farrier.  Here for multiple medication refills. PCP is Dr. Everlene Farrier.   Review of Systems  Constitutional: Negative for fatigue and unexpected weight change.  Eyes: Negative for visual disturbance.  Respiratory: Negative for cough and shortness of breath.   Cardiovascular: Negative for chest pain.  Gastrointestinal: Negative for abdominal pain and blood in stool.  Neurological: Negative for dizziness, weakness, numbness and headaches.    Past Medical History  Diagnosis Date   Diabetes mellitus without complication    Hypertension    Hyperlipidemia    Past Surgical History  Procedure Laterality Date   Orif hip fracture Right    No Known Allergies Current Outpatient Prescriptions  Medication Sig Dispense Refill   aspirin 81 MG tablet Take 81 mg by mouth every other day.       glipiZIDE (GLUCOTROL) 5 MG tablet Take 1 tablet (5 mg total) by mouth 2 (two) times daily before a meal.  60 tablet  2   losartan (COZAAR) 100 MG tablet Take 1 tablet  (100 mg total) by mouth daily. NO MORE REFILLS WITHOUT OFFICE VISIT/CHECK-UP  15 tablet  0   metFORMIN (GLUCOPHAGE) 1000 MG tablet TAKE 1 TABLET BY MOUTH TWICE A DAY  60 tablet  0   mupirocin ointment (BACTROBAN) 2 % Place 1 application into the nose 2 (two) times daily.  22 g  0   simvastatin (ZOCOR) 40 MG tablet Take 1 tablet (40 mg total) by mouth daily. PATIENT NEEDS OFFICE VISIT/LABS FOR ADDITIONAL REFILLS - 2nd NOTICE  15 tablet  0   sitaGLIPtin (JANUVIA) 100 MG tablet Take 1 tablet (100 mg total) by mouth daily.  30 tablet  11   VIAGRA 100 MG tablet TAKE 1 TABLET BY MOUTH AS NEEDED FOR ERECTILE DYSFUNCTION  9 tablet  11   No current facility-administered medications for this visit.       Objective:  Physical Exam  Vitals reviewed. Constitutional: He is oriented to person, place, and time. He appears well-developed and well-nourished.  HENT:  Head: Normocephalic and atraumatic.  Eyes: EOM are normal. Pupils are equal, round, and reactive to light.  Neck: No JVD present. Carotid bruit is not present.  Cardiovascular: Normal rate, regular rhythm and normal heart sounds.   No murmur heard. Pulmonary/Chest: Effort normal and breath sounds normal. He has no rales.  Musculoskeletal: He exhibits no edema.  Neurological: He is alert and oriented to person, place, and time.  Microfilament testing normal in the feet.   Skin: Skin is warm and dry.  Psychiatric: He has a normal mood and affect.    Filed Vitals:   08/21/14 0948  BP: 134/82  Pulse: 85  Temp: 97.5 F (36.4 C)  TempSrc: Oral  Resp: 18  Height: 5\' 10"  (1.778 m)  Weight: 236 lb (107.049 kg)  SpO2: 99%   Results for orders placed in visit on 08/21/14  GLUCOSE, POCT (MANUAL RESULT ENTRY)      Result Value Ref Range   POC Glucose 274 (*) 70 - 99 mg/dl  POCT GLYCOSYLATED HEMOGLOBIN (HGB A1C)      Result Value Ref Range   Hemoglobin A1C 10.1      Assessment & Plan:  10:57 AM- Patient informed of current plan  for treatment and evaluation and agrees with plan at this time.  Alexander [name] is a 48 y.o. male  Hyperlipidemia - Plan: Comprehensive metabolic panel, Lipid panel, simvastatin (ZOCOR) 40 MG tablet  - tolerating zocor. Lipids pending.   Type 2 diabetes mellitus without complication - Plan: glipiZIDE (GLUCOTROL) 10 MG tablet, metFORMIN (GLUCOPHAGE) 1000 MG tablet, sitaGLIPtin (JANUVIA) 100 MG tablet  - still uncontrolled and as in past his home radings do not correlate with A1c or reading in office. Increase glipizide to 10mg  qd, cont metformin and januvia at current doses. Hypoglycemic precautions discussed as increasing meds. rtc in next 6 weeks to see Dr. Everlene Farrier with home readings and actual meter to  correlate with readings in office. RTC precautions discussed. Advised of DOT standards.   Abnormal finding on MRI of brain  Possible demyelinating disease, but due for C spine MRI as scheduled by neuro - advised him to follow up on this to have this done. No focal weakness or change in sx's at present. Denies vision sx's. 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    Essential hypertension - Plan: losartan (COZAAR) 100 MG tablet  Stable. No new changes.  ED - I declined refill of Viagra today as uncontrolled DM, FH of heart disease in father in 50's and unknown if Mr. Burkemper has had cardiac evaluation. Discussed that improved glycemic control should help ED, but will defer refill of his Viagra to his PCP- Dr. Everlene Farrier. Copy of note sent to him.   Meds ordered this encounter  Medications   glipiZIDE (GLUCOTROL) 10 MG tablet    Sig: Take 1 tablet (10 mg total) by mouth 2 (two) times daily before a meal.    Dispense:  60 tablet    Refill:  2   metFORMIN (GLUCOPHAGE) 1000 MG tablet    Sig: Take 1 tablet (1,000 mg total) by mouth 2 (two) times daily with a meal.    Dispense:  60 tablet    Refill:  2   sitaGLIPtin (JANUVIA) 100  MG tablet    Sig: Take 1 tablet (100 mg total) by mouth daily.    Dispense:  30 tablet    Refill:  2   simvastatin (ZOCOR) 40 MG tablet    Sig: Take 1 tablet (40 mg total) by mouth daily.    Dispense:  30 tablet    Refill:  2   losartan (COZAAR) 100 MG tablet    Sig: Take 1 tablet (100 mg total) by mouth daily.    Dispense:  30 tablet    Refill:  2   Patient Instructions  Your blood sugar is still high here, and not correlating with the readings on your machine. 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    Increase the glipizide to 10mg  twice per day. Continue Januvia, and metformin at same doses. Watch for low blood sugar symptoms as this is an increase in your medication, but based on your 3 month average should not drop low with his . Bring your meter with readings to next office visit to make sure it is reading effectively  Return to see Dr. Everlene Farrier in the next 6 weeks to discuss blood sugar control. Follow up for MRI of your cervical spine as planned by neurology and follow up with your eye care provider.    Improved blood sugar control should help difficulty with erections, but will need to discuss with Dr. Everlene Farrier if iti is safe for you to take the Viagra - he can refill this medicine if he feels comfortable doing so.

## 2014-08-29 ENCOUNTER — Encounter: Payer: Managed Care, Other (non HMO) | Attending: Emergency Medicine | Admitting: *Deleted

## 2014-08-29 ENCOUNTER — Encounter: Payer: Self-pay | Admitting: *Deleted

## 2014-08-29 DIAGNOSIS — E119 Type 2 diabetes mellitus without complications: Secondary | ICD-10-CM | POA: Insufficient documentation

## 2014-08-29 DIAGNOSIS — Z713 Dietary counseling and surveillance: Secondary | ICD-10-CM | POA: Insufficient documentation

## 2014-08-29 NOTE — Progress Notes (Signed)
Appt start time: 0830 end time:  1000.  Assessment:  Patient was seen on  08/29/14 for individual diabetes education. Alexander [name] has been diagnosed with diabetes for over 10 years, but has never received any formal education on diabetes management.  He tries to eat healthy and exercise; he recently joined [employer], but he has some misinformation about what foods affect his blood sugar and he has trouble with portion control.   Patient Education Plan per assessed needs and concerns is to attend individual session for Diabetes Self Management Education.  Current HbA1c: 10.1%  Preferred Learning Style:   Auditory  Learning Readiness:   Change in progress  MEDICATIONS: see list.  Metformin and Januvia for diabetes  DIETARY INTAKE:  Usual eating pattern includes 3 meals and 3 snacks per day.  Everyday foods include proteins, starches, fruits, vegetables, sometimes sweets.  Avoided foods include none.    24-hr recall:  B ( AM): banana, apple or 2 eggs, 2 strips bacon or grilled chicken biscuit (doen'st always eat the bread)  Snk ( AM): apple or skinny popcorn or 100 calorie pack  L ( PM): cracker barrel meat and vegetables; or 6in sub on wheat bread ([place] or ham) Snk ( PM): applesauce, peanuts D ( PM): salad or grilled chicken or fish with vegetables Snk ( PM): cookie or ice cream from [employer] sometimes Beverages: water (inadequate)- stopped soda, sometimes OJ, unsweet tea with splenda  Usual physical activity: walk 30 minute each day  Estimated energy needs: 2000 calories 225 g carbohydrates 150 g protein 56 g fat  Progress Towards Goal(s):  In progress.   Nutritional Diagnosis:  NB-1.1 Food and nutrition-related knowledge deficit As related to proper balance of fats, carbohydrates, proteins needed for glycemic controll for diabetes.  As evidenced by HbA1c of 10.1%.    Intervention:  Nutrition counseling provided.  Discussed diabetes disease process and treatment  options.  Discussed physiology of diabetes and role of obesity on insulin resistance.  Encouraged moderate weight reduction to improve glucose levels.  Discussed role of medications and diet in glucose control  Provided education on macronutrients on glucose levels.  Provided education on carb counting, importance of regularly scheduled meals/snacks, and meal planning  Discussed effects of physical activity on glucose levels and long-term glucose control.  Recommended 150 minutes of physical activity/week.  Reviewed patient medications.  Discussed role of medication on blood glucose and possible side effects  Discussed blood glucose monitoring and interpretation.  Discussed recommended target ranges and individual ranges.    Described short-term complications: hyper- and hypo-glycemia.  Discussed causes,symptoms, and treatment options.  Discussed the role of prolonged elevated glucose levels on body systems.    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

## 2014-11-11 ENCOUNTER — Other Ambulatory Visit: Payer: Self-pay | Admitting: Family Medicine

## 2014-11-18 ENCOUNTER — Other Ambulatory Visit: Payer: Self-pay | Admitting: Family Medicine

## 2014-11-25 ENCOUNTER — Other Ambulatory Visit: Payer: Self-pay | Admitting: Family Medicine

## 2014-11-27 ENCOUNTER — Encounter: Payer: Managed Care, Other (non HMO) | Attending: Emergency Medicine | Admitting: *Deleted

## 2014-11-27 DIAGNOSIS — Z713 Dietary counseling and surveillance: Secondary | ICD-10-CM | POA: Insufficient documentation

## 2014-11-27 DIAGNOSIS — E119 Type 2 diabetes mellitus without complications: Secondary | ICD-10-CM

## 2014-11-27 NOTE — Progress Notes (Signed)
Appointment start time: 0830  Appointment end time: 0900  Assessment: Alexander [name] is here for follow up nutrition counseling pertaining to diabetes.  He reports making several lifestyle changes: Got protion spoon at home and is protioning out his starches.  he has cut back on bread and eats the biscuit top, not both top and bottom, or no biscuit at all.  He is trying to count his carbs and is staying around 45-60 g/meal.  He is also doing Weight Watchers He checks his glucose sometimes in the mornings and it runs high (150-180 in the morning and 200 in the evening)  He tries to walk 30 minutes most days.  He has not been back to the doctor for a new A1c; his last result as 10.0% which equates to EAG of ~250 mg/dl.  It sounds like his new normal is 150-200 mg/dl, which is an improvement, although still not good control  24 hour recall B: banana or orange, protein shake; vegetable omlette, or eggs and bacon or grits or oatmeal S: 100 calorie pack L: salad or grilled chicken and vegetables S: skinny popcorn or fruit or   Ham and cheese wrap or P3 packets D: grilled meat and vegetables an some starch (grilled fish, turnip greens and green beans and pinto) S: might have skinny popcorn or graham crakcers  Sometimes craves fried chicken or fish.  He wil have only 1-2 pieces and be good.  He states he has lost 5 pounds.    Physical activity: walks 30-45 minutes most days  Progress Towards Goal(s): some progress.  Nutritional Diagnosis:  NB-1.1 Food and nutrition-related knowledge deficit As related to proper balance of fats, carbohydrates, proteins needed for glycemic controll for diabetes. As evidenced by HbA1c of 10.1%.  Intervention:  Praised Water quality scientist for the changes he has made. Suggested monitoring glucose b.i.d. to see where the issue lies. Also referred back to medical provider for new A1c level. Suggested 60 minutes physical activity, including some resistance training when  able  Monitoring:  Patient will follow up in 3 months

## 2014-12-08 ENCOUNTER — Other Ambulatory Visit: Payer: Self-pay | Admitting: Physician Assistant

## 2014-12-08 NOTE — Telephone Encounter (Signed)
Spoke with patient, states he is good on him medication now, is out of town this week.  Going to come in next week for a follow up to renew medications.

## 2014-12-21 ENCOUNTER — Other Ambulatory Visit: Payer: Self-pay | Admitting: Family Medicine

## 2014-12-22 ENCOUNTER — Other Ambulatory Visit: Payer: Self-pay | Admitting: Family Medicine

## 2014-12-22 ENCOUNTER — Other Ambulatory Visit: Payer: Self-pay | Admitting: Emergency Medicine

## 2014-12-22 NOTE — Telephone Encounter (Signed)
I declined refill of Viagra at last ov with me on August 31st  as uncontrolled DM, FH of heart disease in father in 33's and unknown if he has had cardiac evaluation. Discussed that improved glycemic control should help ED, but deferred refill of his Viagra to his PCP- Dr. Everlene Farrier.  He is overdue for diabetes follow up, so recommend he follow up with Dr. Everlene Farrier in next 2 weeks.  Can discuss Viagra with Dr. Everlene Farrier. Refill encounter on on 12/08/14 stated he planned on office visit in 1 week.

## 2014-12-23 ENCOUNTER — Ambulatory Visit (INDEPENDENT_AMBULATORY_CARE_PROVIDER_SITE_OTHER): Payer: Managed Care, Other (non HMO) | Admitting: Family Medicine

## 2014-12-23 VITALS — BP 142/92 | HR 102 | Temp 97.8°F | Resp 18 | Ht 69.0 in | Wt 239.0 lb

## 2014-12-23 DIAGNOSIS — I10 Essential (primary) hypertension: Secondary | ICD-10-CM

## 2014-12-23 DIAGNOSIS — R938 Abnormal findings on diagnostic imaging of other specified body structures: Secondary | ICD-10-CM

## 2014-12-23 DIAGNOSIS — R9389 Abnormal findings on diagnostic imaging of other specified body structures: Secondary | ICD-10-CM

## 2014-12-23 DIAGNOSIS — N529 Male erectile dysfunction, unspecified: Secondary | ICD-10-CM

## 2014-12-23 DIAGNOSIS — E119 Type 2 diabetes mellitus without complications: Secondary | ICD-10-CM

## 2014-12-23 DIAGNOSIS — E785 Hyperlipidemia, unspecified: Secondary | ICD-10-CM

## 2014-12-23 LAB — GLUCOSE, POCT (MANUAL RESULT ENTRY): POC Glucose: 318 mg/dl — AB (ref 70–99)

## 2014-12-23 LAB — POCT GLYCOSYLATED HEMOGLOBIN (HGB A1C): HEMOGLOBIN A1C: 11.1

## 2014-12-23 MED ORDER — SITAGLIPTIN PHOS-METFORMIN HCL 50-1000 MG PO TABS: 1.0000 | ORAL_TABLET | Freq: Two times a day (BID) | ORAL | Status: DC

## 2014-12-23 MED ORDER — GLIPIZIDE 10 MG PO TABS: 10.0000 mg | ORAL_TABLET | Freq: Two times a day (BID) | ORAL | Status: DC

## 2014-12-23 MED ORDER — METFORMIN HCL 1000 MG PO TABS: ORAL_TABLET | ORAL | Status: DC

## 2014-12-23 MED ORDER — SIMVASTATIN 40 MG PO TABS: ORAL_TABLET | ORAL | Status: DC

## 2014-12-23 MED ORDER — SILDENAFIL CITRATE 100 MG PO TABS
ORAL_TABLET | ORAL | Status: DC
Start: 2014-12-23 — End: 2014-12-23

## 2014-12-23 MED ORDER — CANAGLIFLOZIN 100 MG PO TABS: 100.0000 mg | ORAL_TABLET | Freq: Every day | ORAL | Status: DC

## 2014-12-23 MED ORDER — LOSARTAN POTASSIUM 100 MG PO TABS: ORAL_TABLET | ORAL | Status: DC

## 2014-12-23 NOTE — Progress Notes (Signed)
Subjective:    Patient ID: Alexander [name], male    DOB: 09-Jul-1966, 49 y.o.   MRN: [id number]  HPI Patient presents for medication refills for glipizide, metformin, januvia, losartan, sildenafil, and simvastatin. Has not missed any doses of medication and all meds have been well tolerated. Home BP was high yesterday after eating a salty meal and was 109 systolicly. Numbers previously have been in the 140s. Home glucose in the morning has been 150s-200s. Recently had second appt with nutritionist and has been trying to implement changes as he is watching his portion sizes and eating more salads, veggies, and fruits. Admits to eating up until midnight often. 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 Denies SOB, CP, or edema. NKDA.   Review of Systems  Constitutional: Negative for activity change and fatigue.  Eyes: Negative for visual disturbance.  Respiratory: Negative for cough and shortness of breath.   Cardiovascular: Negative for chest pain, palpitations and leg swelling.  Gastrointestinal: Negative for nausea, vomiting, abdominal pain and diarrhea.  Neurological: Negative for dizziness, light-headedness and headaches.       Objective:   Physical Exam  Constitutional: He is oriented to person, place, and time. He appears well-developed and well-nourished. No distress.  Blood pressure 142/92, pulse 102, temperature 97.8 F (36.6 C), resp. rate 18, height 5\' 9"  (1.753 m), weight 239 lb (108.41 kg), SpO2 98 %.  HENT:  Head: Normocephalic and atraumatic.  Right Ear: External ear normal.  Left Ear: External ear normal.  Nose: Nose normal.  Mouth/Throat: Oropharynx is clear and moist.  Eyes: Conjunctivae are normal. Pupils are equal, round, and reactive to light. Right eye exhibits no discharge. Left eye exhibits no discharge. No scleral icterus.  Neck: Neck supple. No thyromegaly present.  Cardiovascular: Normal rate,  regular rhythm and intact distal pulses.  Exam reveals no gallop and no friction rub.   No murmur heard. Pulmonary/Chest: Effort normal and breath sounds normal. He has no wheezes. He has no rales.  Abdominal: Soft. Bowel sounds are normal. There is no tenderness.  Lymphadenopathy:    He has no cervical adenopathy.  Neurological: He is alert and oriented to person, place, and time.  Skin: Skin is warm and dry. No rash noted. He is not diaphoretic. No erythema. No pallor.   Results for orders placed or performed in visit on 12/23/14  POCT glucose (manual entry)  Result Value Ref Range   POC Glucose 318 (A) 70 - 99 mg/dl  POCT glycosylated hemoglobin (Hb A1C)  Result Value Ref Range   Hemoglobin A1C 11.1    Wt Readings from Last 3 Encounters:  12/23/14 239 lb (108.41 kg)  08/21/14 236 lb (107.049 kg)  04/21/14 241 lb (109.317 kg)       Assessment & Plan:  1. Type 2 diabetes mellitus without complication Uncontrolled. Ha1C increased from 10.1 to 11.1 in 4 months. 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 Added Invokana to regimen and combining Januvia and Metformin. Discussed increasing and diversifying exercise regimen and foods to avoid as a diabetic. - POCT glucose (manual entry) - POCT glycosylated hemoglobin (Hb A1C) - glipiZIDE (GLUCOTROL) 10 MG tablet; Take 1 tablet (10 mg total) by mouth 2 (two) times daily before a meal. PATIENT NEEDS DIABETES CHECKUP FOR ADDITIONAL REFILLS  Dispense: 60 tablet; Refill: 1 - sitaGLIPtin-metformin (JANUMET) 50-1000 MG per tablet; Take 1 tablet by mouth  2 (two) times daily with a meal.  Dispense: 60 tablet; Refill: 1 - canagliflozin (INVOKANA) 100 MG TABS tablet; Take 1 tablet (100 mg total) by mouth daily.  Dispense: 30 tablet; Refill: 0 - Ambulatory referral to Cardiology  2. Essential hypertension - losartan (COZAAR) 100 MG tablet; TAKE 1 TABLET (100 MG TOTAL) BY MOUTH DAILY.  Dispense: 30 tablet; Refill: 2 - Ambulatory  referral to Cardiology  3. Erectile dysfunction, unspecified erectile dysfunction type Will hold off on refilling Viagra until after stress testing.   4. Hyperlipidemia - simvastatin (ZOCOR) 40 MG tablet; TAKE 1 TABLET (40 MG TOTAL) BY MOUTH DAILY.  Dispense: 30 tablet; Refill: 2 - Ambulatory referral to Cardiology  5. Abnormal MRI - Ambulatory referral to Neurology    Alveta Heimlich PA-C  [facility] 12/23/2014 1:11 PM

## 2014-12-23 NOTE — Progress Notes (Signed)
Patient discussed with Ms. Ricki Miller. Agree with assessment and plan of care per her note.

## 2014-12-23 NOTE — Patient Instructions (Signed)
Exercise to Lose Weight Exercise and a healthy diet may help you lose weight. Your doctor may suggest specific exercises. EXERCISE IDEAS AND TIPS  Choose low-cost things you enjoy doing, such as walking, bicycling, or exercising to workout videos.  Take stairs instead of the elevator.  Walk during your lunch break.  Park your car further away from work or school.  Go to a gym or an exercise class.  Start with 5 to 10 minutes of exercise each day. Build up to 30 minutes of exercise 4 to 6 days a week.  Wear shoes with good support and comfortable clothes.  Stretch before and after working out.  Work out until you breathe harder and your heart beats faster.  Drink extra water when you exercise.  Do not do so much that you hurt yourself, feel dizzy, or get very short of breath. Exercises that burn about 150 calories:  Running 1  miles in 15 minutes.  Playing volleyball for 45 to 60 minutes.  Washing and waxing a car for 45 to 60 minutes.  Playing touch football for 45 minutes.  Walking 1  miles in 35 minutes.  Pushing a stroller 1  miles in 30 minutes.  Playing basketball for 30 minutes.  Raking leaves for 30 minutes.  Bicycling 5 miles in 30 minutes.  Walking 2 miles in 30 minutes.  Dancing for 30 minutes.  Shoveling snow for 15 minutes.  Swimming laps for 20 minutes.  Walking up stairs for 15 minutes.  Bicycling 4 miles in 15 minutes.  Gardening for 30 to 45 minutes.  Jumping rope for 15 minutes.  Washing windows or floors for 45 to 60 minutes. Document Released: 01/10/2011 Document Revised: 03/01/2012 Document Reviewed: 01/10/2011 [facility] Patient Information 2015 [place], [place]. This information is not intended to replace advice given to you by your health care provider. Make sure you discuss any questions you have with your health care provider.    Why follow it? Research shows. . Those who follow the Mediterranean diet have a reduced risk of  heart disease  . The diet is associated with a reduced incidence of Parkinson's and Alzheimer's diseases . People following the diet may have longer life expectancies and lower rates of chronic diseases  . The Dietary Guidelines for Americans recommends the Mediterranean diet as an eating plan to promote health and prevent disease  What Is the Mediterranean Diet?  . Healthy eating plan based on typical foods and recipes of Mediterranean-style cooking . The diet is primarily a plant based diet; these foods should make up a majority of meals   Starches - Plant based foods should make up a majority of meals - They are an important sources of vitamins, minerals, energy, antioxidants, and fiber - Choose whole grains, foods high in fiber and minimally processed items  - Typical grain sources include wheat, oats, barley, corn, brown rice, bulgar, farro, millet, polenta, couscous  - Various types of beans include chickpeas, lentils, fava beans, black beans, white beans   Fruits  Veggies - Large quantities of antioxidant rich fruits & veggies; 6 or more servings  - Vegetables can be eaten raw or lightly drizzled with oil and cooked  - Vegetables common to the traditional Mediterranean Diet include: artichokes, arugula, beets, broccoli, brussel sprouts, cabbage, carrots, celery, collard greens, cucumbers, eggplant, kale, leeks, lemons, lettuce, mushrooms, okra, onions, peas, peppers, potatoes, pumpkin, radishes, rutabaga, shallots, spinach, sweet potatoes, turnips, zucchini - Fruits common to the Mediterranean Diet include: apples, apricots, avocados,  cherries, clementines, dates, figs, grapefruits, grapes, melons, nectarines, oranges, peaches, pears, pomegranates, strawberries, tangerines  Fats - Replace butter and margarine with healthy oils, such as olive oil, canola oil, and tahini  - Limit nuts to no more than a handful a day  - Nuts include walnuts, almonds, pecans, pistachios, pine nuts  - Limit or  avoid candied, honey roasted or heavily salted nuts - Olives are central to the Mediterranean diet - can be eaten whole or used in a variety of dishes   Meats Protein - Limiting red meat: no more than a few times a month - When eating red meat: choose lean cuts and keep the portion to the size of deck of cards - Eggs: approx. 0 to 4 times a week  - Fish and lean poultry: at least 2 a week  - Healthy protein sources include, chicken, [place], lean beef, lamb - Increase intake of seafood such as tuna, salmon, trout, mackerel, shrimp, scallops - Avoid or limit high fat processed meats such as sausage and bacon  Dairy - Include moderate amounts of low fat dairy products  - Focus on healthy dairy such as fat free yogurt, skim milk, low or reduced fat cheese - Limit dairy products higher in fat such as whole or 2% milk, cheese, ice cream  Alcohol - Moderate amounts of red wine is ok  - No more than 5 oz daily for women (all ages) and men older than age 39  - No more than 10 oz of wine daily for men younger than 41  Other - Limit sweets and other desserts  - Use herbs and spices instead of salt to flavor foods  - Herbs and spices common to the traditional Mediterranean Diet include: basil, bay leaves, chives, cloves, cumin, fennel, garlic, lavender, marjoram, mint, oregano, parsley, pepper, rosemary, sage, savory, sumac, tarragon, thyme   It's not just a diet, it's a lifestyle:  . The Mediterranean diet includes lifestyle factors typical of those in the region  . Foods, drinks and meals are best eaten with others and savored . Daily physical activity is important for overall good health . This could be strenuous exercise like running and aerobics . This could also be more leisurely activities such as walking, housework, yard-work, or taking the stairs . Moderation is the key; a balanced and healthy diet accommodates most foods and drinks . Consider portion sizes and frequency of consumption of  certain foods   Meal Ideas & Options:  . Breakfast:  o Whole wheat toast or whole wheat English muffins with peanut butter & hard boiled egg o Steel cut oats topped with apples & cinnamon and skim milk  o Fresh fruit: banana, strawberries, melon, berries, peaches  o Smoothies: strawberries, bananas, greek yogurt, peanut butter o Low fat greek yogurt with blueberries and granola  o Egg white omelet with spinach and mushrooms o Breakfast couscous: whole wheat couscous, apricots, skim milk, cranberries  . Sandwiches:  o Hummus and grilled vegetables (peppers, zucchini, squash) on whole wheat bread   o Grilled chicken on whole wheat pita with lettuce, tomatoes, cucumbers or tzatziki  o Tuna salad on whole wheat bread: tuna salad made with greek yogurt, olives, red peppers, capers, green onions o Garlic rosemary lamb pita: lamb sauted with garlic, rosemary, salt & pepper; add lettuce, cucumber, greek yogurt to pita - flavor with lemon juice and black pepper  . Seafood:  o Mediterranean grilled salmon, seasoned with garlic, basil, parsley, lemon juice and black  pepper o Shrimp, lemon, and spinach whole-grain pasta salad made with low fat greek yogurt  o Seared scallops with lemon orzo  o Seared tuna steaks seasoned salt, pepper, coriander topped with tomato mixture of olives, tomatoes, olive oil, minced garlic, parsley, green onions and cappers  . Meats:  o Herbed greek chicken salad with kalamata olives, cucumber, feta  o Red bell peppers stuffed with spinach, bulgur, lean ground beef (or lentils) & topped with feta   o Kebabs: skewers of chicken, tomatoes, onions, zucchini, squash  o [place] burgers: made with red onions, mint, dill, lemon juice, feta cheese topped with roasted red peppers . Vegetarian o Cucumber salad: cucumbers, artichoke hearts, celery, red onion, feta cheese, tossed in olive oil & lemon juice  o Hummus and whole grain pita points with a greek salad (lettuce, tomato, feta,  olives, cucumbers, red onion) o Lentil soup with celery, carrots made with vegetable broth, garlic, salt and pepper  o Tabouli salad: parsley, bulgur, mint, scallions, cucumbers, tomato, radishes, lemon juice, olive oil, salt and pepper.

## 2014-12-25 NOTE — Telephone Encounter (Signed)
See notes under 12/23/14 OV. Called pt and he said that ins has stopped covering the viagra and he will call us back after stress test, if OK, and have [place] send in Rx for preferred Cialis. Also ins will not cover invokana. He will call his ins and ask what meds they cover as alternative and call me back.

## 2015-01-26 ENCOUNTER — Ambulatory Visit: Payer: Managed Care, Other (non HMO) | Admitting: Internal Medicine

## 2015-01-26 ENCOUNTER — Ambulatory Visit (INDEPENDENT_AMBULATORY_CARE_PROVIDER_SITE_OTHER): Payer: Managed Care, Other (non HMO) | Admitting: Cardiology

## 2015-01-26 ENCOUNTER — Encounter: Payer: Self-pay | Admitting: Cardiology

## 2015-01-26 VITALS — BP 130/100 | HR 97 | Ht 70.0 in | Wt 239.9 lb

## 2015-01-26 DIAGNOSIS — I10 Essential (primary) hypertension: Secondary | ICD-10-CM

## 2015-01-26 NOTE — Progress Notes (Signed)
Cardiology Office Note   Date:  01/26/2015   ID:  Alexander [name], DOB 11/21/1966, MRN [id number]  PCP:  Jenny Reichmann, MD  Cardiologist:   Minus Breeding, MD   No chief complaint on file.     History of Present Illness: Alexander [name] is a 49 y.o. male who presents for evaluation of multiple cardiovascular risk factors including hypertension, severe diabetes and hyperlipidemia. He has had no past cardiac history himself. However, he also has a family history. His diabetes isn't very difficult to control and his hemoglobin A1c is greater than 11. He denies any chest pressure, neck or arm discomfort. He doesn't have any palpitations, presyncope or syncope. He's tried to late but he said no success release no gain. He is active walking 30 minutes 3 times a week. 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  Past Medical History  Diagnosis Date  . Diabetes mellitus without complication   . Hypertension   . Hyperlipidemia     Past Surgical History  Procedure Laterality Date  . Orif hip fracture Right      Current Outpatient Prescriptions  Medication Sig Dispense Refill  . aspirin 81 MG tablet Take 81 mg by mouth every other day.    . canagliflozin (INVOKANA) 100 MG TABS tablet Take 1 tablet (100 mg total) by mouth daily. 30 tablet 0  . glipiZIDE (GLUCOTROL) 10 MG tablet Take 1 tablet (10 mg total) by mouth 2 (two) times daily before a meal. PATIENT NEEDS DIABETES CHECKUP FOR ADDITIONAL REFILLS 60 tablet 1  . losartan (COZAAR) 100 MG tablet TAKE 1 TABLET (100 MG TOTAL) BY MOUTH DAILY. 30 tablet 2  . Multiple Vitamin (MULTIVITAMIN) tablet Take 1 tablet by mouth daily.    . mupirocin ointment (BACTROBAN) 2 % Place 1 application into the nose 2 (two) times daily. 22 g 0  . simvastatin (ZOCOR) 40 MG tablet TAKE 1 TABLET (40 MG TOTAL) BY MOUTH DAILY. 30 tablet 2  . sitaGLIPtin-metformin (JANUMET) 50-1000 MG per tablet Take 1 tablet by  mouth 2 (two) times daily with a meal. 60 tablet 1  . VIAGRA 100 MG tablet Take 100 mg by mouth.   1   No current facility-administered medications for this visit.    Allergies:   Review of patient's allergies indicates no known allergies.    Social History:  The patient  reports that he has never smoked. He has never used smokeless tobacco. He reports that he does not drink alcohol or use illicit drugs.   Family History:  The patient's family history includes Diabetes in his father; Heart disease in his father.    ROS:  Please see the history of present illness.   Otherwise, review of systems are positive for none.   All other systems are reviewed and negative.    PHYSICAL EXAM: VS:  BP 130/100 mmHg  Pulse 97  Ht 5\' 10"  (1.778 m)  Wt 239 lb 14.4 oz (108.818 kg)  BMI 34.42 kg/m2 , BMI Body mass index is 34.42 kg/(m^2). GEN: Well nourished, well developed, in no acute distress HEENT: normal Neck: no JVD, carotid bruits, or masses Cardiac: RRR; no murmurs, rubs, or gallops,no edema  Respiratory:  clear to auscultation bilaterally, normal work of breathing GI: soft, nontender, nondistended, + BS MS: no deformity or atrophy Skin: warm and dry, no rash Neuro:  Strength and sensation are intact Psych: euthymic mood, full affect  EKG:  EKG is ordered today. The ekg ordered today demonstrates sinus rhythm, rate 97, axis within normal limits, intervals within normal limits, poor anterior R wave progression, no acute ST-T wave changes.   01/26/2015    Recent Labs: 08/21/2014: ALT 21; BUN 13; Creatinine 0.84; Potassium 4.5; Sodium 134*    Lipid Panel    Component Value Date/Time   CHOL 161 08/21/2014 1105   TRIG 145 08/21/2014 1105   HDL 58 08/21/2014 1105   CHOLHDL 2.8 08/21/2014 1105   VLDL 29 08/21/2014 1105   LDLCALC 74 08/21/2014 1105      Wt Readings from Last 3 Encounters:  01/26/15 239 lb 14.4 oz (108.818 kg)  12/23/14 239 lb (108.41 kg)  08/21/14 236 lb (107.049  kg)      Other studies Reviewed: Additional studies/ records that were reviewed today include: Office records and labs from Dr. Everlene Farrier. Review of the above records demonstrates: As recorded elsewhere   ASSESSMENT AND PLAN:  CARDIOVASCULAR RISK FACTORS:  The patient has multiple cardiovascular risk factors. However, he has no active symptoms. I will screen him with stress testing.  I will bring the patient back for a POET (Plain Old Exercise Test). This will allow me to screen for obstructive coronary disease, risk stratify and very importantly provide a prescription for exercise.  HTN:  His blood sugar is not quite at target. However, I think with increasing his activity 5 days per week and a slight bit of weight loss he will reach target without mention change.  DM:  This is being followed closely by Dr. Everlene Farrier.  Current medicines are reviewed at length with the patient today.  The patient does not have concerns regarding medicines.  The following changes have been made:  no change  Labs/ tests ordered today include: POET (Plain Old Exercise Treadmill)   No orders of the defined types were placed in this encounter.     Disposition:   FU with me as needed   Signed, Minus Breeding, MD  01/26/2015 12:24 PM    [facility]

## 2015-01-26 NOTE — Patient Instructions (Signed)
Your physician recommends that you schedule a follow-up appointment in: one year with Dr. Hochrein  We are ordering a stress test for you to get done  

## 2015-01-30 ENCOUNTER — Telehealth: Payer: Self-pay

## 2015-01-30 DIAGNOSIS — E118 Type 2 diabetes mellitus with unspecified complications: Secondary | ICD-10-CM

## 2015-01-30 NOTE — Telephone Encounter (Signed)
[name], pharm sent message that pt's ins doesn't cover Viagra. They are requesting a change to Cialis.

## 2015-01-31 NOTE — Telephone Encounter (Signed)
Attempted to call pt. Left VM to call back.  Pt did see cardiologist yesterday and is set up for a stress test on 3/4 at 3:30pm

## 2015-01-31 NOTE — Telephone Encounter (Signed)
I did not send him a prescription for Viagra as he is overdue for a stress test due to some of his health issues. I am uncomfortable giving him any medication for erectile dysfunction until that test is complete.

## 2015-02-06 ENCOUNTER — Encounter: Payer: Self-pay | Admitting: Neurology

## 2015-02-06 ENCOUNTER — Ambulatory Visit (INDEPENDENT_AMBULATORY_CARE_PROVIDER_SITE_OTHER): Payer: Managed Care, Other (non HMO) | Admitting: Neurology

## 2015-02-06 VITALS — BP 130/80 | HR 84 | Temp 97.8°F | Resp 18 | Ht 70.0 in | Wt 243.5 lb

## 2015-02-06 DIAGNOSIS — R930 Abnormal findings on diagnostic imaging of skull and head, not elsewhere classified: Secondary | ICD-10-CM

## 2015-02-06 DIAGNOSIS — H5310 Unspecified subjective visual disturbances: Secondary | ICD-10-CM

## 2015-02-06 DIAGNOSIS — R9089 Other abnormal findings on diagnostic imaging of central nervous system: Secondary | ICD-10-CM

## 2015-02-06 LAB — BUN: BUN: 15 mg/dL (ref 6–23)

## 2015-02-06 LAB — CREATININE, SERUM: Creat: 0.75 mg/dL (ref 0.50–1.35)
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
## 2015-02-06 NOTE — Patient Instructions (Addendum)
Based on the MRI, the findings are nonspecific and I cannot say for sure what the cause could be.  It could be related to your other medical conditions (high blood pressure, diabetes) or you prior history of head trauma.  However, another condition such as MS cannot be ruled out. 1.  We will get another MRI of the brain as well as MRI of cervical spine to look for any changes since last MRI [facility]  03/05/15 at 8:45am  2.  We may very well then order a spinal tap to look for evidence of inflammation that may suggest MS 3.  I need to review records from your eye doctors 4.  Follow up in 3 months.

## 2015-02-20 ENCOUNTER — Other Ambulatory Visit: Payer: Self-pay | Admitting: Family Medicine

## 2015-02-20 ENCOUNTER — Other Ambulatory Visit: Payer: Self-pay | Admitting: Physician Assistant

## 2015-02-21 ENCOUNTER — Telehealth (HOSPITAL_COMMUNITY): Payer: Self-pay

## 2015-02-21 NOTE — Telephone Encounter (Signed)
Encounter complete. 

## 2015-02-22 ENCOUNTER — Telehealth (HOSPITAL_COMMUNITY): Payer: Self-pay

## 2015-02-22 NOTE — Telephone Encounter (Signed)
Encounter complete. 

## 2015-02-23 ENCOUNTER — Ambulatory Visit (HOSPITAL_COMMUNITY)
Admission: RE | Admit: 2015-02-23 | Discharge: 2015-02-23 | Disposition: A | Discharge status: Home / Self Care | Payer: Managed Care, Other (non HMO) | Source: Ambulatory Visit | Attending: Internal Medicine | Admitting: Internal Medicine

## 2015-02-23 DIAGNOSIS — I10 Essential (primary) hypertension: Secondary | ICD-10-CM | POA: Insufficient documentation

## 2015-02-23 NOTE — Telephone Encounter (Signed)
Instead of combo pill, split it up into separate tablets of Sitagliptin 50 mg and Metformin 1000 mg daily.

## 2015-02-23 NOTE — Telephone Encounter (Signed)
The [employer] will usually cover Onglyza, Trygenta, or Linagliptin. Did you want to try one of these medications to see if insurance will cover. Januvia will require a PA and will not cover this until one of these medications has tried and failed.

## 2015-02-23 NOTE — Procedures (Signed)
Exercise Treadmill Test    Test  Exercise Tolerance Test Ordering MD: Marijo File, MD  Interpreting MD:   Unique Test No:1 Treadmill:  1  Indication for ETT: Fam Hx of CAD, HTN,HLD  Contraindication to ETT: No   Stress Modality: exercise - treadmill  Cardiac Imaging Performed: non   Protocol: standard Bruce - maximal  Max BP:  165/99  Max MPHR (bpm):  171 85% MPR (bpm):  145  MPHR obtained (bpm):  162 % MPHR obtained:  94  Reached 85% MPHR (min:sec):  8:05 Total Exercise Time (min-sec):  10:15  Workload in METS:  12.00 Borg Scale:   Reason ETT Terminated:  fatigue    ST Segment Analysis At Rest: normal ST segments - no evidence of significant ST depression With Exercise: no evidence of significant ST depression  Other Information Arrhythmia:  No Angina during ETT:  absent (0) Quality of ETT:  diagnostic  ETT Interpretation:  normal - no evidence of ischemia by ST analysis  Comments: The patient had an good exercise tolerance.  There was no chest pain.  There was an appropriate level of dyspnea.  There were no arrhythmias, a normal heart rate response and normal BP response.  There were no ischemic ST T wave changes and a normal heart rate recovery.   Recommendations: No further testing is needed.

## 2015-02-23 NOTE — Telephone Encounter (Signed)
Patient states his RX is $420 for sitaGLIPtin-metformin (JANUMET) 50-1000 MG per tablet He cannot afford this.   Please call  [phone]   [facility] [place]

## 2015-02-26 ENCOUNTER — Encounter: Payer: Managed Care, Other (non HMO) | Attending: Emergency Medicine | Admitting: *Deleted

## 2015-02-26 DIAGNOSIS — Z713 Dietary counseling and surveillance: Secondary | ICD-10-CM | POA: Insufficient documentation

## 2015-02-26 DIAGNOSIS — E119 Type 2 diabetes mellitus without complications: Secondary | ICD-10-CM | POA: Insufficient documentation

## 2015-02-26 NOTE — Telephone Encounter (Signed)
Are these Rx's ok, please sign and specify refills.

## 2015-02-26 NOTE — Progress Notes (Signed)
Appointment start time: 0800  Appointment end time: 0830  Assessment: Alexander [name] is here for follow up diabetes self-management education.  Alexander [name] is trying to keep up positive lifestyle changes.  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  Has bought more fruits and vegetables and is eating those with dressing.  Still thinks Alexander [name] needs to work on his portions, but overall, his diet is fairly well controlled.  Alexander [name] does not monitor his glucose that often: 2 times/week.  Those readings are high.  This morning is was 253 mg/dl.  His lab work from January revealed glucose of 318 mg/dl.  Despite food and exercise modifications, his glucose levels remain out of control.   Medication: glipizide and Janumet; wants to change Janumet because it's way too expensive   Dietary assessment- 24 hour recall B: 2 eggs, chicken breast L: green beans, and buffalo bites from [employer] D: grilled lamb, sauted spinach, mashed sweet potato, 1/2 salad Beverages: water, cranberry juice, 1 alcoholic beverage (crown on rocks)  Saturday B: broccoli, sweet peppers with ranch dressing L: 1/2 sandwich (chicken salad) and soup  D: salmon, salad, broccoli Beverages: water,1 alcoholic beverage (pina colada)  During the week: B: egg, grits,turkey bacon or cereal with OJ;  Sometimes just apple L: salad with chicken or  Bacon bits D: chicken or fish with vegetable  Physical activity: 3-4 day/week for 1.5 hours- treadmill or bike for 60 minutes and then weights for 30 minutes   Sleep: 4/5-8 hours.  Business is slow right now and that's a little stressful  Nutrition diagnosis: Impaired nutrient utilization as related to carbohydrates.  As evidenced by uncontrolled diabetes.    Intervention:  Suggested monitoring his glucose more often, 1 time/day so we have more data.  Referred back to provider for medication adjustment.  His reported diet is fine.  His exercise habits are ok.  Explained to Alexander [name] that diabetes is a progressive disease and his body is  probably changing and that Alexander [name] might need different medication.  Alexander [name] should talk with his provider about his concerns.  Get adequate sleep and practice stress management.  Monitoring: prn

## 2015-02-26 NOTE — Patient Instructions (Signed)
Call doctor to talk about changing medications check glucose every day.  Keep log and show doctor what's going on. Consider endocrinologist (diabetes specialist) Try to get 6-8 hours sleep Try to take time for relaxation Food is awesome, keep it up! Try to exercise as much as you can

## 2015-02-27 MED ORDER — SITAGLIPTIN PHOSPHATE 50 MG PO TABS: 50.0000 mg | ORAL_TABLET | Freq: Every day | ORAL | Status: DC

## 2015-02-27 MED ORDER — METFORMIN HCL 1000 MG PO TABS: 1000.0000 mg | ORAL_TABLET | Freq: Every day | ORAL | Status: DC

## 2015-02-27 NOTE — Telephone Encounter (Signed)
Medications ready.

## 2015-02-27 NOTE — Telephone Encounter (Signed)
Spoke with pt, advised rx's were sent in and to let me know if they are an acceptable price range.

## 2015-03-04 ENCOUNTER — Telehealth: Payer: Self-pay | Admitting: Emergency Medicine

## 2015-03-04 NOTE — Telephone Encounter (Signed)
Patient is waiting on his Cialis to be called to his pharmacy.  [phone]

## 2015-03-05 ENCOUNTER — Ambulatory Visit (HOSPITAL_COMMUNITY): Payer: Managed Care, Other (non HMO)

## 2015-03-05 MED ORDER — TADALAFIL 20 MG PO TABS
10.0000 mg | ORAL_TABLET | ORAL | Status: DC | PRN
Start: 2015-03-05 — End: 2015-12-11

## 2015-03-05 NOTE — Telephone Encounter (Signed)
RX sent

## 2015-03-05 NOTE — Telephone Encounter (Signed)
Tishira, I saw your prev notes on Rxing Cialis on 01/30/15 phone message. I am copying cardiologist's comment on stress test below, but you can view complete results under the ECG tab in chart review. Please send in Rx if appropriate.  Notes Recorded by Pixie Casino, MD on 03/01/2015 at 10:14 AM Please notify patient that the exercise treadmill stress test results were normal.

## 2015-03-06 NOTE — Telephone Encounter (Signed)
Gave pt message.

## 2015-03-16 ENCOUNTER — Ambulatory Visit (HOSPITAL_COMMUNITY)
Admission: RE | Admit: 2015-03-16 | Discharge: 2015-03-16 | Disposition: A | Discharge status: Home / Self Care | Payer: Managed Care, Other (non HMO) | Source: Ambulatory Visit | Attending: Neurology | Admitting: Neurology

## 2015-03-16 DIAGNOSIS — R9089 Other abnormal findings on diagnostic imaging of central nervous system: Secondary | ICD-10-CM

## 2015-03-16 DIAGNOSIS — M4802 Spinal stenosis, cervical region: Secondary | ICD-10-CM | POA: Diagnosis not present

## 2015-03-16 DIAGNOSIS — H5310 Unspecified subjective visual disturbances: Secondary | ICD-10-CM

## 2015-03-16 MED ORDER — GADOBENATE DIMEGLUMINE 529 MG/ML IV SOLN
20.0000 mL | Freq: Once | INTRAVENOUS | Status: AC | PRN
  Administered 2015-03-16: 20 mL via INTRAVENOUS

## 2015-03-19 ENCOUNTER — Telehealth: Payer: Self-pay | Admitting: *Deleted

## 2015-03-19 NOTE — Telephone Encounter (Signed)
Patient is aware of lab level normal

## 2015-03-19 NOTE — Telephone Encounter (Signed)
-----   Message from Pieter Partridge, DO sent at 03/19/2015  6:46 AM EDT ----- MRI of brain is stable compared to prior scan.  No abnormal spinal cord lesions.  I would monitor and repeat MRI of brain with and without contrast in one year. ----- Message -----    From: Rad Results In Interface    Sent: 03/16/2015   8:10 PM      To: Pieter Partridge, DO

## 2015-03-19 NOTE — Telephone Encounter (Signed)
MRI of brain is stable compared to prior scan. No abnormal spinal cord lesions. I would monitor and repeat MRI of brain with and without contrast in one year.  Patient is aware

## 2015-03-21 ENCOUNTER — Other Ambulatory Visit: Payer: Self-pay

## 2015-03-21 MED ORDER — GLIPIZIDE 10 MG PO TABS
10.0000 mg | ORAL_TABLET | Freq: Two times a day (BID) | ORAL | Status: DC
Start: 2015-03-21 — End: 2015-04-25

## 2015-04-25 ENCOUNTER — Other Ambulatory Visit: Payer: Self-pay | Admitting: Family Medicine

## 2015-05-12 ENCOUNTER — Other Ambulatory Visit: Payer: Self-pay | Admitting: Physician Assistant

## 2015-05-14 ENCOUNTER — Ambulatory Visit: Payer: Managed Care, Other (non HMO) | Admitting: Neurology

## 2015-05-14 DIAGNOSIS — Z029 Encounter for administrative examinations, unspecified: Secondary | ICD-10-CM

## 2015-05-15 ENCOUNTER — Encounter: Payer: Self-pay | Admitting: Neurology

## 2015-06-24 ENCOUNTER — Other Ambulatory Visit: Payer: Self-pay | Admitting: Physician Assistant

## 2015-06-24 ENCOUNTER — Other Ambulatory Visit: Payer: Self-pay | Admitting: Emergency Medicine

## 2015-06-24 DIAGNOSIS — E118 Type 2 diabetes mellitus with unspecified complications: Secondary | ICD-10-CM

## 2015-06-24 DIAGNOSIS — E119 Type 2 diabetes mellitus without complications: Secondary | ICD-10-CM

## 2015-06-26 MED ORDER — CANAGLIFLOZIN 100 MG PO TABS: 100.0000 mg | ORAL_TABLET | Freq: Every day | ORAL | Status: DC

## 2015-06-26 MED ORDER — METFORMIN HCL 1000 MG PO TABS: 1000.0000 mg | ORAL_TABLET | Freq: Every day | ORAL | Status: DC

## 2015-06-26 NOTE — Telephone Encounter (Signed)
Spoke to pt about need for f/up and he agreed to come in w/in the week. I advised I will send in all RFs for 1 week. Pt clarified that ins would not cover the Janumet and he had to remain on the metformin and Januvia separately. He does not need Januvia, just Invokana, Metformin and glipizide.

## 2015-07-01 ENCOUNTER — Other Ambulatory Visit: Payer: Self-pay | Admitting: Physician Assistant

## 2015-07-14 ENCOUNTER — Other Ambulatory Visit: Payer: Self-pay | Admitting: Physician Assistant

## 2015-07-16 NOTE — Telephone Encounter (Signed)
[name], you last saw pt on 12/23/14 for DM check up and wanted pt to RTC in 6 weeks. We gave him warnings on last two RFs that he needed to RTC and then I spoke to him on 06/24/15 (Refill encounter) and he agreed to RTC in 7 days and I RFd it until then. He has not been in or made an appt. Do you want to deny RFs?

## 2015-07-22 ENCOUNTER — Ambulatory Visit (INDEPENDENT_AMBULATORY_CARE_PROVIDER_SITE_OTHER): Payer: Managed Care, Other (non HMO) | Admitting: Family Medicine

## 2015-07-22 VITALS — BP 130/78 | HR 92 | Temp 97.5°F | Resp 18 | Ht 69.0 in | Wt 220.0 lb

## 2015-07-22 DIAGNOSIS — E1165 Type 2 diabetes mellitus with hyperglycemia: Secondary | ICD-10-CM | POA: Diagnosis not present

## 2015-07-22 DIAGNOSIS — N528 Other male erectile dysfunction: Secondary | ICD-10-CM

## 2015-07-22 DIAGNOSIS — E119 Type 2 diabetes mellitus without complications: Secondary | ICD-10-CM | POA: Diagnosis not present

## 2015-07-22 DIAGNOSIS — IMO0002 Reserved for concepts with insufficient information to code with codable children: Secondary | ICD-10-CM

## 2015-07-22 DIAGNOSIS — Z79899 Other long term (current) drug therapy: Secondary | ICD-10-CM

## 2015-07-22 DIAGNOSIS — E118 Type 2 diabetes mellitus with unspecified complications: Secondary | ICD-10-CM | POA: Diagnosis not present

## 2015-07-22 DIAGNOSIS — N529 Male erectile dysfunction, unspecified: Secondary | ICD-10-CM

## 2015-07-22 DIAGNOSIS — R9089 Other abnormal findings on diagnostic imaging of central nervous system: Secondary | ICD-10-CM

## 2015-07-22 DIAGNOSIS — R930 Abnormal findings on diagnostic imaging of skull and head, not elsewhere classified: Secondary | ICD-10-CM | POA: Diagnosis not present

## 2015-07-22 DIAGNOSIS — I10 Essential (primary) hypertension: Secondary | ICD-10-CM | POA: Diagnosis not present

## 2015-07-22 DIAGNOSIS — H5310 Unspecified subjective visual disturbances: Secondary | ICD-10-CM

## 2015-07-22 LAB — MICROALBUMIN / CREATININE URINE RATIO
Creatinine, Urine: 46.8 mg/dL
Microalb Creat Ratio: 32.1 mg/g — ABNORMAL HIGH (ref 0.0–30.0)
Microalb, Ur: 1.5 mg/dL (ref <2.0)

## 2015-07-22 LAB — GLUCOSE, POCT (MANUAL RESULT ENTRY)

## 2015-07-22 LAB — POCT GLYCOSYLATED HEMOGLOBIN (HGB A1C): Hemoglobin A1C: 13.6

## 2015-07-22 MED ORDER — SIMVASTATIN 40 MG PO TABS: ORAL_TABLET | ORAL | Status: DC

## 2015-07-22 MED ORDER — METFORMIN HCL 1000 MG PO TABS: 1000.0000 mg | ORAL_TABLET | Freq: Two times a day (BID) | ORAL | Status: DC

## 2015-07-22 MED ORDER — GLIPIZIDE 10 MG PO TABS: 20.0000 mg | ORAL_TABLET | Freq: Two times a day (BID) | ORAL | Status: DC

## 2015-07-22 MED ORDER — PIOGLITAZONE HCL 15 MG PO TABS: 15.0000 mg | ORAL_TABLET | Freq: Every day | ORAL | Status: DC

## 2015-07-22 NOTE — Progress Notes (Addendum)
Subjective:    Patient ID: Alexander [name], male    DOB: 1966-01-31, 49 y.o.   MRN: [id number] This chart was scribed for Delman Cheadle, MD by Marti Sleigh, Medical Scribe. This patient was seen in Room 2 and the patient's care was started a 3:48 PM.  Chief Complaint  Patient presents with  . Medication Refill    metformin, mupirocin ointment, viarga, and glipizide    HPI HPI Comments: Alexander [name] is a 49 y.o. male who presents to [facility] reporting for a medication refill. Pt's last A1C was 10 one year ago, and 11 one year before that. Pt is not on invocana due to its high cost. Pt is on janumet and glipizide. 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 Pt states he has lost 10-15lbs by controlling his food portions. Pt checks his blood sugar once per day, which has been around 200, both fasting and after eating. Pt has been to a dietician for his DM, which was helpful. Pt has been to endocrinology many years ago but does not remember where he was seen. Pt states he is due for an eye exam, and usually goes [facility]. Pt denies dysuria, urinary frequency or hesitancy, endorses nocturia multiple events per night.   Past Medical History  Diagnosis Date  . Diabetes mellitus without complication   . Hypertension   . Hyperlipidemia    No Known Allergies Current Outpatient Prescriptions on File Prior to Visit  Medication Sig Dispense Refill  . aspirin 81 MG tablet Take 81 mg by mouth every other day.    Marland Kitchen glipiZIDE (GLUCOTROL) 10 MG tablet Take 1 tablet (10 mg total) by mouth 2 (two) times daily before a meal. NO MORE REFILLS WITHOUT OFFICE VISIT - 3RD NOTICE 14 tablet 0  . losartan (COZAAR) 100 MG tablet TAKE 1 TABLET (100 MG TOTAL) BY MOUTH DAILY. 30 tablet 3  . metFORMIN (GLUCOPHAGE) 1000 MG tablet Take 1 tablet (1,000 mg total) by mouth daily with breakfast. NO MORE REFILLS WITHOUT OFFICE VISIT - 3RD NOTICE 7 tablet 0  . Multiple Vitamin (MULTIVITAMIN) tablet Take 1 tablet by mouth  daily.    . mupirocin ointment (BACTROBAN) 2 % Place 1 application into the nose 2 (two) times daily. 22 g 0  . simvastatin (ZOCOR) 40 MG tablet TAKE 1 TABLET (40 MG TOTAL) BY MOUTH DAILY. 30 tablet 2  . simvastatin (ZOCOR) 40 MG tablet TAKE 1 TABLET (40 MG TOTAL) BY MOUTH DAILY. 30 tablet 3  . sitaGLIPtin (JANUVIA) 50 MG tablet Take 1 tablet (50 mg total) by mouth daily. 30 tablet 2  . tadalafil (CIALIS) 20 MG tablet Take 0.5-1 tablets (10-20 mg total) by mouth every other day as needed for erectile dysfunction. 5 tablet 3  . VIAGRA 100 MG tablet Take 100 mg by mouth.   1  . canagliflozin (INVOKANA) 100 MG TABS tablet Take 1 tablet (100 mg total) by mouth daily. NO MORE REFILLS WITHOUT OFFICE VISIT - 3RD NOTICE (Patient not taking: Reported on 07/22/2015) 7 tablet 0  . sitaGLIPtin-metformin (JANUMET) 50-1000 MG per tablet Take 1 tablet by mouth 2 (two) times daily with a meal. PATIENT NEEDS OFFICE VISIT FOR ADDITIONAL REFILLS (Patient not taking: Reported on 07/22/2015) 60 tablet 0   No current facility-administered medications on file prior to visit.   Depression screen [facility] 2/9 07/22/2015 08/29/2014  Decreased Interest 0 0  Down, Depressed, Hopeless 0 0  PHQ - 2 Score 0 0  Review of Systems  Constitutional: Positive for unexpected weight change. Negative for fever, chills, activity change and appetite change.  Eyes: Negative for photophobia, pain and visual disturbance.  Respiratory: Negative for shortness of breath.   Cardiovascular: Negative for chest pain, palpitations and leg swelling.  Endocrine: Positive for polydipsia and polyuria. Negative for polyphagia.  Genitourinary: Positive for frequency. Negative for dysuria and urgency.  Neurological: Negative for dizziness, syncope, facial asymmetry, weakness, light-headedness and headaches.  Psychiatric/Behavioral: Negative for sleep disturbance and dysphoric mood.       Objective:  BP 130/78 mmHg  Pulse 92  Temp(Src) 97.5 F (36.4  C) (Oral)  Resp 18  Ht 5\' 9"  (1.753 m)  Wt 220 lb (99.791 kg)  BMI 32.47 kg/m2  SpO2 98%  Physical Exam  Constitutional: He is oriented to person, place, and time. He appears well-developed and well-nourished. No distress.  HENT:  Head: Normocephalic and atraumatic.  Eyes: Pupils are equal, round, and reactive to light.  Neck: Neck supple.  Cardiovascular: Normal rate.   Pulmonary/Chest: Effort normal. No respiratory distress.  Musculoskeletal: Normal range of motion.  Neurological: He is alert and oriented to person, place, and time. Coordination normal.  Skin: Skin is warm and dry. He is not diaphoretic.  Psychiatric: He has a normal mood and affect. His behavior is normal.  Nursing note and vitals reviewed.     Results for orders placed or performed in visit on 07/22/15  POCT glycosylated hemoglobin (Hb A1C)  Result Value Ref Range   Hemoglobin A1C 13.6   POCT glucose (manual entry)  Result Value Ref Range   POC Glucose =>444 70 - 99 mg/dl    Assessment & Plan:   1. Type 2 diabetes mellitus with complication - pt with prolonged uncontrolled DM and non-compliant. 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  Been off metformin for a week and last rx was only for qd rather than prev bid sig so restart 1000 bid. Off glipizide for almost a mo - restart and incr from 10 bid to 20 bid. Start actos 15. Pt cannot afford Januvia or Invokana when they were rx'ed prior but found a coupon for the latter with no copay for the yr so start invokana as well. Pt will likely need a PA for invokana which should not be a problem considering the range of meds he has failed but may need to switch to other SGL2 per insurance formulary - if so will get pt coupon for jardiance 10 qd.  I am concerned pt may be developing a mixed type of DM since loosing weight with increasing a1c over last sev mos so refer to  endocrine for further eval. Pt saw Dr. Loanne Drilling 2 yrs ago but would like to be referred to other endocrinologist for DM management - just personality mismatch - so try Dr. Renne Crigler. Pt saw nutrition several mos ago and her notes report pt doing an excellent job on DM diet and just needs to decrease portion.  2. Essential hypertension - cont lisinopril  3. Impotence of organic origin - ok to refill phosphodiesterase inh prn, did have nml ETT sev mos prior  4. Polypharmacy      6. Type 2 diabetes mellitus, uncontrolled   7. Subjective visual disturbance of left eye - had nml orbit MRI but prior DM eye exams have been done in the  big-box eye stores - as there was a concern that this may be due to DM I did request pt to have his next DM eye exam with optho - referral placed - due to Dr. Georgie Chard concern about ischemia or even poss (but unlikely) demyelinating disease vs DM as etiology  8. Abnormal finding on MRI of brain - pt was seen by Dr. Jannifer Franklin and second opinion by Dr. Tomi Likens - there was a concern that a demylinating disease could not be excluded but prior trauma or ischemic inj more likely - has had 2 MRI of brain and 1 of c-spine with plan to repeat these in 1 yr and cons LP if any sxs.  9.     HPL - simvastatin refilled - pt has not been fasting at OV in a while - last lipid 07/2014 at goal - needs to be fasting at next OV in 3-4 mos.  Orders Placed This Encounter  Procedures  . Microalbumin/Creatinine Ratio, Urine  . Comprehensive metabolic panel  . Ambulatory referral to Endocrinology    Referral Priority:  Routine    Referral Type:  Consultation    Referral Reason:  Specialty Services Required    Number of Visits Requested:  1  . Ambulatory referral to Ophthalmology    Referral Priority:  Routine    Referral Type:  Consultation    Referral Reason:  Specialty Services Required    Requested Specialty:  Ophthalmology    Number of Visits Requested:  1  . POCT glycosylated hemoglobin (Hb A1C)    . POCT glucose (manual entry)    Meds ordered this encounter  Medications  . metFORMIN (GLUCOPHAGE) 1000 MG tablet    Sig: Take 1 tablet (1,000 mg total) by mouth 2 (two) times daily with a meal.    Dispense:  60 tablet    Refill:  5  . glipiZIDE (GLUCOTROL) 10 MG tablet    Sig: Take 2 tablets (20 mg total) by mouth 2 (two) times daily before a meal.    Dispense:  120 tablet    Refill:  5  . pioglitazone (ACTOS) 15 MG tablet    Sig: Take 1 tablet (15 mg total) by mouth daily.    Dispense:  30 tablet    Refill:  3  . simvastatin (ZOCOR) 40 MG tablet    Sig: TAKE 1 TABLET (40 MG TOTAL) BY MOUTH DAILY.    Dispense:  30 tablet    Refill:  3   Over 40 min spent in face-to-face evaluation of and consultation with patient and coordination of care.  Over 50% of this time was spent counseling this patient.  I personally performed the services described in this documentation, which was scribed in my presence. The recorded information has been reviewed and considered, and addended by me as needed.  Delman Cheadle, MD MPH

## 2015-07-22 NOTE — Patient Instructions (Signed)
Increase your glipizide to 2 tabs twice a day.   Go see a medical eye doctor (opthamologist - I have referred you) Start actos. Continue metformin. Start one of the new on the market meds with a coupon. See an endocrinologist for your next appointment in 3-4 mos but if you develop any signs of worsening, return here immediately. Hyperglycemia Hyperglycemia occurs when the glucose (sugar) in your blood is too high. Hyperglycemia can happen for many reasons, but it most often happens to people who do not know they have diabetes or are not managing their diabetes properly.  CAUSES  Whether you have diabetes or not, there are other causes of hyperglycemia. Hyperglycemia can occur when you have diabetes, but it can also occur in other situations that you might not be as aware of, such as: Diabetes  If you have diabetes and are having problems controlling your blood glucose, hyperglycemia could occur because of some of the following reasons:  Not following your meal plan.  Not taking your diabetes medications or not taking it properly.  Exercising less or doing less activity than you normally do.  Being sick. Pre-diabetes  This cannot be ignored. Before people develop Type 2 diabetes, they almost always have "pre-diabetes." This is when your blood glucose levels are higher than normal, but not yet high enough to be diagnosed as diabetes. Research has shown that some long-term damage to the body, especially the heart and circulatory system, may already be occurring during pre-diabetes. If you take action to manage your blood glucose when you have pre-diabetes, you may delay or prevent Type 2 diabetes from developing. Stress  If you have diabetes, you may be "diet" controlled or on oral medications or insulin to control your diabetes. However, you may find that your blood glucose is higher than usual in the hospital whether you have diabetes or not. This is often referred to as "stress  hyperglycemia." Stress can elevate your blood glucose. This happens because of hormones put out by the body during times of stress. If stress has been the cause of your high blood glucose, it can be followed regularly by your caregiver. That way he/she can make sure your hyperglycemia does not continue to get worse or progress to diabetes. Steroids  Steroids are medications that act on the infection fighting system (immune system) to block inflammation or infection. One side effect can be a rise in blood glucose. Most people can produce enough extra insulin to allow for this rise, but for those who cannot, steroids make blood glucose levels go even higher. It is not unusual for steroid treatments to "uncover" diabetes that is developing. It is not always possible to determine if the hyperglycemia will go away after the steroids are stopped. A special blood test called an A1c is sometimes done to determine if your blood glucose was elevated before the steroids were started. SYMPTOMS  Thirsty.  Frequent urination.  Dry mouth.  Blurred vision.  Tired or fatigue.  Weakness.  Sleepy.  Tingling in feet or leg. DIAGNOSIS  Diagnosis is made by monitoring blood glucose in one or all of the following ways:  A1c test. This is a chemical found in your blood.  Fingerstick blood glucose monitoring.  Laboratory results. TREATMENT  First, knowing the cause of the hyperglycemia is important before the hyperglycemia can be treated. Treatment may include, but is not be limited to:  Education.  Change or adjustment in medications.  Change or adjustment in meal plan.  Treatment for  an illness, infection, etc.  More frequent blood glucose monitoring.  Change in exercise plan.  Decreasing or stopping steroids.  Lifestyle changes. HOME CARE INSTRUCTIONS   Test your blood glucose as directed.  Exercise regularly. Your caregiver will give you instructions about exercise. Pre-diabetes or  diabetes which comes on with stress is helped by exercising.  Eat wholesome, balanced meals. Eat often and at regular, fixed times. Your caregiver or nutritionist will give you a meal plan to guide your sugar intake.  Being at an ideal weight is important. If needed, losing as little as 10 to 15 pounds may help improve blood glucose levels. SEEK MEDICAL CARE IF:   You have questions about medicine, activity, or diet.  You continue to have symptoms (problems such as increased thirst, urination, or weight gain). SEEK IMMEDIATE MEDICAL CARE IF:   You are vomiting or have diarrhea.  Your breath smells fruity.  You are breathing faster or slower.  You are very sleepy or incoherent.  You have numbness, tingling, or pain in your feet or hands.  You have chest pain.  Your symptoms get worse even though you have been following your caregiver's orders.  If you have any other questions or concerns. Document Released: 06/03/2001 Document Revised: 03/01/2012 Document Reviewed: 04/05/2012 [facility] Patient Information 2015 [place], [place]. This information is not intended to replace advice given to you by your health care provider. Make sure you discuss any questions you have with your health care provider.

## 2015-07-23 ENCOUNTER — Telehealth: Payer: Self-pay

## 2015-07-23 LAB — COMPREHENSIVE METABOLIC PANEL
ALT: 20 U/L (ref 9–46)
AST: 15 U/L (ref 10–35)
Albumin: 4.5 g/dL (ref 3.6–5.1)
Alkaline Phosphatase: 60 U/L (ref 40–115)
BUN: 15 mg/dL (ref 7–25)
CALCIUM: 9.3 mg/dL (ref 8.6–10.3)
CO2: 25 mmol/L (ref 20–31)
Chloride: 96 mmol/L — ABNORMAL LOW (ref 98–110)
Creat: 0.91 mg/dL (ref 0.70–1.33)
Glucose, Bld: 480 mg/dL — ABNORMAL HIGH (ref 65–99)
Potassium: 4.2 mmol/L (ref 3.5–5.3)
Sodium: 132 mmol/L — ABNORMAL LOW (ref 135–146)
Total Bilirubin: 0.9 mg/dL (ref 0.2–1.2)
Total Protein: 7.5 g/dL (ref 6.1–8.1)

## 2015-07-23 NOTE — Telephone Encounter (Signed)
Noted, thanks. Pt's meds restarted yest and referred to endocrine.

## 2015-07-23 NOTE — Telephone Encounter (Signed)
Dr. Brigitte Pulse, [facility] called to let you know that this pt's sugar was 480.

## 2015-07-24 ENCOUNTER — Encounter: Payer: Self-pay | Admitting: Family Medicine

## 2015-09-03 ENCOUNTER — Ambulatory Visit: Payer: Managed Care, Other (non HMO) | Admitting: Internal Medicine

## 2015-09-25 ENCOUNTER — Encounter: Payer: Self-pay | Admitting: Emergency Medicine

## 2015-10-03 ENCOUNTER — Other Ambulatory Visit: Payer: Self-pay | Admitting: Physician Assistant

## 2015-10-19 ENCOUNTER — Ambulatory Visit: Payer: Managed Care, Other (non HMO) | Admitting: Internal Medicine

## 2015-10-22 ENCOUNTER — Telehealth: Payer: Self-pay

## 2015-10-22 NOTE — Telephone Encounter (Signed)
Olivia Mackie from [facility] states they need ov notes faxed on pt. Please fax to [phone] and the phone number is [phone]

## 2015-10-31 ENCOUNTER — Other Ambulatory Visit: Payer: Self-pay | Admitting: Physician Assistant

## 2015-11-05 ENCOUNTER — Other Ambulatory Visit: Payer: Self-pay | Admitting: Physician Assistant

## 2015-11-16 ENCOUNTER — Other Ambulatory Visit: Payer: Self-pay | Admitting: Physician Assistant

## 2015-11-23 ENCOUNTER — Ambulatory Visit: Payer: Managed Care, Other (non HMO) | Admitting: Internal Medicine

## 2015-12-11 ENCOUNTER — Other Ambulatory Visit: Payer: Self-pay

## 2015-12-11 MED ORDER — TADALAFIL 20 MG PO TABS: 10.0000 mg | ORAL_TABLET | ORAL | Status: DC | PRN

## 2015-12-11 NOTE — Telephone Encounter (Signed)
Dr Brigitte Pulse, do you want to RF pt's Cialis?

## 2015-12-18 ENCOUNTER — Telehealth: Payer: Self-pay | Admitting: Family Medicine

## 2015-12-21 ENCOUNTER — Other Ambulatory Visit: Payer: Self-pay | Admitting: Physician Assistant

## 2015-12-21 ENCOUNTER — Other Ambulatory Visit: Payer: Self-pay | Admitting: Family Medicine

## 2016-01-11 ENCOUNTER — Other Ambulatory Visit: Payer: Self-pay | Admitting: Physician Assistant

## 2016-01-14 ENCOUNTER — Telehealth: Payer: Self-pay

## 2016-01-14 NOTE — Telephone Encounter (Signed)
Ok

## 2016-01-14 NOTE — Telephone Encounter (Signed)
Pt states he is out of town and won't be back in until next week but is in dire need of his COZAAR 100 MG and ZOCOR 40 MG. Please call [phone]      [facility]

## 2016-01-15 MED ORDER — LOSARTAN POTASSIUM 100 MG PO TABS: 100.0000 mg | ORAL_TABLET | Freq: Every day | ORAL | Status: DC

## 2016-01-15 MED ORDER — SIMVASTATIN 40 MG PO TABS: 40.0000 mg | ORAL_TABLET | Freq: Every day | ORAL | Status: DC

## 2016-01-15 NOTE — Telephone Encounter (Signed)
I have done this for the patient 

## 2016-01-17 ENCOUNTER — Other Ambulatory Visit: Payer: Self-pay

## 2016-01-17 NOTE — Telephone Encounter (Signed)
Pharm reqs RFs of Viagra. Dr Brigitte Pulse, sending to you since you last saw pt for this.

## 2016-02-07 ENCOUNTER — Other Ambulatory Visit: Payer: Self-pay

## 2016-02-07 MED ORDER — METFORMIN HCL 1000 MG PO TABS: ORAL_TABLET | ORAL | Status: DC

## 2016-02-12 ENCOUNTER — Other Ambulatory Visit: Payer: Self-pay

## 2016-02-12 MED ORDER — SIMVASTATIN 40 MG PO TABS: 40.0000 mg | ORAL_TABLET | Freq: Every day | ORAL | Status: DC

## 2016-02-12 MED ORDER — LOSARTAN POTASSIUM 100 MG PO TABS: 100.0000 mg | ORAL_TABLET | Freq: Every day | ORAL | Status: DC

## 2016-02-20 ENCOUNTER — Other Ambulatory Visit: Payer: Self-pay | Admitting: Emergency Medicine

## 2016-03-08 ENCOUNTER — Ambulatory Visit (INDEPENDENT_AMBULATORY_CARE_PROVIDER_SITE_OTHER): Payer: 59 | Admitting: Family Medicine

## 2016-03-08 VITALS — BP 123/81 | HR 96 | Temp 98.7°F | Ht 68.75 in | Wt 225.0 lb

## 2016-03-08 DIAGNOSIS — I10 Essential (primary) hypertension: Secondary | ICD-10-CM

## 2016-03-08 DIAGNOSIS — R590 Localized enlarged lymph nodes: Secondary | ICD-10-CM

## 2016-03-08 DIAGNOSIS — J069 Acute upper respiratory infection, unspecified: Secondary | ICD-10-CM | POA: Diagnosis not present

## 2016-03-08 DIAGNOSIS — Z131 Encounter for screening for diabetes mellitus: Secondary | ICD-10-CM | POA: Diagnosis not present

## 2016-03-08 DIAGNOSIS — E119 Type 2 diabetes mellitus without complications: Secondary | ICD-10-CM | POA: Diagnosis not present

## 2016-03-08 DIAGNOSIS — E785 Hyperlipidemia, unspecified: Secondary | ICD-10-CM

## 2016-03-08 LAB — POCT CBC
Granulocyte percent: 42.2 %G (ref 37–80)
HCT, POC: 41.5 % — AB (ref 43.5–53.7)
Hemoglobin: 14.7 g/dL (ref 14.1–18.1)
LYMPH, POC: 2.7 (ref 0.6–3.4)
MCH: 31.4 pg — AB (ref 27–31.2)
MCHC: 35.4 g/dL (ref 31.8–35.4)
MCV: 88.7 fL (ref 80–97)
MID (CBC): 0.4 (ref 0–0.9)
MPV: 6.6 fL (ref 0–99.8)
POC Granulocyte: 2.3 (ref 2–6.9)
POC LYMPH PERCENT: 50 %L (ref 10–50)
POC MID %: 7.8 %M (ref 0–12)
Platelet Count, POC: 238 10*3/uL (ref 142–424)
RBC: 4.68 M/uL — AB (ref 4.69–6.13)
RDW, POC: 13.4 %
WBC: 5.4 10*3/uL (ref 4.6–10.2)

## 2016-03-08 LAB — GLUCOSE, POCT (MANUAL RESULT ENTRY): POC Glucose: 325 mg/dl — AB (ref 70–99)

## 2016-03-08 LAB — POCT GLYCOSYLATED HEMOGLOBIN (HGB A1C): Hemoglobin A1C: 11.4

## 2016-03-08 MED ORDER — GLIPIZIDE 10 MG PO TABS: 20.0000 mg | ORAL_TABLET | Freq: Two times a day (BID) | ORAL | Status: DC

## 2016-03-08 MED ORDER — AZITHROMYCIN 250 MG PO TABS
ORAL_TABLET | ORAL | Status: DC
Start: 2016-03-08 — End: 2016-10-21

## 2016-03-08 MED ORDER — PIOGLITAZONE HCL 15 MG PO TABS: 15.0000 mg | ORAL_TABLET | Freq: Every day | ORAL | Status: DC

## 2016-03-08 MED ORDER — LOSARTAN POTASSIUM 100 MG PO TABS: 100.0000 mg | ORAL_TABLET | Freq: Every day | ORAL | Status: DC

## 2016-03-08 MED ORDER — METFORMIN HCL 1000 MG PO TABS: ORAL_TABLET | ORAL | Status: DC

## 2016-03-08 MED ORDER — SIMVASTATIN 40 MG PO TABS: 40.0000 mg | ORAL_TABLET | Freq: Every day | ORAL | Status: DC

## 2016-03-08 NOTE — Patient Instructions (Addendum)
  Referral will be made to a diabetic specialist  Continue your current medications  Be very careful on avoiding sweets and starches (carbohydrates)  Get regular daily exercise  Take azithromycin 2 pills initially, then 1 daily for 4 days for the infection  Return if worse or not improving    IF you received an x-ray today, you will receive an invoice from [facility]. Please contact [facility] at [phone] with questions or concerns regarding your invoice.   IF you received labwork today, you will receive an invoice from [employer]. Please contact [facility] at [phone] with questions or concerns regarding your invoice.   Our billing staff will not be able to assist you with questions regarding bills from these companies.  You will be contacted with the lab results as soon as they are available. The fastest way to get your results is to activate your My Chart account. Instructions are located on the last page of this paperwork. If you have not heard from [place] regarding the results in 2 weeks, please contact this office.

## 2016-03-08 NOTE — Progress Notes (Signed)
Patient ID: Alexander [name], male    DOB: [birth year]  Age: 50 y.o. MRN: [id number]  Chief Complaint  Patient presents with  . Cough    x 5 days - was traveling in [place]  . Sore Throat  . Medication Refill    needs refils on Losartan, simvastatin, glipizide    Subjective:   50 year old man who has a history of diabetes and high lipids. He is here to get those checked. He was sick starting Monday. He is had a sore throat and head congestion and cough. He has drainage. He is coughing up some mucus. His sugars have been 200 when checked. He gets some fairly regular exercise. 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 No headaches or dizziness. No chest pains or excessive shortness of breath. Bowels and kidneys act normally.  Current allergies, medications, problem list, past/family and social histories reviewed.  Objective:  BP 123/81 mmHg  Pulse 96  Temp(Src) 98.7 F (37.1 C) (Oral)  Ht 5' 8.75" (1.746 m)  Wt 225 lb (102.059 kg)  BMI 33.48 kg/m2  SpO2 96%  Alert and oriented. TMs are normal. Throat clear clear. Neck supple with small nodes. Chest is clear to auscultation. Heart regular without murmur. Abdomen soft and nontender. He has normal sensation in his feet and good pulses.  Assessment & Plan:   Assessment: 1. Acute upper respiratory infection   2. Type 2 diabetes mellitus without complication, without long-term current use of insulin ([place])   3. Essential hypertension   4. Hyperlipemia   5. Cervical lymphadenopathy       Plan: Viral flulike illness. Diabetes hypertension and hyperlipidemia. Check labs and decide treatments. He has been out of most of his medicines since Thursday, took the metformin this morning.  Orders Placed This Encounter  Procedures  . Comprehensive metabolic panel  . Lipid panel  . Ambulatory referral to Endocrinology    Referral Priority:  Routine    Referral Type:  Consultation    Referral Reason:  Specialty Services Required     Requested Specialty:  Endocrinology    Number of Visits Requested:  1  . POCT CBC  . POCT glucose (manual entry)  . POCT glycosylated hemoglobin (Hb A1C)    Meds ordered this encounter  Medications  . losartan (COZAAR) 100 MG tablet    Sig: Take 1 tablet (100 mg total) by mouth daily.    Dispense:  90 tablet    Refill:  3    NO MORE REFILLS WITHOUT OFFICE VISIT - 2ND NOTICE  . metFORMIN (GLUCOPHAGE) 1000 MG tablet    Sig: TAKE 1 TABLET (1,000 MG TOTAL) BY MOUTH 2 (TWO) TIMES DAILY WITH A MEAL    Dispense:  180 tablet    Refill:  3    Office visit needed for refills  . simvastatin (ZOCOR) 40 MG tablet    Sig: Take 1 tablet (40 mg total) by mouth daily.    Dispense:  90 tablet    Refill:  3    NO MORE REFILLS WITHOUT OFFICE VISIT - 2ND NOTICE  . pioglitazone (ACTOS) 15 MG tablet    Sig: Take 1 tablet (15 mg total) by mouth daily.    Dispense:  90 tablet    Refill:  3  . glipiZIDE (GLUCOTROL) 10 MG tablet    Sig: Take 2 tablets (20 mg total) by mouth 2 (two) times daily before a meal.    Dispense:  120 tablet  Refill:  5  . azithromycin (ZITHROMAX) 250 MG tablet    Sig: Take 2 tabs PO x 1 dose, then 1 tab PO QD x 4 days    Dispense:  6 tablet    Refill:  0     Results for orders placed or performed in visit on 03/08/16  POCT CBC  Result Value Ref Range   WBC 5.4 4.6 - 10.2 K/uL   Lymph, poc 2.7 0.6 - 3.4   POC LYMPH PERCENT 50.0 10 - 50 %L   MID (cbc) 0.4 0 - 0.9   POC MID % 7.8 0 - 12 %M   POC Granulocyte 2.3 2 - 6.9   Granulocyte percent 42.2 37 - 80 %G   RBC 4.68 (A) 4.69 - 6.13 M/uL   Hemoglobin 14.7 14.1 - 18.1 g/dL   HCT, POC 41.5 (A) 43.5 - 53.7 %   MCV 88.7 80 - 97 fL   MCH, POC 31.4 (A) 27 - 31.2 pg   MCHC 35.4 31.8 - 35.4 g/dL   RDW, POC 13.4 %   Platelet Count, POC 238 142 - 424 K/uL   MPV 6.6 0 - 99.8 fL  POCT glucose (manual entry)  Result Value Ref Range   POC Glucose 325 (A) 70 - 99 mg/dl  POCT glycosylated hemoglobin (Hb A1C)  Result  Value Ref Range   Hemoglobin A1C 11.4      We will see if an endocrinologist can help his diabetes. He apparently has not taken them, because of cost. 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  Patient Instructions    Referral will be made to a diabetic specialist  Continue your current medications  Be very careful on avoiding sweets and starches (carbohydrates)  Get regular daily exercise  Take azithromycin 2 pills initially, then 1 daily for 4 days for the infection  Return if worse or not improving    IF you received an x-ray today, you will receive an invoice from [facility]. Please contact [facility]iology at [phone] with questions or concerns regarding your invoice.   IF you received labwork today, you will receive an invoice from [employer]. Please contact [facility] at [phone] with questions or concerns regarding your invoice.   Our billing staff will not be able to assist you with questions regarding bills from these companies.  You will be contacted with the lab results as soon as they are available. The fastest way to get your results is to activate your My Chart account. Instructions are located on the last page of this paperwork. If you have not heard from [place] regarding the results in 2 weeks, please contact this office.            Return in about 3 months (around 06/08/2016).   HOPPER,DAVID, MD 03/08/2016

## 2016-03-09 LAB — LIPID PANEL
CHOLESTEROL: 150 mg/dL (ref 125–200)
HDL: 66 mg/dL (ref >=40)
LDL Cholesterol: 69 mg/dL (ref <130)
TRIGLYCERIDES: 75 mg/dL (ref <150)
Total CHOL/HDL Ratio: 2.3 Ratio (ref <=5.0)
VLDL: 15 mg/dL (ref <30)

## 2016-03-09 LAB — COMPREHENSIVE METABOLIC PANEL
ALBUMIN: 4.5 g/dL (ref 3.6–5.1)
ALK PHOS: 56 U/L (ref 40–115)
ALT: 19 U/L (ref 9–46)
AST: 16 U/L (ref 10–35)
BILIRUBIN TOTAL: 0.6 mg/dL (ref 0.2–1.2)
BUN: 10 mg/dL (ref 7–25)
CO2: 29 mmol/L (ref 20–31)
CREATININE: 0.8 mg/dL (ref 0.70–1.33)
Calcium: 9.3 mg/dL (ref 8.6–10.3)
Chloride: 101 mmol/L (ref 98–110)
Glucose, Bld: 315 mg/dL — ABNORMAL HIGH (ref 65–99)
Potassium: 4.3 mmol/L (ref 3.5–5.3)
SODIUM: 140 mmol/L (ref 135–146)
Total Protein: 7.4 g/dL (ref 6.1–8.1)

## 2016-03-11 ENCOUNTER — Other Ambulatory Visit: Payer: Self-pay | Admitting: Emergency Medicine

## 2016-04-10 ENCOUNTER — Other Ambulatory Visit: Payer: Self-pay | Admitting: Emergency Medicine

## 2016-05-23 ENCOUNTER — Other Ambulatory Visit: Payer: Self-pay

## 2016-05-23 DIAGNOSIS — E119 Type 2 diabetes mellitus without complications: Secondary | ICD-10-CM

## 2016-05-23 DIAGNOSIS — E785 Hyperlipidemia, unspecified: Secondary | ICD-10-CM

## 2016-05-23 DIAGNOSIS — I10 Essential (primary) hypertension: Secondary | ICD-10-CM

## 2016-05-23 MED ORDER — GLIPIZIDE 10 MG PO TABS: 360.0000 mg | ORAL_TABLET | Freq: Two times a day (BID) | ORAL | Status: DC

## 2016-05-27 ENCOUNTER — Other Ambulatory Visit: Payer: Self-pay

## 2016-05-27 DIAGNOSIS — E119 Type 2 diabetes mellitus without complications: Secondary | ICD-10-CM

## 2016-05-27 DIAGNOSIS — I10 Essential (primary) hypertension: Secondary | ICD-10-CM

## 2016-05-27 DIAGNOSIS — E785 Hyperlipidemia, unspecified: Secondary | ICD-10-CM

## 2016-05-27 MED ORDER — GLIPIZIDE 10 MG PO TABS: 20.0000 mg | ORAL_TABLET | Freq: Two times a day (BID) | ORAL | Status: DC

## 2016-05-27 NOTE — Telephone Encounter (Signed)
Resending Rx w/incorrect sig sent on 6/2.

## 2016-07-29 ENCOUNTER — Other Ambulatory Visit: Payer: Self-pay | Admitting: Emergency Medicine

## 2016-07-29 DIAGNOSIS — E119 Type 2 diabetes mellitus without complications: Secondary | ICD-10-CM

## 2016-07-29 DIAGNOSIS — E785 Hyperlipidemia, unspecified: Secondary | ICD-10-CM

## 2016-07-29 DIAGNOSIS — I10 Essential (primary) hypertension: Secondary | ICD-10-CM

## 2016-08-07 ENCOUNTER — Other Ambulatory Visit: Payer: Self-pay | Admitting: Family Medicine

## 2016-08-07 DIAGNOSIS — I10 Essential (primary) hypertension: Secondary | ICD-10-CM

## 2016-08-07 DIAGNOSIS — E119 Type 2 diabetes mellitus without complications: Secondary | ICD-10-CM

## 2016-08-07 DIAGNOSIS — J069 Acute upper respiratory infection, unspecified: Secondary | ICD-10-CM

## 2016-08-07 DIAGNOSIS — E785 Hyperlipidemia, unspecified: Secondary | ICD-10-CM

## 2016-08-07 DIAGNOSIS — R590 Localized enlarged lymph nodes: Secondary | ICD-10-CM

## 2016-08-12 ENCOUNTER — Other Ambulatory Visit: Payer: Self-pay | Admitting: Emergency Medicine

## 2016-08-12 DIAGNOSIS — E119 Type 2 diabetes mellitus without complications: Secondary | ICD-10-CM

## 2016-08-12 DIAGNOSIS — I10 Essential (primary) hypertension: Secondary | ICD-10-CM

## 2016-08-12 DIAGNOSIS — E785 Hyperlipidemia, unspecified: Secondary | ICD-10-CM

## 2016-10-21 ENCOUNTER — Ambulatory Visit (INDEPENDENT_AMBULATORY_CARE_PROVIDER_SITE_OTHER): Payer: 59 | Admitting: Physician Assistant

## 2016-10-21 VITALS — BP 122/72 | HR 74 | Temp 98.4°F | Resp 17 | Ht 69.5 in | Wt 230.0 lb

## 2016-10-21 DIAGNOSIS — Z79899 Other long term (current) drug therapy: Secondary | ICD-10-CM | POA: Diagnosis not present

## 2016-10-21 DIAGNOSIS — N481 Balanitis: Secondary | ICD-10-CM | POA: Diagnosis not present

## 2016-10-21 DIAGNOSIS — B353 Tinea pedis: Secondary | ICD-10-CM

## 2016-10-21 DIAGNOSIS — E119 Type 2 diabetes mellitus without complications: Secondary | ICD-10-CM | POA: Diagnosis not present

## 2016-10-21 DIAGNOSIS — I10 Essential (primary) hypertension: Secondary | ICD-10-CM | POA: Diagnosis not present

## 2016-10-21 DIAGNOSIS — N529 Male erectile dysfunction, unspecified: Secondary | ICD-10-CM | POA: Diagnosis not present

## 2016-10-21 DIAGNOSIS — E785 Hyperlipidemia, unspecified: Secondary | ICD-10-CM | POA: Diagnosis not present

## 2016-10-21 DIAGNOSIS — R7309 Other abnormal glucose: Secondary | ICD-10-CM | POA: Diagnosis not present

## 2016-10-21 LAB — COMPLETE METABOLIC PANEL WITH GFR
ALT: 18 U/L (ref 9–46)
Calcium: 9.3 mg/dL (ref 8.6–10.3)
Creat: 0.79 mg/dL (ref 0.70–1.33)
GFR, Est African American: >89 mL/min (ref >=60)
GFR, Est Non African American: >89 mL/min (ref >=60)
Total Bilirubin: 1.1 mg/dL (ref 0.2–1.2)
Total Protein: 7.4 g/dL (ref 6.1–8.1)

## 2016-10-21 LAB — COMPLETE METABOLIC PANEL WITHOUT GFR
AST: 14 U/L (ref 10–35)
Albumin: 4.5 g/dL (ref 3.6–5.1)
Alkaline Phosphatase: 47 U/L (ref 40–115)
BUN: 13 mg/dL (ref 7–25)
CO2: 24 mmol/L (ref 20–31)
Chloride: 103 mmol/L (ref 98–110)
Glucose, Bld: 260 mg/dL — ABNORMAL HIGH (ref 65–99)
Potassium: 4.4 mmol/L (ref 3.5–5.3)
Sodium: 138 mmol/L (ref 135–146)

## 2016-10-21 LAB — LIPID PANEL
Cholesterol: 164 mg/dL (ref 125–200)
HDL: 65 mg/dL (ref >=40)
LDL Cholesterol: 68 mg/dL (ref <130)
Total CHOL/HDL Ratio: 2.5 Ratio (ref <=5.0)
Triglycerides: 157 mg/dL — ABNORMAL HIGH (ref <150)
VLDL: 31 mg/dL — ABNORMAL HIGH (ref <30)

## 2016-10-21 LAB — HEMOGLOBIN A1C
Hgb A1c MFr Bld: 11.2 % — ABNORMAL HIGH (ref <5.7)
Mean Plasma Glucose: 275 mg/dL

## 2016-10-21 MED ORDER — FLUCONAZOLE 150 MG PO TABS: 150.0000 mg | ORAL_TABLET | Freq: Once | ORAL | 0 refills | Status: AC

## 2016-10-21 MED ORDER — LOSARTAN POTASSIUM 100 MG PO TABS: 100.0000 mg | ORAL_TABLET | Freq: Every day | ORAL | 3 refills | Status: DC

## 2016-10-21 MED ORDER — TERBINAFINE HCL 1 % EX CREA: 1.0000 "application " | TOPICAL_CREAM | Freq: Two times a day (BID) | CUTANEOUS | 0 refills | Status: AC

## 2016-10-21 MED ORDER — METFORMIN HCL 1000 MG PO TABS: ORAL_TABLET | ORAL | 3 refills | Status: DC

## 2016-10-21 MED ORDER — SIMVASTATIN 40 MG PO TABS: 40.0000 mg | ORAL_TABLET | Freq: Every day | ORAL | 3 refills | Status: DC

## 2016-10-21 MED ORDER — CLOTRIMAZOLE 1 % EX CREA: 1.0000 "application " | TOPICAL_CREAM | Freq: Two times a day (BID) | CUTANEOUS | 0 refills | Status: DC

## 2016-10-21 MED ORDER — VIAGRA 100 MG PO TABS: 100.0000 mg | ORAL_TABLET | ORAL | 1 refills | Status: DC | PRN
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
## 2016-10-21 NOTE — Patient Instructions (Addendum)
Please do not take your Simvastatin with Fluconazole   Clotrimazole cream is for your penis. Use this twice a day for 7-14 days.  Lamisil is for your toes. Two times a day for 3 weeks.    Thank you for coming in today. I hope you feel we met your needs.  Feel free to call [facility] if you have any questions or further requests.  Please consider signing up for MyChart if you do not already have it, as this is a great way to communicate with me.  Best,  Whitney McVey, PA-C  Balanitis Balanitis is inflammation of the head of the penis (glans).  CAUSES  Balanitis has multiple causes, both infectious and noninfectious. Often balanitis is the result of poor personal hygiene, especially in uncircumcised males. Without adequate washing, viruses, bacteria, and yeast collect between the foreskin and the glans. This can cause an infection. Lack of air and irritation from a normal secretion called smegma contribute to the cause in uncircumcised males. Other causes include:  Chemical irritation from the use of certain soaps and shower gels (especially soaps with perfumes), condoms, personal lubricants, petroleum jelly, spermicides, and fabric conditioners.  Skin conditions, such as eczema, dermatitis, and psoriasis.  Allergies to drugs, such as tetracycline and sulfa.  Certain medical conditions, including liver cirrhosis, congestive heart failure, and kidney disease.  Morbid obesity. RISK FACTORS  Diabetes mellitus.  A tight foreskin that is difficult to pull back past the glans (phimosis).  Sex without the use of a condom. SIGNS AND SYMPTOMS  Symptoms may include:  Discharge coming from under the foreskin.  Tenderness.  Itching and inability to get an erection (because of the pain).  Redness and a rash.  Sores on the glans and on the foreskin. DIAGNOSIS Diagnosis of balanitis is confirmed through a physical exam. TREATMENT The treatment is based on the cause of the balanitis.  Treatment may include:  Frequent cleansing.  Keeping the glans and foreskin dry.  Use of medicines such as creams, pain medicines, antibiotics, or medicines to treat fungal infections.  Sitz baths. If the irritation has caused a scar on the foreskin that prevents easy retraction, a circumcision may be recommended.  HOME CARE INSTRUCTIONS  Sex should be avoided until the condition has cleared. MAKE SURE YOU:  Understand these instructions.  Will watch your condition.  Will get help right away if you are not doing well or get worse.   This information is not intended to replace advice given to you by your health care provider. Make sure you discuss any questions you have with your health care provider.   Diabetes and Exercise Exercising regularly is important. It is not just about losing weight. It has many health benefits, such as:  Improving your overall fitness, flexibility, and endurance.  Increasing your bone density.  Helping with weight control.  Decreasing your body fat.  Increasing your muscle strength.  Reducing stress and tension.  Improving your overall health. People with diabetes who exercise gain additional benefits because exercise:  Reduces appetite.  Improves the body's use of blood sugar (glucose).  Helps lower or control blood glucose.  Decreases blood pressure.  Helps control blood lipids (such as cholesterol and triglycerides).  Improves the body's use of the hormone insulin by:  Increasing the body's insulin sensitivity.  Reducing the body's insulin needs.  Decreases the risk for heart disease because exercising:  Lowers cholesterol and triglycerides levels.  Increases the levels of good cholesterol (such as high-density lipoproteins [HDL])  in the body.  Lowers blood glucose levels. YOUR ACTIVITY PLAN  Choose an activity that you enjoy, and set realistic goals. To exercise safely, you should begin practicing any new physical  activity slowly, and gradually increase the intensity of the exercise over time. Your health care provider or diabetes educator can help create an activity plan that works for you. General recommendations include:  Encouraging children to engage in at least 60 minutes of physical activity each day.  Stretching and performing strength training exercises, such as yoga or weight lifting, at least 2 times per week.  Performing a total of at least 150 minutes of moderate-intensity exercise each week, such as brisk walking or water aerobics.  Exercising at least 3 days per week, making sure you allow no more than 2 consecutive days to pass without exercising.  Avoiding long periods of inactivity (90 minutes or more). When you have to spend an extended period of time sitting down, take frequent breaks to walk or stretch. RECOMMENDATIONS FOR EXERCISING WITH TYPE 1 OR TYPE 2 DIABETES   Check your blood glucose before exercising. If blood glucose levels are greater than 240 mg/dL, check for urine ketones. Do not exercise if ketones are present.  Avoid injecting insulin into areas of the body that are going to be exercised. For example, avoid injecting insulin into:  The arms when playing tennis.  The legs when jogging.  Keep a record of:  Food intake before and after you exercise.  Expected peak times of insulin action.  Blood glucose levels before and after you exercise.  The type and amount of exercise you have done.  Review your records with your health care provider. Your health care provider will help you to develop guidelines for adjusting food intake and insulin amounts before and after exercising.  If you take insulin or oral hypoglycemic agents, watch for signs and symptoms of hypoglycemia. They include:  Dizziness.  Shaking.  Sweating.  Chills.  Confusion.  Drink plenty of water while you exercise to prevent dehydration or heat stroke. Body water is lost during exercise  and must be replaced.  Talk to your health care provider before starting an exercise program to make sure it is safe for you. Remember, almost any type of activity is better than none.   This information is not intended to replace advice given to you by your health care provider. Make sure you discuss any questions you have with your health care provider.   Document Released: 02/28/2004 Document Revised: 04/24/2015 Document Reviewed: 05/17/2013 Elsevier Interactive Patient Education 2016 [employer].  IF you received an x-ray today, you will receive an invoice from [facility]. Please contact [facility] at [phone] with questions or concerns regarding your invoice.   IF you received labwork today, you will receive an invoice from [facility]. Please contact [facility] at [phone] with questions or concerns regarding your invoice.   Our billing staff will not be able to assist you with questions regarding bills from these companies.  You will be contacted with the lab results as soon as they are available. The fastest way to get your results is to activate your My Chart account. Instructions are located on the last page of this paperwork. If you have not heard from [place] regarding the results in 2 weeks, please contact this office.

## 2016-10-24 ENCOUNTER — Encounter: Payer: Self-pay | Admitting: Physician Assistant

## 2016-10-24 NOTE — Addendum Note (Signed)
Addended by: Dorise Hiss on: 10/24/2016 10:56 PM   Modules accepted: Orders

## 2016-10-24 NOTE — Addendum Note (Signed)
Addended by: Dorise Hiss on: 10/24/2016 09:41 AM   Modules accepted: Orders

## 2016-10-30 ENCOUNTER — Ambulatory Visit (INDEPENDENT_AMBULATORY_CARE_PROVIDER_SITE_OTHER): Payer: 59 | Admitting: Physician Assistant

## 2016-10-30 VITALS — BP 116/84 | HR 106 | Temp 98.5°F | Resp 16 | Ht 69.0 in | Wt 232.0 lb

## 2016-10-30 VITALS — BP 116/84 | HR 92 | Temp 98.5°F | Resp 16 | Ht 69.0 in | Wt 232.0 lb

## 2016-10-30 DIAGNOSIS — Z024 Encounter for examination for driving license: Secondary | ICD-10-CM

## 2016-10-30 DIAGNOSIS — E119 Type 2 diabetes mellitus without complications: Secondary | ICD-10-CM | POA: Diagnosis not present

## 2016-10-30 LAB — POCT GLYCOSYLATED HEMOGLOBIN (HGB A1C): Hemoglobin A1C: 11.9

## 2016-10-30 NOTE — Patient Instructions (Signed)
     IF you received an x-ray today, you will receive an invoice from [facility]. Please contact [facility] at [phone] with questions or concerns regarding your invoice.   IF you received labwork today, you will receive an invoice from [facility]. Please contact [facility] at [phone] with questions or concerns regarding your invoice.   Our billing staff will not be able to assist you with questions regarding bills from these companies.  You will be contacted with the lab results as soon as they are available. The fastest way to get your results is to activate your My Chart account. Instructions are located on the last page of this paperwork. If you have not heard from us regarding the results in 2 weeks, please contact this office.      

## 2016-10-30 NOTE — Progress Notes (Signed)
This patient presents for DOT examination for fitness for duty.  Current DOT has been lost - glucose at home BS 150-200s   Medical History:  no  1. Head/Brain Injuries, disorders or illnesses no  2. Seizures, epilepsy no  3. Eye disorders or impaired vision (except corrective lenses) - wears glasses to drive no  4. Ear disorders, loss of hearing or balance no  5. Heart disease or heart attack, other cardiovascular condition no  6. Heart surgery (valve replacement/bypass, angioplasty, pacemaker/defribrillator) yes  7. High blood pressure yes  8. High holesterol no  9. Chronic cough, shortness of breath or other breathing problems no  10. Lung disease (emphysema, asthma or chronic bronchitis) no  11. Kidney disease, dialysis no  12. Digestive problems  yes  13. Diabetes or elevated blood sugar  no  Insulin use no  14. Nervous or psychiatric disorders, e.g., severe depression no  15. Fainting or syncope no  16. Dizziness, headaches, numbness, tingling or memory loss no  17. Unexplained weight loss no 18. Stroke, TIA or paralysis no  19. Missing or impaired hand, arm, foot, leg, finger, toe no  20. Spinal injury or disease no  21. Bone, muscles or nerve problems no  22. Blood clots or bleeding bleeding disorders no  23. Cancer no  24. Chronic infection or other chronic diseases no  25. Sleep disorders, pauses in breathing while asleep, daytime sleepiness, loud snoring yes  26. Have you ever had a sleep test? - no sleep apnea no  27.  Have you ever spent a night in the hospital? no  28. Have you ever had a broken bone? no  29. Have you or or do you use tobacco products? no  30. Regular, frequent alcohol use no  31. Illegal substance use within the past 2 years no  32.  Have you ever failed a drug test or been dependent on an illegal substance?  Current Medications: Prior to Admission medications   Medication Sig Start Date End Date Taking? Authorizing Provider  aspirin 81 MG  tablet Take 81 mg by mouth every other day.   Yes Historical Provider, MD  clotrimazole (LOTRIMIN) 1 % cream Apply 1 application topically 2 (two) times daily. 10/21/16  Yes Elizabeth Whitney McVey, PA-C  glipiZIDE (GLUCOTROL) 10 MG tablet TAKE 2 TABLETS BY MOUTH 2  TIMES DAILY BEFORE A MEAL 07/31/16  Yes Darlyne Russian, MD  losartan (COZAAR) 100 MG tablet Take 1 tablet (100 mg total) by mouth daily. 10/21/16  Yes Elizabeth Whitney McVey, PA-C  metFORMIN (GLUCOPHAGE) 1000 MG tablet TAKE 1 TABLET BY MOUTH 2  TIMES DAILY WITH A MEAL 04/11/16  Yes Posey Boyer, MD  Multiple Vitamin (MULTIVITAMIN) tablet Take 1 tablet by mouth daily.   Yes Historical Provider, MD  pioglitazone (ACTOS) 15 MG tablet Take 1 tablet (15 mg total) by mouth daily. 03/08/16  Yes Posey Boyer, MD  simvastatin (ZOCOR) 40 MG tablet Take 1 tablet by mouth  daily  Need appointment for  refills 03/14/16  Yes Posey Boyer, MD  terbinafine (LAMISIL) 1 % cream Apply 1 application topically 2 (two) times daily. 10/21/16 11/11/16 Yes Elizabeth Whitney McVey, PA-C  VIAGRA 100 MG tablet Take 1 tablet (100 mg total) by mouth as needed for erectile dysfunction. 10/21/16  Yes Gelene Mink McVey, PA-C    Medical Examiner's Comments on Health History:  On medications for DM, HTN and elevated cholesterol  TESTING:   Visual Acuity Screening   Right  eye Left eye Both eyes  Without correction:     With correction: 20/20 20/25 20/20   Comments: The patient can distinguish the colors red, amber and green.  Peripheral Vision: Right eye 70 degrees. Left eye 85 degrees.  Hearing Screening Comments: The patient was able to hear a forced whisper from 10 feet.  Monocular Vision: No.  Hearing Aid used for test: No. Hearing Aid required to to meet standard: No.  BP 116/84   Pulse 92   Temp 98.5 F (36.9 C)   Resp 16   Ht 5\' 9"  (1.753 m)   Wt 232 lb (105.2 kg)   SpO2 97%   BMI 34.26 kg/m  Pulse rate is regular  Comments: SG:  1.025, BLO: Neg, PRO: Trace, GLU: 2 or more (2,000 or more)  PHYSICAL EXAMINATION:  1. No. General Appearance: Marked overweight, tremor, signs of alcoholism, problem drinking or drug abuse. 2. No. Skin Exam - tattoos, scars 3. No. Eyes: pupillary equality, reaction to light, accommodation, ocular motility, ocular muscle imbalance, extra ocular movement, nystagmus, exopthalmos. Ask about retinopathy, cataracts, aphakia, glaucoma, macular degeneration and refer to a specialist if appropriate.  4. No. Ears: Scarring of tympanic membrane, occlusion of external canal, perforated eardrums.     5. No. Mouth and Throat: Irremedial deformities likely to interfere with breathing or swallowing.    6. No. Heart: Murmurs, extra sounds, enlarged heart, pacemaker, implantable defibrillator.     7. No. Lungs and Chest, not including breast examination: Abnormal Chest wall expansion, abnormal respiratory rate, abnormal breath sounds including wheezes or alveolar rates, impaired respiratory function, cyanosis. Abnormal findings on physical exam may require further testing such as pulmonary tests and/or x ray of chest.  8. No. Abdomen and Viscera: Enlarged liver, enlarged spleen, masses, bruits, hernia, significant abdominal wall muscle weakness.  9. No. Genitourinary System: Hernia  10. No. Spine, other musculoskeletal: Previous surgery, deformities, limitation of motion, tenderness. 11. No. Extremities-Limb impaired: Loss or impairment of leg, foot, toe, arm, hand, finger. Perceptible limp, deformities, atrophy, weakness, paralysis, clubbing, edema, hypotonia. Insufficient grasp and prehension to maintain steering wheel grip. Insufficient mobility and strength in lower limb to operate pedals properly. 12. No. Neurological: Impaired equilibrium, coordination or speech pattern; paresthesia, asymmetric deep tendon reflexes, sensory or positional abnormalities, abnormal patellar and Babinski's reflexes 13. No. Gait -  antalgic, ataxia  14. No. Vascular System: Abnormal pulse and amplitude, carotid or arterial bruits, varicose veins.   Does not meet standards. Certification Status: does not meet standards for 2 year certificate. Meets standards, but periodic monitoring required due to: uncontrolled DM  Driver qualified only for: 6 months   Wearing corrective lenses: yes Wearing hearing aid: no Accompanied by a no waiver/exemption   Certification expires 04/29/2017  Windell Hummingbird PA-C  [facility] 10/30/2016 5:07 PM
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
## 2016-12-01 ENCOUNTER — Ambulatory Visit: Payer: Managed Care, Other (non HMO) | Admitting: Internal Medicine

## 2017-02-24 ENCOUNTER — Other Ambulatory Visit: Payer: Self-pay | Admitting: Emergency Medicine

## 2017-02-24 DIAGNOSIS — I10 Essential (primary) hypertension: Secondary | ICD-10-CM

## 2017-02-24 DIAGNOSIS — E119 Type 2 diabetes mellitus without complications: Secondary | ICD-10-CM

## 2017-02-24 DIAGNOSIS — E785 Hyperlipidemia, unspecified: Secondary | ICD-10-CM

## 2017-02-24 NOTE — Telephone Encounter (Signed)
Pt calling in to request his glipizide    Please advise [phone]

## 2017-04-09 ENCOUNTER — Other Ambulatory Visit: Payer: Self-pay | Admitting: Physician Assistant

## 2017-04-09 DIAGNOSIS — N529 Male erectile dysfunction, unspecified: Secondary | ICD-10-CM

## 2017-04-10 NOTE — Telephone Encounter (Signed)
10/2016 last ov 

## 2017-09-05 ENCOUNTER — Other Ambulatory Visit: Payer: Self-pay | Admitting: Physician Assistant

## 2017-09-05 DIAGNOSIS — N529 Male erectile dysfunction, unspecified: Secondary | ICD-10-CM

## 2017-10-19 ENCOUNTER — Other Ambulatory Visit: Payer: Self-pay | Admitting: Physician Assistant

## 2017-10-19 DIAGNOSIS — I10 Essential (primary) hypertension: Secondary | ICD-10-CM

## 2017-10-19 DIAGNOSIS — E119 Type 2 diabetes mellitus without complications: Secondary | ICD-10-CM

## 2017-10-19 DIAGNOSIS — E785 Hyperlipidemia, unspecified: Secondary | ICD-10-CM

## 2017-11-14 ENCOUNTER — Encounter (HOSPITAL_COMMUNITY): Payer: Self-pay | Admitting: Nurse Practitioner

## 2017-11-14 ENCOUNTER — Emergency Department (HOSPITAL_COMMUNITY)
Admission: EM | Admit: 2017-11-14 | Discharge: 2017-11-14 | Disposition: A | Discharge status: Home / Self Care | Payer: 59 | Attending: Emergency Medicine | Admitting: Emergency Medicine

## 2017-11-14 DIAGNOSIS — L02214 Cutaneous abscess of groin: Secondary | ICD-10-CM | POA: Diagnosis not present

## 2017-11-14 DIAGNOSIS — Z7982 Long term (current) use of aspirin: Secondary | ICD-10-CM | POA: Insufficient documentation

## 2017-11-14 DIAGNOSIS — I10 Essential (primary) hypertension: Secondary | ICD-10-CM | POA: Diagnosis not present

## 2017-11-14 DIAGNOSIS — Z7984 Long term (current) use of oral hypoglycemic drugs: Secondary | ICD-10-CM | POA: Diagnosis not present

## 2017-11-14 DIAGNOSIS — Z79899 Other long term (current) drug therapy: Secondary | ICD-10-CM | POA: Insufficient documentation

## 2017-11-14 DIAGNOSIS — E119 Type 2 diabetes mellitus without complications: Secondary | ICD-10-CM | POA: Insufficient documentation

## 2017-11-14 MED ORDER — MORPHINE SULFATE 15 MG PO TABS: 15.0000 mg | ORAL_TABLET | ORAL | 0 refills | Status: DC | PRN

## 2017-11-14 MED ORDER — DOXYCYCLINE HYCLATE 100 MG PO CAPS: 100.0000 mg | ORAL_CAPSULE | Freq: Two times a day (BID) | ORAL | 0 refills | Status: DC

## 2017-11-14 MED ORDER — LIDOCAINE-EPINEPHRINE 1 %-1:100000 IJ SOLN
20.0000 mL | Freq: Once | INTRAMUSCULAR | Status: AC
  Administered 2017-11-14: 20 mL via INTRADERMAL
  Filled 2017-11-14: qty 20

## 2017-11-14 MED ORDER — LIDOCAINE-EPINEPHRINE (PF) 2 %-1:200000 IJ SOLN
INTRAMUSCULAR | Status: AC
  Administered 2017-11-14: 20 mL
  Filled 2017-11-14: qty 20

## 2017-11-14 NOTE — ED Triage Notes (Signed)
Pt states he has a an abscess/boil on his left groin that he had I&D'ed about 3 weeks while in [place], states that there has been minimal to no improvement and it still draining pus. Denies fever or chills.

## 2017-11-14 NOTE — ED Notes (Signed)
MD floyd at the bedside

## 2017-11-14 NOTE — ED Notes (Signed)
Pt ambulatory and independent at discharge.  Verbalized understanding of discharge instructions 

## 2017-11-14 NOTE — ED Provider Notes (Signed)
[facility] DEPT Provider Note   CSN: [id number] Arrival date & time: 11/14/17  1602     History   Chief Complaint Chief Complaint  Patient presents with  . Abscess    HPI Alexander [name] is a 51 y.o. male.  51 yo M with a chief complaint of a left inguinal abscess.  Is been going on for the past couple weeks.  He was seen in [place] about 2 weeks ago and had it drained.  At that time there is a very mild amount of output.  He was started on 2 different antibiotics.  He had some improvement and then it recurred over the past few days.  He denies fevers or chills denies rapid spreading redness.  Denies nausea or vomiting.  He has had some purulent drainage from the area.   The history is provided by the patient and the spouse.  Abscess  Location:  Pelvis Pelvic abscess location:  Pelvis Abscess quality: draining, induration, painful and redness   Red streaking: no   Duration:  2 weeks Progression:  Worsening Pain details:    Quality:  Aching, sharp and shooting   Severity:  Moderate   Duration:  2 weeks   Timing:  Constant   Progression:  Waxing and waning Chronicity:  New Context: not diabetes, not immunosuppression and not injected drug use   Relieved by:  Nothing Worsened by:  Nothing Ineffective treatments:  None tried Associated symptoms: no fever, no headaches and no vomiting     Past Medical History:  Diagnosis Date  . Diabetes mellitus without complication ([place])   . Hyperlipidemia   . Hypertension     Patient Active Problem List   Diagnosis Date Noted  . Abnormal finding on MRI of brain 04/21/2014  . Impotence of organic origin 09/24/2013  . HTN (hypertension) 12/17/2012  . Diabetes mellitus, type 2 ([place]) 12/17/2012  . Hyperlipidemia 12/17/2012    Past Surgical History:  Procedure Laterality Date  . ORIF HIP FRACTURE Right        Home Medications    Prior to Admission medications   Medication Sig Start Date  End Date Taking? Authorizing Provider  aspirin 81 MG tablet Take 81 mg by mouth every other day.    [provider]  clotrimazole (LOTRIMIN) 1 % cream Apply 1 application topically 2 (two) times daily. 10/21/16   McVey, Gelene Mink, PA-C  doxycycline (VIBRAMYCIN) 100 MG capsule Take 1 capsule (100 mg total) by mouth 2 (two) times daily. One po bid x 7 days 11/14/17   Deno Etienne, DO  glipiZIDE (GLUCOTROL) 10 MG tablet TAKE 2 TABLETS BY MOUTH 2  TIMES DAILY BEFORE A MEAL 02/24/17   Scot Jun, FNP  losartan (COZAAR) 100 MG tablet TAKE 1 TABLET BY MOUTH  DAILY 10/21/17   Weber, Damaris Hippo, PA-C  metFORMIN (GLUCOPHAGE) 1000 MG tablet TAKE 1 TABLET BY MOUTH 2  TIMES DAILY WITH A MEAL 04/11/16   Posey Boyer, MD  morphine (MSIR) 15 MG tablet Take 1 tablet (15 mg total) by mouth every 4 (four) hours as needed for severe pain. 11/14/17   Deno Etienne, DO  Multiple Vitamin (MULTIVITAMIN) tablet Take 1 tablet by mouth daily.    [provider]  pioglitazone (ACTOS) 15 MG tablet Take 1 tablet (15 mg total) by mouth daily. 03/08/16   Posey Boyer, MD  sildenafil (VIAGRA) 100 MG tablet TAKE 1 TABLET BY MOUTH AS NEEDED FOR ERECTILE DYSFUNCTION 09/07/17  McVey, Gelene Mink, PA-C  simvastatin (ZOCOR) 40 MG tablet Take 1 tablet by mouth  daily  Need appointment for  refills 03/14/16   Posey Boyer, MD  simvastatin (ZOCOR) 40 MG tablet TAKE 1 TABLET BY MOUTH  DAILY 10/21/17   Gale Journey Damaris Hippo, PA-C    Family History Family History  Problem Relation Age of Onset  . Diabetes Father   . Heart attack Father 42       Died age 62 MI    Social History Social History   Tobacco Use  . Smoking status: Never Smoker  . Smokeless tobacco: Never Used  Substance Use Topics  . Alcohol use: Yes    Alcohol/week: 0.0 oz    Comment: social   . Drug use: No     Allergies   Patient has no known allergies.   Review of Systems Review of Systems  Constitutional: Negative for chills  and fever.  HENT: Negative for congestion and facial swelling.   Eyes: Negative for discharge and visual disturbance.  Respiratory: Negative for shortness of breath.   Cardiovascular: Negative for chest pain and palpitations.  Gastrointestinal: Negative for abdominal pain, diarrhea and vomiting.  Musculoskeletal: Negative for arthralgias and myalgias.  Skin: Positive for color change and wound. Negative for rash.  Neurological: Negative for tremors, syncope and headaches.  Psychiatric/Behavioral: Negative for confusion and dysphoric mood.     Physical Exam Updated Vital Signs BP (!) 143/100 (BP Location: Left Arm)   Pulse 93   Temp 97.9 F (36.6 C) (Oral)   Resp 16   Ht 5\' 10"  (1.778 m)   Wt 102.1 kg (225 lb)   SpO2 99%   BMI 32.28 kg/m   Physical Exam  Constitutional: He is oriented to person, place, and time. He appears well-developed and well-nourished.  HENT:  Head: Normocephalic and atraumatic.  Eyes: EOM are normal. Pupils are equal, round, and reactive to light.  Neck: Normal range of motion. Neck supple. No JVD present.  Cardiovascular: Normal rate and regular rhythm. Exam reveals no gallop and no friction rub.  No murmur heard. Pulmonary/Chest: No respiratory distress. He has no wheezes.  Abdominal: He exhibits no distension. There is no rebound and no guarding.  Musculoskeletal: Normal range of motion.  Neurological: He is alert and oriented to person, place, and time.  Skin: No rash noted. No pallor.  Fluctuant area where I would expect the femoral triangle to be with a old laceration just lateral with purulent drainage.  Significant surrounding induration.  Mild surrounding erythema.  Psychiatric: He has a normal mood and affect. His behavior is normal.  Nursing note and vitals reviewed.    ED Treatments / Results  Labs (all labs ordered are listed, but only abnormal results are displayed) Labs Reviewed - No data to display  EKG  EKG  Interpretation None       Radiology No results found.  Procedures .Marland KitchenIncision and Drainage Date/Time: 11/14/2017 7:56 PM Performed by: Deno Etienne, DO Authorized by: Deno Etienne, DO   Consent:    Consent obtained:  Verbal   Consent given by:  Patient   Risks discussed:  Bleeding, incomplete drainage, infection and damage to other organs   Alternatives discussed:  Delayed treatment, alternative treatment and observation Location:    Type:  Abscess   Size:  4cm   Location:  Anogenital   Anogenital location: inguinal  Pre-procedure details:    Skin preparation:  Chloraprep Anesthesia (see MAR for exact dosages):  Anesthesia method:  Local infiltration   Local anesthetic:  Lidocaine 1% WITH epi Procedure type:    Complexity:  Complex Procedure details:    Needle aspiration: no     Incision types:  Single straight   Incision depth:  Submucosal   Wound management:  Probed and deloculated, irrigated with saline and extensive cleaning   Drainage:  Bloody and purulent   Drainage amount:  Copious   Wound treatment:  Wound left open   Packing materials:  None Post-procedure details:    Patient tolerance of procedure:  Tolerated well, no immediate complications   (including critical care time)  Medications Ordered in ED Medications  lidocaine-EPINEPHrine (XYLOCAINE W/EPI) 1 %-1:100000 (with pres) injection 20 mL (not administered)  lidocaine-EPINEPHrine (XYLOCAINE W/EPI) 2 %-1:200000 (PF) injection (20 mLs  Given 11/14/17 1940)     Initial Impression / Assessment and Plan / ED Course  I have reviewed the triage vital signs and the nursing notes.  Pertinent labs & imaging results that were available during my care of the patient were reviewed by me and considered in my medical decision making (see chart for details).     51 yo M with a chief complaint of a left inguinal abscess.  Bedside ultrasound with a large fluid collection however I was unable to visualize the  vasculature in the area.  I very gently went into the old incision and bluntly followed until I opened up a large pocket of abscess.  There is a large amount of pus removed.  Pain improved.  Will start on doxycycline.  Due to the location and the recurrence I will send him to general surgery for follow-up.  Final Clinical Impressions(s) / ED Diagnoses   Final diagnoses:  Inguinal abscess    ED Discharge Orders        Ordered    doxycycline (VIBRAMYCIN) 100 MG capsule  2 times daily     11/14/17 1943    morphine (MSIR) 15 MG tablet  Every 4 hours PRN     11/14/17 1943       Deno Etienne, DO 11/14/17 1956

## 2017-11-23 ENCOUNTER — Inpatient Hospital Stay (HOSPITAL_COMMUNITY)
Admission: EM | Admit: 2017-11-23 | Discharge: 2017-11-25 | DRG: 603 | Disposition: A | Discharge status: Home / Self Care | Payer: 59 | Attending: Surgery | Admitting: Surgery

## 2017-11-23 ENCOUNTER — Emergency Department (HOSPITAL_COMMUNITY): Payer: 59

## 2017-11-23 ENCOUNTER — Encounter (HOSPITAL_COMMUNITY): Payer: Self-pay | Admitting: Emergency Medicine

## 2017-11-23 ENCOUNTER — Other Ambulatory Visit: Payer: Self-pay

## 2017-11-23 DIAGNOSIS — E785 Hyperlipidemia, unspecified: Secondary | ICD-10-CM | POA: Diagnosis present

## 2017-11-23 DIAGNOSIS — E1165 Type 2 diabetes mellitus with hyperglycemia: Secondary | ICD-10-CM | POA: Diagnosis present

## 2017-11-23 DIAGNOSIS — Z7984 Long term (current) use of oral hypoglycemic drugs: Secondary | ICD-10-CM | POA: Diagnosis not present

## 2017-11-23 DIAGNOSIS — Z6831 Body mass index (BMI) 31.0-31.9, adult: Secondary | ICD-10-CM

## 2017-11-23 DIAGNOSIS — L0291 Cutaneous abscess, unspecified: Secondary | ICD-10-CM

## 2017-11-23 DIAGNOSIS — L02416 Cutaneous abscess of left lower limb: Principal | ICD-10-CM | POA: Diagnosis present

## 2017-11-23 DIAGNOSIS — I10 Essential (primary) hypertension: Secondary | ICD-10-CM | POA: Diagnosis present

## 2017-11-23 DIAGNOSIS — Z79899 Other long term (current) drug therapy: Secondary | ICD-10-CM | POA: Diagnosis not present

## 2017-11-23 DIAGNOSIS — E119 Type 2 diabetes mellitus without complications: Secondary | ICD-10-CM

## 2017-11-23 LAB — CBC WITH DIFFERENTIAL/PLATELET
BASOS ABS: 0 10*3/uL (ref 0.0–0.1)
BASOS PCT: 1 %
EOS ABS: 0.2 10*3/uL (ref 0.0–0.7)
Eosinophils Relative: 4 %
HEMATOCRIT: 36.1 % — AB (ref 39.0–52.0)
HEMOGLOBIN: 12.2 g/dL — AB (ref 13.0–17.0)
Lymphocytes Relative: 43 %
Lymphs Abs: 2.7 10*3/uL (ref 0.7–4.0)
MCH: 29.5 pg (ref 26.0–34.0)
MCHC: 33.8 g/dL (ref 30.0–36.0)
MCV: 87.4 fL (ref 78.0–100.0)
Monocytes Absolute: 0.4 10*3/uL (ref 0.1–1.0)
Monocytes Relative: 6 %
NEUTROS ABS: 2.9 10*3/uL (ref 1.7–7.7)
NEUTROS PCT: 46 %
Platelets: 265 10*3/uL (ref 150–400)
RBC: 4.13 MIL/uL — ABNORMAL LOW (ref 4.22–5.81)
RDW: 12.9 % (ref 11.5–15.5)
WBC: 6.2 10*3/uL (ref 4.0–10.5)

## 2017-11-23 LAB — COMPREHENSIVE METABOLIC PANEL
ALK PHOS: 61 U/L (ref 38–126)
ALT: 13 U/L — AB (ref 17–63)
ANION GAP: 9 (ref 5–15)
AST: 18 U/L (ref 15–41)
Albumin: 3.1 g/dL — ABNORMAL LOW (ref 3.5–5.0)
BILIRUBIN TOTAL: 0.7 mg/dL (ref 0.3–1.2)
BUN: 10 mg/dL (ref 6–20)
CALCIUM: 8.7 mg/dL — AB (ref 8.9–10.3)
CO2: 24 mmol/L (ref 22–32)
CREATININE: 0.75 mg/dL (ref 0.61–1.24)
Chloride: 104 mmol/L (ref 101–111)
Glucose, Bld: 336 mg/dL — ABNORMAL HIGH (ref 65–99)
Potassium: 4.2 mmol/L (ref 3.5–5.1)
SODIUM: 137 mmol/L (ref 135–145)
TOTAL PROTEIN: 6.7 g/dL (ref 6.5–8.1)

## 2017-11-23 LAB — HEMOGLOBIN A1C
HEMOGLOBIN A1C: 12.1 % — AB (ref 4.8–5.6)
MEAN PLASMA GLUCOSE: 300.57 mg/dL

## 2017-11-23 LAB — CBG MONITORING, ED: GLUCOSE-CAPILLARY: 288 mg/dL — AB (ref 65–99)

## 2017-11-23 MED ORDER — SODIUM CHLORIDE 0.9 % IV SOLN
INTRAVENOUS | Status: DC
  Administered 2017-11-23 – 2017-11-25 (×3): via INTRAVENOUS

## 2017-11-23 MED ORDER — ONDANSETRON 4 MG PO TBDP: 4.0000 mg | ORAL_TABLET | Freq: Four times a day (QID) | ORAL | Status: DC | PRN

## 2017-11-23 MED ORDER — IOPAMIDOL (ISOVUE-300) INJECTION 61%
INTRAVENOUS | Status: AC
  Administered 2017-11-23: 100 mL
  Filled 2017-11-23: qty 100

## 2017-11-23 MED ORDER — ACETAMINOPHEN 325 MG PO TABS: 650.0000 mg | ORAL_TABLET | Freq: Four times a day (QID) | ORAL | Status: DC | PRN

## 2017-11-23 MED ORDER — ENOXAPARIN SODIUM 40 MG/0.4ML ~~LOC~~ SOLN
40.0000 mg | SUBCUTANEOUS | Status: DC
  Administered 2017-11-25: 40 mg via SUBCUTANEOUS
  Filled 2017-11-23: qty 0.4

## 2017-11-23 MED ORDER — SIMETHICONE 80 MG PO CHEW: 40.0000 mg | CHEWABLE_TABLET | Freq: Four times a day (QID) | ORAL | Status: DC | PRN

## 2017-11-23 MED ORDER — MORPHINE SULFATE (PF) 4 MG/ML IV SOLN
2.0000 mg | INTRAVENOUS | Status: DC | PRN
  Administered 2017-11-23 – 2017-11-24 (×2): 2 mg via INTRAVENOUS
  Filled 2017-11-23 (×2): qty 1

## 2017-11-23 MED ORDER — ONDANSETRON HCL 4 MG/2ML IJ SOLN: 4.0000 mg | Freq: Four times a day (QID) | INTRAMUSCULAR | Status: DC | PRN

## 2017-11-23 MED ORDER — INSULIN ASPART 100 UNIT/ML ~~LOC~~ SOLN
0.0000 [IU] | Freq: Every day | SUBCUTANEOUS | Status: DC
  Administered 2017-11-23 – 2017-11-24 (×2): 3 [IU] via SUBCUTANEOUS
  Filled 2017-11-23: qty 1

## 2017-11-23 MED ORDER — ACETAMINOPHEN 650 MG RE SUPP: 650.0000 mg | Freq: Four times a day (QID) | RECTAL | Status: DC | PRN

## 2017-11-23 MED ORDER — MORPHINE SULFATE (PF) 4 MG/ML IV SOLN
4.0000 mg | Freq: Once | INTRAVENOUS | Status: AC
  Administered 2017-11-23: 4 mg via INTRAVENOUS
  Filled 2017-11-23: qty 1

## 2017-11-23 MED ORDER — SODIUM CHLORIDE 0.9 % IV BOLUS (SEPSIS)
1000.0000 mL | Freq: Once | INTRAVENOUS | Status: AC
  Administered 2017-11-23: 1000 mL via INTRAVENOUS

## 2017-11-23 MED ORDER — INSULIN ASPART 100 UNIT/ML ~~LOC~~ SOLN
0.0000 [IU] | Freq: Three times a day (TID) | SUBCUTANEOUS | Status: DC
  Administered 2017-11-24: 3 [IU] via SUBCUTANEOUS
  Administered 2017-11-24 – 2017-11-25 (×3): 5 [IU] via SUBCUTANEOUS

## 2017-11-23 NOTE — ED Notes (Signed)
Care hand off given to [facility]

## 2017-11-23 NOTE — Progress Notes (Signed)
Alexander [name] 11/23/2017 9:01 AM Location: [facility] Patient #: [id number] DOB: 1966/10/17 Married / Language: English / Race: Black or African American Male   History of Present Illness  The patient is a 51 year old male who presents with a subcutaneous abscess. He is presenting with a 3-week history of a persistent left groin abscess. About 3 weeks ago, when he was in [place], [place], he noticed a small lump near his groin which became larger within 3-4 days so he had it drained and was started on 2 different antibiotics. He did not have much improvement, so he went to [facility] ER on 11/14/17 to have it incised and drained again. The ER provider placed him on doxycycline which he has completed. He states prior to 3 weeks ago, he never noticed a lump in this area and this has never happened before.  He is presenting to [facility] for follow-up but states the area has not improved, and instead has become more painful and more red. He states the drainage was decreasing but in the past 2 days it has drained more than it has in the past 3 weeks. He has applied warm compresses without much relief. He denies obvious fevers, but admits to chills. Denies N/V.  He had uncontrolled diabetes in the past, but states he's been taking his medication as prescribed. He reports a fasting blood glucose near 200 last week. Hemoglobin A1c in November 2017 was 11.9. He does not smoke.   Past Surgical History Thyroid Surgery  [facility] / Umbilical Hernia Surgery  Right.  Diagnostic Studies History Colonoscopy  never  Allergies No Known Drug Allergies 11/23/2017 Allergies Reconciled   Medication History Morphine Sulfate (15MG  Tablet, Oral) Active. Acetaminophen-Codeine #3 (300-30MG  Tablet, Oral) Active. Doxycycline Hyclate (100MG  Capsule, Oral) Active. Losartan Potassium (100MG  Tablet, Oral) Active. MetFORMIN HCl (1000MG  Tablet, Oral) Active. Sildenafil Citrate (100MG  Tablet, Oral)  Active. Simvastatin (40MG  Tablet, Oral) Active. Medications Reconciled  Social History  Alcohol use  Occasional alcohol use. Caffeine use  Coffee, Tea. No drug use  Tobacco use  Never smoker.  Family History  Diabetes Mellitus  Father. Heart disease in male family member before age 52  Hypertension  Father.  Other Problems  Diabetes Mellitus  High blood pressure    Review of Systems  General Not Present- Appetite Loss, Chills, Fatigue, Fever, Night Sweats, Weight Gain and Weight Loss. Skin Present- Dryness and New Lesions. Not Present- Change in Wart/Mole, Hives, Jaundice, Non-Healing Wounds, Rash and Ulcer. HEENT Not Present- Earache, Hearing Loss, Hoarseness, Nose Bleed, Oral Ulcers, Ringing in the Ears, Seasonal Allergies, Sinus Pain, Sore Throat, Visual Disturbances, Wears glasses/contact lenses and Yellow Eyes. Respiratory Not Present- Bloody sputum, Chronic Cough, Difficulty Breathing, Snoring and Wheezing. Breast Not Present- Breast Mass, Breast Pain, Nipple Discharge and Skin Changes. Cardiovascular Not Present- Chest Pain, Difficulty Breathing Lying Down, Leg Cramps, Palpitations, Rapid Heart Rate, Shortness of Breath and Swelling of Extremities. Gastrointestinal Not Present- Abdominal Pain, Bloating, Bloody Stool, Change in Bowel Habits, Chronic diarrhea, Constipation, Difficulty Swallowing, Excessive gas, Gets full quickly at meals, Hemorrhoids, Indigestion, Nausea, Rectal Pain and Vomiting. Male Genitourinary Not Present- Blood in Urine, Change in Urinary Stream, Frequency, Impotence, Nocturia, Painful Urination, Urgency and Urine Leakage. Musculoskeletal Not Present- Back Pain, Joint Pain, Joint Stiffness, Muscle Pain, Muscle Weakness and Swelling of Extremities. Neurological Not Present- Decreased Memory, Fainting, Headaches, Numbness, Seizures, Tingling, Tremor, Trouble walking and Weakness. Psychiatric Not Present- Anxiety, Bipolar, Change in Sleep Pattern,  Depression, Fearful and Frequent  crying. Endocrine Not Present- Cold Intolerance, Excessive Hunger, Hair Changes, Heat Intolerance, Hot flashes and New Diabetes. Hematology Not Present- Blood Thinners, Easy Bruising, Excessive bleeding, Gland problems, HIV and Persistent Infections.  Vitals 11/23/2017 9:03 AM Weight: 222 lb Height: 70in Body Surface Area: 2.18 m Body Mass Index: 31.85 kg/m  Temp.: 97.18F  Pulse: 95 (Regular)  BP: 120/90 (Sitting, Left Arm, Standard)   Physical Exam GENERAL: Very pleasant, well-developed, well nourished male in no acute distress  EYES: No scleral icterus Pupils equal, lids normal  EXTERNAL EARS: Intact, no masses or lesions EXTERNAL NOSE: Intact, no masses or lesions MOUTH: Lips - no lesions Dentition - normal for age  RESPIRATORY: Normal effort, no use of accessory muscles  MUSCULOSKELETAL: Normal gait Grossly normal ROM upper extremities Grossly normal ROM lower extremities  SKIN: Warm and dry Not diaphoretic  PSYCHIATRIC: Normal judgement and insight Normal mood and affect Alert, oriented x 3  Integumentary Left groin: Area about 7 cm x 5 cm of induration and erythema with bullous appearance Central fluctuance 3 cm x 2 cm with small opening on medial side of this area with pink/yellow drainage Induration almost extends to perineal area Tender to palpation   Assessment & Plan  ABSCESS OF GROIN, LEFT (T01.601) Given his persistent abscess with reported worsening physical exam findings, I asked Dr. Dema Severin to also evaluate the patient. We believe a CT scan is necessary to better evaluate the abscess, as adequate drainage in the office may not be possible without this imaging. We advised the patient to go to the ER to obtain imaging, basic labs, and evaluation of possible incision and drainage in the OR. He was advised to stay NPO until he is fully evaluated. The [facility] physician and the intake nurse at [facility] were  notified of the plan.  Signed electronically by Kellie Shropshire, PA C (11/23/2017 9:52 AM) [facility]

## 2017-11-23 NOTE — ED Notes (Signed)
Pt ambulated to bathroom 

## 2017-11-23 NOTE — H&P (Signed)
Alexander [name] is an 51 y.o. male.   Chief Complaint: left thigh mass HPI:  51 year old male who presents with a subcutaneous abscess. He is presenting with a 3-week history of a persistent left groin abscess. About 3 weeks ago, when he was in [place], [place], he noticed a small lump near his groin which became larger within 3-4 days so he had it drained and was started on 2 different antibiotics. He did not have much improvement, so he went to [place] [facility] ER on 11/14/17 to have it incised and drained again. The ER provider placed him on doxycycline which he has completed. He states prior to 3 weeks ago, he never noticed a lump in this area and this has never happened before.  He is presented to surgery office for follow-up but states the area has not improved, and instead has become more painful.  It has been as large as it is for about a week He states the drainage was decreasing but in the past 2 days it has drained more than it has in the past 3 weeks. He has applied warm compresses without much relief. He denies fevers Denies N/V.  He had uncontrolled diabetes in the past, but states he's been taking his medication as prescribed. He reports a fasting blood glucose near 200 last week. Hemoglobin A1c in November 2017 was 11.9. He does not smoke.   Past Medical History:  Diagnosis Date  . Diabetes mellitus without complication ([place])   . Hyperlipidemia   . Hypertension     Past Surgical History:  Procedure Laterality Date  . ORIF HIP FRACTURE Right     Family History  Problem Relation Age of Onset  . Diabetes Father   . Heart attack Father 68       Died age 22 MI   Social History:  reports that  has never smoked. he has never used smokeless tobacco. He reports that he drinks alcohol. He reports that he does not use drugs.  Allergies: No Known Allergies   Medication History Morphine Sulfate (15MG Tablet, Oral) Active. Acetaminophen-Codeine #3 (300-30MG Tablet, Oral)  Active. Doxycycline Hyclate (100MG Capsule, Oral) Active. Losartan Potassium (100MG Tablet, Oral) Active. MetFORMIN HCl (1000MG Tablet, Oral) Active. Sildenafil Citrate (100MG Tablet, Oral) Active. Simvastatin (40MG Tablet, Oral) Active. Medications Reconciled   Results for orders placed or performed during the hospital encounter of 11/23/17 (from the past 48 hour(s))  Comprehensive metabolic panel     Status: Abnormal   Collection Time: 11/23/17 12:21 PM  Result Value Ref Range   Sodium 137 135 - 145 mmol/L   Potassium 4.2 3.5 - 5.1 mmol/L   Chloride 104 101 - 111 mmol/L   CO2 24 22 - 32 mmol/L   Glucose, Bld 336 (H) 65 - 99 mg/dL   BUN 10 6 - 20 mg/dL   Creatinine, Ser 0.75 0.61 - 1.24 mg/dL   Calcium 8.7 (L) 8.9 - 10.3 mg/dL   Total Protein 6.7 6.5 - 8.1 g/dL   Albumin 3.1 (L) 3.5 - 5.0 g/dL   AST 18 15 - 41 U/L   ALT 13 (L) 17 - 63 U/L   Alkaline Phosphatase 61 38 - 126 U/L   Total Bilirubin 0.7 0.3 - 1.2 mg/dL   GFR calc non Af Amer >60 >60 mL/min   GFR calc Af Amer >60 >60 mL/min    Comment: (NOTE) The eGFR has been calculated using the CKD EPI equation. This calculation has not been validated in all  clinical situations. eGFR's persistently <60 mL/min signify possible Chronic Kidney Disease.    Anion gap 9 5 - 15  CBC with Differential     Status: Abnormal   Collection Time: 11/23/17 12:21 PM  Result Value Ref Range   WBC 6.2 4.0 - 10.5 K/uL   RBC 4.13 (L) 4.22 - 5.81 MIL/uL   Hemoglobin 12.2 (L) 13.0 - 17.0 g/dL   HCT 36.1 (L) 39.0 - 52.0 %   MCV 87.4 78.0 - 100.0 fL   MCH 29.5 26.0 - 34.0 pg   MCHC 33.8 30.0 - 36.0 g/dL   RDW 12.9 11.5 - 15.5 %   Platelets 265 150 - 400 K/uL   Neutrophils Relative % 46 %   Neutro Abs 2.9 1.7 - 7.7 K/uL   Lymphocytes Relative 43 %   Lymphs Abs 2.7 0.7 - 4.0 K/uL   Monocytes Relative 6 %   Monocytes Absolute 0.4 0.1 - 1.0 K/uL   Eosinophils Relative 4 %   Eosinophils Absolute 0.2 0.0 - 0.7 K/uL   Basophils Relative  1 %   Basophils Absolute 0.0 0.0 - 0.1 K/uL   Ct Pelvis W Contrast  Result Date: 11/23/2017 CLINICAL DATA:  Left inguinal abscess EXAM: CT PELVIS WITH CONTRAST TECHNIQUE: Multidetector CT imaging of the pelvis was performed using the standard protocol following the bolus administration of intravenous contrast. CONTRAST:  173m ISOVUE-300 IOPAMIDOL (ISOVUE-300) INJECTION 61% COMPARISON:  None. FINDINGS: Urinary Tract: Bladder is well distended. Visualized kidneys are within normal limits. No obstructive changes are seen. Bowel: No inflammatory changes of the bowel are seen although fecal material is noted throughout the visualized colon. This may be related to a degree of constipation. No definitive obstructive changes are seen. Vascular/Lymphatic: Vascular structures appear within normal limits. The left inguinal lymph nodes are mildly prominent likely of reactive nature. Reproductive: Prostate appears within normal limits. No focal abnormality is noted. Other: In the medial aspect of the left proximal thigh there is an irregular fluid collection with peripheral enhancement and surrounding edema which measures approximately 6.5 x 1.9 cm in greatest dimension. A few small foci of air are noted within consistent with the given clinical history of abscess. This appears to extend to the skin surface. No other focal abscess is noted. The collection terminates just below the inguinal crease. Musculoskeletal: Postsurgical changes are noted in the proximal right femur. No other significant bony abnormality is seen. IMPRESSION: Subcutaneous abscess consistent with the given clinical history in the medial aspect of the proximal left thigh as described. It appears to extend to the skin surface. Diffuse foci of air are noted within. Electronically Signed   By: MInez CatalinaM.D.   On: 11/23/2017 16:39      Review of Systems  General Not Present- Appetite Loss, Chills, Fatigue, Fever, Night Sweats, Weight Gain and  Weight Loss. Skin Present- Dryness and New Lesions. Not Present- Change in Wart/Mole, Hives, Jaundice, Non-Healing Wounds, Rash and Ulcer. HEENT Not Present- Earache, Hearing Loss, Hoarseness, Nose Bleed, Oral Ulcers, Ringing in the Ears, Seasonal Allergies, Sinus Pain, Sore Throat, Visual Disturbances, Wears glasses/contact lenses and Yellow Eyes. Respiratory Not Present- Bloody sputum, Chronic Cough, Difficulty Breathing, Snoring and Wheezing. Breast Not Present- Breast Mass, Breast Pain, Nipple Discharge and Skin Changes. Cardiovascular Not Present- Chest Pain, Difficulty Breathing Lying Down, Leg Cramps, Palpitations, Rapid Heart Rate, Shortness of Breath and Swelling of Extremities. Gastrointestinal Not Present- Abdominal Pain, Bloating, Bloody Stool, Change in Bowel Habits, Chronic diarrhea, Constipation, Difficulty Swallowing, Excessive  gas, Gets full quickly at meals, Hemorrhoids, Indigestion, Nausea, Rectal Pain and Vomiting. Male Genitourinary Not Present- Blood in Urine, Change in Urinary Stream, Frequency, Impotence, Nocturia, Painful Urination, Urgency and Urine Leakage. Musculoskeletal Not Present- Back Pain, Joint Pain, Joint Stiffness, Muscle Pain, Muscle Weakness and Swelling of Extremities. Neurological Not Present- Decreased Memory, Fainting, Headaches, Numbness, Seizures, Tingling, Tremor, Trouble walking and Weakness. Psychiatric Not Present- Anxiety, Bipolar, Change in Sleep Pattern, Depression, Fearful and Frequent crying. Endocrine Not Present- Cold Intolerance, Excessive Hunger, Hair Changes, Heat Intolerance, Hot flashes and New Diabetes. Hematology Not Present- Blood Thinners, Easy Bruising, Excessive bleeding, Gland problems, HIV and Persistent Infections.  Blood pressure 135/82, pulse 87, temperature 97.8 F (36.6 C), temperature source Oral, resp. rate 16, height 5' 10"  (1.778 m), weight 100.7 kg (222 lb), SpO2 99 %. Physical Exam GENERAL: Very pleasant,  well-developed, well nourished male in no acute distress EYES: No scleral icterus Pupils equal, lids normal EXTERNAL EARS: Intact, no masses or lesions EXTERNAL NOSE: Intact, no masses or lesions MOUTH: Lips - no lesions Dentition - normal for age RESPIRATORY: Normal effort, no use of accessory muscles MUSCULOSKELETAL: Normal gait Grossly normal ROM upper extremities Grossly normal ROM lower extremities SKIN: Warm and dry Not diaphoretic Integumentary Left groin: Area about 7 cm x 5 cm of induration and erythema with bullous appearance Central fluctuance 3 cm x 2 cm with small opening on medial side of this area with pink/yellow drainage Induration almost extends to perineal area, there is no cellulitis   Assessment/Plan ABSCESS OF GROIN, LEFT (L02.214) Nothing really concerning on ct. He just has mass on left thigh that looks like has been present for some time.  This doesn't really look like a necrotizing soft tissue infections.  I think it does need to be incised and adequately drained. It is not emergent. His wbc is normal, he is afebrile and this has really been unchanged for about a week for the most part. Will admit him, make npo after midnight, start antibiotics and then take to or in the am.   Rolm Bookbinder, MD 11/23/2017, 6:40 PM

## 2017-11-23 NOTE — ED Triage Notes (Signed)
Pt to ER sent from [facility] for further evaluation and possible treatment of groin abscess that may require OR I&D. Pt is a/o x4. NAD. Needs lab work per [facility].

## 2017-11-23 NOTE — ED Notes (Signed)
Pt ambulated down hallway several times with ease. Is now resting in bed and content. Informed tech that wife may or may not show up.

## 2017-11-23 NOTE — ED Provider Notes (Signed)
[facility] EMERGENCY DEPARTMENT Provider Note   CSN: [id number] Arrival date & time: 11/23/17  0160     History   Chief Complaint Chief Complaint  Patient presents with  . Abscess    HPI Andrea Colglazier is a 51 y.o. male.  HPI Patient presents to the emergency room for complex left inguinal abscess.  Patient has history of an inguinal abscess that started several weeks ago.  He was initially evaluated in [place] and had an I&D procedure.  Patient was started on 2 different antibiotics following the procedure.  He had recurrence of the abscess.  He was seen in the emergency room here at [facility] on November 24.  Patient had a repeat I&D procedure performed and was referred to general surgery.  Since that time the abscess has recurred.  Patient has recurrent large amount of swelling in the lower left inguinal region.  He went to see the general surgeon today.  They recommended he come to the emergency room for a CT scan and laboratory testing.  They feel he will likely need to go to the OR.  Patient denies any fevers or chills.  No other complaints Past Medical History:  Diagnosis Date  . Diabetes mellitus without complication ([place])   . Hyperlipidemia   . Hypertension     Patient Active Problem List   Diagnosis Date Noted  . Abnormal finding on MRI of brain 04/21/2014  . Impotence of organic origin 09/24/2013  . HTN (hypertension) 12/17/2012  . Diabetes mellitus, type 2 ([place]) 12/17/2012  . Hyperlipidemia 12/17/2012    Past Surgical History:  Procedure Laterality Date  . ORIF HIP FRACTURE Right        Home Medications    Prior to Admission medications   Medication Sig Start Date End Date Taking? Authorizing Provider  aspirin 81 MG tablet Take 81 mg by mouth every other day.   Yes [provider]  clotrimazole (LOTRIMIN) 1 % cream Apply 1 application topically 2 (two) times daily. 10/21/16  Yes McVey, Gelene Mink, PA-C  glipiZIDE  (GLUCOTROL) 10 MG tablet TAKE 2 TABLETS BY MOUTH 2  TIMES DAILY BEFORE A MEAL 02/24/17  Yes Scot Jun, FNP  losartan (COZAAR) 100 MG tablet TAKE 1 TABLET BY MOUTH  DAILY 10/21/17  Yes Weber, Sarah L, PA-C  metFORMIN (GLUCOPHAGE) 1000 MG tablet TAKE 1 TABLET BY MOUTH 2  TIMES DAILY WITH A MEAL 04/11/16  Yes Posey Boyer, MD  Multiple Vitamin (MULTIVITAMIN) tablet Take 1 tablet by mouth daily.   Yes [provider]  simvastatin (ZOCOR) 40 MG tablet Take 1 tablet by mouth  daily  Need appointment for  refills 03/14/16  Yes Posey Boyer, MD  doxycycline (VIBRAMYCIN) 100 MG capsule Take 1 capsule (100 mg total) by mouth 2 (two) times daily. One po bid x 7 days 11/14/17   Deno Etienne, DO  morphine (MSIR) 15 MG tablet Take 1 tablet (15 mg total) by mouth every 4 (four) hours as needed for severe pain. Patient not taking: Reported on 11/23/2017 11/14/17   Deno Etienne, DO  pioglitazone (ACTOS) 15 MG tablet Take 1 tablet (15 mg total) by mouth daily. Patient not taking: Reported on 11/23/2017 03/08/16   Posey Boyer, MD  sildenafil (VIAGRA) 100 MG tablet TAKE 1 TABLET BY MOUTH AS NEEDED FOR ERECTILE DYSFUNCTION 09/07/17   McVey, Gelene Mink, PA-C  simvastatin (ZOCOR) 40 MG tablet TAKE 1 TABLET BY MOUTH  DAILY Patient not taking:  Reported on 11/23/2017 10/21/17   Mancel Bale, PA-C    Family History Family History  Problem Relation Age of Onset  . Diabetes Father   . Heart attack Father 13       Died age 16 MI    Social History Social History   Tobacco Use  . Smoking status: Never Smoker  . Smokeless tobacco: Never Used  Substance Use Topics  . Alcohol use: Yes    Alcohol/week: 0.0 oz    Comment: social   . Drug use: No     Allergies   Patient has no known allergies.   Review of Systems Review of Systems  All other systems reviewed and are negative.    Physical Exam Updated Vital Signs BP 135/82 (BP Location: Right Arm)   Pulse 87   Temp 97.8 F (36.6  C) (Oral)   Resp 16   Ht 1.778 m (5\' 10" )   Wt 100.7 kg (222 lb)   SpO2 99%   BMI 31.85 kg/m   Physical Exam  Constitutional: He appears well-developed and well-nourished. No distress.  HENT:  Head: Normocephalic and atraumatic.  Right Ear: External ear normal.  Left Ear: External ear normal.  Eyes: Conjunctivae are normal. Right eye exhibits no discharge. Left eye exhibits no discharge. No scleral icterus.  Neck: Neck supple. No tracheal deviation present.  Cardiovascular: Normal rate, regular rhythm and intact distal pulses.  Pulmonary/Chest: Effort normal and breath sounds normal. No stridor. No respiratory distress. He has no wheezes. He has no rales.  Abdominal: Soft. Bowel sounds are normal. He exhibits no distension. There is no tenderness. There is no rebound and no guarding.  Genitourinary:  Genitourinary Comments: Status post I&D in the left inguinal region, large area of swelling and induration with surrounding erythema in the left inguinal region consistent with a large abscess  Musculoskeletal: He exhibits no edema or tenderness.  Neurological: He is alert. He has normal strength. No cranial nerve deficit (no facial droop, extraocular movements intact, no slurred speech) or sensory deficit. He exhibits normal muscle tone. He displays no seizure activity. Coordination normal.  Skin: Skin is warm and dry. No rash noted.  Psychiatric: He has a normal mood and affect.  Nursing note and vitals reviewed.    ED Treatments / Results  Labs (all labs ordered are listed, but only abnormal results are displayed) Labs Reviewed  COMPREHENSIVE METABOLIC PANEL - Abnormal; Notable for the following components:      Result Value   Glucose, Bld 336 (*)    Calcium 8.7 (*)    Albumin 3.1 (*)    ALT 13 (*)    All other components within normal limits  CBC WITH DIFFERENTIAL/PLATELET - Abnormal; Notable for the following components:   RBC 4.13 (*)    Hemoglobin 12.2 (*)    HCT 36.1  (*)    All other components within normal limits    Radiology Ct Pelvis W Contrast  Result Date: 11/23/2017 CLINICAL DATA:  Left inguinal abscess EXAM: CT PELVIS WITH CONTRAST TECHNIQUE: Multidetector CT imaging of the pelvis was performed using the standard protocol following the bolus administration of intravenous contrast. CONTRAST:  110mL ISOVUE-300 IOPAMIDOL (ISOVUE-300) INJECTION 61% COMPARISON:  None. FINDINGS: Urinary Tract: Bladder is well distended. Visualized kidneys are within normal limits. No obstructive changes are seen. Bowel: No inflammatory changes of the bowel are seen although fecal material is noted throughout the visualized colon. This may be related to a degree of constipation. No definitive  obstructive changes are seen. Vascular/Lymphatic: Vascular structures appear within normal limits. The left inguinal lymph nodes are mildly prominent likely of reactive nature. Reproductive: Prostate appears within normal limits. No focal abnormality is noted. Other: In the medial aspect of the left proximal thigh there is an irregular fluid collection with peripheral enhancement and surrounding edema which measures approximately 6.5 x 1.9 cm in greatest dimension. A few small foci of air are noted within consistent with the given clinical history of abscess. This appears to extend to the skin surface. No other focal abscess is noted. The collection terminates just below the inguinal crease. Musculoskeletal: Postsurgical changes are noted in the proximal right femur. No other significant bony abnormality is seen. IMPRESSION: Subcutaneous abscess consistent with the given clinical history in the medial aspect of the proximal left thigh as described. It appears to extend to the skin surface. Diffuse foci of air are noted within. Electronically Signed   By: Inez Catalina M.D.   On: 11/23/2017 16:39    Procedures Procedures (including critical care time)  Medications Ordered in ED Medications    sodium chloride 0.9 % bolus 1,000 mL (not administered)  iopamidol (ISOVUE-300) 61 % injection (100 mLs  Contrast Given 11/23/17 1617)     Initial Impression / Assessment and Plan / ED Course  I have reviewed the triage vital signs and the nursing notes.  Pertinent labs & imaging results that were available during my care of the patient were reviewed by me and considered in my medical decision making (see chart for details).  Clinical Course as of Nov 23 1754  Mon Nov 23, 2017  1754 Discussed findings with Dr Donne Hazel.  He will evaluate the patient in the ED.  [JK]    Clinical Course User Index [JK] Dorie Rank, MD  Patient CT scan confirms the location and extent of his inguinal abscess.  He remains hemodynamically stable does not appear septic.  I will consult with general surgery  Final Clinical Impressions(s) / ED Diagnoses   Final diagnoses:  Abscess     Dorie Rank, MD 11/23/17 1755

## 2017-11-23 NOTE — ED Notes (Signed)
Pt ready to be transported.

## 2017-11-23 NOTE — ED Notes (Signed)
CBG taken at 21:55 was 288.

## 2017-11-23 NOTE — ED Notes (Signed)
Family at bedside. 

## 2017-11-23 NOTE — ED Notes (Signed)
No answer when called for triage at 11:35

## 2017-11-23 NOTE — ED Notes (Signed)
Sent from dr's office [facility] ) for CT scan of pelvis for possible abscess in left groin area.

## 2017-11-24 ENCOUNTER — Inpatient Hospital Stay (HOSPITAL_COMMUNITY): Payer: 59 | Admitting: Anesthesiology

## 2017-11-24 ENCOUNTER — Encounter (HOSPITAL_COMMUNITY): Admission: EM | Disposition: A | Discharge status: Home / Self Care | Payer: Self-pay | Source: Home / Self Care

## 2017-11-24 ENCOUNTER — Other Ambulatory Visit: Payer: Self-pay

## 2017-11-24 ENCOUNTER — Encounter (HOSPITAL_COMMUNITY): Payer: Self-pay | Admitting: Surgery

## 2017-11-24 HISTORY — PX: I & D EXTREMITY: SHX5045

## 2017-11-24 LAB — GLUCOSE, CAPILLARY
GLUCOSE-CAPILLARY: 192 mg/dL — AB (ref 65–99)
GLUCOSE-CAPILLARY: 270 mg/dL — AB (ref 65–99)
Glucose-Capillary: 241 mg/dL — ABNORMAL HIGH (ref 65–99)
Glucose-Capillary: 158 mg/dL — ABNORMAL HIGH (ref 65–99)
Glucose-Capillary: 192 mg/dL — ABNORMAL HIGH (ref 65–99)
Glucose-Capillary: 151 mg/dL — ABNORMAL HIGH (ref 65–99)

## 2017-11-24 LAB — SURGICAL PCR SCREEN
MRSA, PCR: NEGATIVE
Staphylococcus aureus: NEGATIVE

## 2017-11-24 SURGERY — IRRIGATION AND DEBRIDEMENT EXTREMITY
Anesthesia: General | Site: Thigh | Laterality: Left

## 2017-11-24 MED ORDER — LIDOCAINE HCL (CARDIAC) 20 MG/ML IV SOLN
INTRAVENOUS | Status: DC | PRN
  Administered 2017-11-24: 80 mg via INTRAVENOUS

## 2017-11-24 MED ORDER — FENTANYL CITRATE (PF) 100 MCG/2ML IJ SOLN
INTRAMUSCULAR | Status: DC | PRN
  Administered 2017-11-24 (×3): 50 ug via INTRAVENOUS

## 2017-11-24 MED ORDER — ROCURONIUM BROMIDE 10 MG/ML (PF) SYRINGE
PREFILLED_SYRINGE | INTRAVENOUS | Status: AC
  Filled 2017-11-24: qty 5

## 2017-11-24 MED ORDER — FENTANYL CITRATE (PF) 250 MCG/5ML IJ SOLN
INTRAMUSCULAR | Status: AC
  Filled 2017-11-24: qty 5

## 2017-11-24 MED ORDER — OXYCODONE-ACETAMINOPHEN 5-325 MG PO TABS
1.0000 | ORAL_TABLET | ORAL | Status: DC | PRN
  Administered 2017-11-24 – 2017-11-25 (×4): 1 via ORAL
  Filled 2017-11-24 (×4): qty 1

## 2017-11-24 MED ORDER — SUCCINYLCHOLINE CHLORIDE 200 MG/10ML IV SOSY
PREFILLED_SYRINGE | INTRAVENOUS | Status: AC
  Filled 2017-11-24: qty 10

## 2017-11-24 MED ORDER — ONDANSETRON HCL 4 MG/2ML IJ SOLN
INTRAMUSCULAR | Status: DC | PRN
  Administered 2017-11-24: 4 mg via INTRAVENOUS

## 2017-11-24 MED ORDER — 0.9 % SODIUM CHLORIDE (POUR BTL) OPTIME
TOPICAL | Status: DC | PRN
  Administered 2017-11-24: 1000 mL

## 2017-11-24 MED ORDER — BUPIVACAINE-EPINEPHRINE (PF) 0.25% -1:200000 IJ SOLN
INTRAMUSCULAR | Status: AC
  Filled 2017-11-24: qty 30

## 2017-11-24 MED ORDER — LACTATED RINGERS IV SOLN
INTRAVENOUS | Status: DC
  Administered 2017-11-24: 12:00:00 via INTRAVENOUS

## 2017-11-24 MED ORDER — ONDANSETRON HCL 4 MG/2ML IJ SOLN
INTRAMUSCULAR | Status: AC
  Filled 2017-11-24: qty 2

## 2017-11-24 MED ORDER — CEFAZOLIN SODIUM-DEXTROSE 2-3 GM-%(50ML) IV SOLR
INTRAVENOUS | Status: DC | PRN
  Administered 2017-11-24: 2 g via INTRAVENOUS

## 2017-11-24 MED ORDER — PROPOFOL 10 MG/ML IV BOLUS
INTRAVENOUS | Status: DC | PRN
Start: 2017-11-24 — End: 2017-11-24
  Administered 2017-11-24: 220 mg via INTRAVENOUS

## 2017-11-24 MED ORDER — PROPOFOL 10 MG/ML IV BOLUS
INTRAVENOUS | Status: AC
  Filled 2017-11-24: qty 20

## 2017-11-24 MED ORDER — SUGAMMADEX SODIUM 200 MG/2ML IV SOLN
INTRAVENOUS | Status: AC
  Filled 2017-11-24: qty 2

## 2017-11-24 MED ORDER — LIDOCAINE 2% (20 MG/ML) 5 ML SYRINGE
INTRAMUSCULAR | Status: AC
  Filled 2017-11-24: qty 5

## 2017-11-24 MED ORDER — MIDAZOLAM HCL 2 MG/2ML IJ SOLN
INTRAMUSCULAR | Status: AC
  Filled 2017-11-24: qty 2

## 2017-11-24 MED ORDER — DEXAMETHASONE SODIUM PHOSPHATE 10 MG/ML IJ SOLN
INTRAMUSCULAR | Status: AC
  Filled 2017-11-24: qty 1

## 2017-11-24 MED ORDER — PHENYLEPHRINE 40 MCG/ML (10ML) SYRINGE FOR IV PUSH (FOR BLOOD PRESSURE SUPPORT)
PREFILLED_SYRINGE | INTRAVENOUS | Status: AC
  Filled 2017-11-24: qty 10

## 2017-11-24 MED ORDER — BUPIVACAINE-EPINEPHRINE 0.25% -1:200000 IJ SOLN
INTRAMUSCULAR | Status: DC | PRN
  Administered 2017-11-24: 1 mL

## 2017-11-24 MED ORDER — EPHEDRINE 5 MG/ML INJ
INTRAVENOUS | Status: AC
  Filled 2017-11-24: qty 10

## 2017-11-24 SURGICAL SUPPLY — 39 items
BNDG GAUZE ELAST 4 BULKY (GAUZE/BANDAGES/DRESSINGS) ×1 IMPLANT
CANISTER SUCT 3000ML PPV (MISCELLANEOUS) ×2 IMPLANT
CHLORAPREP W/TINT 26ML (MISCELLANEOUS) IMPLANT
COVER SURGICAL LIGHT HANDLE (MISCELLANEOUS) ×2 IMPLANT
DRAPE HALF SHEET 40X57 (DRAPES) ×2 IMPLANT
DRAPE UTILITY XL STRL (DRAPES) ×2 IMPLANT
ELECT CAUTERY BLADE 6.4 (BLADE) ×1 IMPLANT
ELECT REM PT RETURN 9FT ADLT (ELECTROSURGICAL) ×2
ELECTRODE REM PT RTRN 9FT ADLT (ELECTROSURGICAL) ×1 IMPLANT
GAUZE SPONGE 4X4 12PLY STRL (GAUZE/BANDAGES/DRESSINGS) ×1 IMPLANT
GLOVE BIO SURGEON STRL SZ8 (GLOVE) ×2 IMPLANT
GLOVE BIOGEL PI IND STRL 6 (GLOVE) IMPLANT
GLOVE BIOGEL PI IND STRL 7.5 (GLOVE) IMPLANT
GLOVE BIOGEL PI IND STRL 8 (GLOVE) ×1 IMPLANT
GLOVE BIOGEL PI INDICATOR 6 (GLOVE) ×1
GLOVE BIOGEL PI INDICATOR 7.5 (GLOVE) ×1
GLOVE BIOGEL PI INDICATOR 8 (GLOVE) ×1
GLOVE ECLIPSE 7.5 STRL STRAW (GLOVE) ×1 IMPLANT
GOWN STRL REUS W/ TWL LRG LVL3 (GOWN DISPOSABLE) ×2 IMPLANT
GOWN STRL REUS W/ TWL XL LVL3 (GOWN DISPOSABLE) ×1 IMPLANT
GOWN STRL REUS W/TWL LRG LVL3 (GOWN DISPOSABLE) ×2
GOWN STRL REUS W/TWL XL LVL3 (GOWN DISPOSABLE) ×2
KIT BASIN OR (CUSTOM PROCEDURE TRAY) ×2 IMPLANT
KIT ROOM TURNOVER OR (KITS) ×2 IMPLANT
NDL HYPO 25GX1X1/2 BEV (NEEDLE) IMPLANT
NEEDLE HYPO 25GX1X1/2 BEV (NEEDLE) ×2 IMPLANT
NS IRRIG 1000ML POUR BTL (IV SOLUTION) ×2 IMPLANT
PACK GENERAL/GYN (CUSTOM PROCEDURE TRAY) ×2 IMPLANT
PAD ABD 8X10 STRL (GAUZE/BANDAGES/DRESSINGS) ×1 IMPLANT
PAD ARMBOARD 7.5X6 YLW CONV (MISCELLANEOUS) ×3 IMPLANT
STOCKINETTE IMPERVIOUS 9X36 MD (GAUZE/BANDAGES/DRESSINGS) ×1 IMPLANT
SWAB COLLECTION DEVICE MRSA (MISCELLANEOUS) ×1 IMPLANT
SWAB CULTURE ESWAB REG 1ML (MISCELLANEOUS) ×1 IMPLANT
SYR CONTROL 10ML LL (SYRINGE) ×1 IMPLANT
TAPE CLOTH SURG 4X10 WHT LF (GAUZE/BANDAGES/DRESSINGS) ×1 IMPLANT
TOWEL OR 17X24 6PK STRL BLUE (TOWEL DISPOSABLE) ×2 IMPLANT
TOWEL OR 17X26 10 PK STRL BLUE (TOWEL DISPOSABLE) ×1 IMPLANT
UNDERPAD 30X30 (UNDERPADS AND DIAPERS) ×1 IMPLANT
WATER STERILE IRR 1000ML POUR (IV SOLUTION) IMPLANT

## 2017-11-24 NOTE — Anesthesia Procedure Notes (Signed)
Procedure Name: LMA Insertion Date/Time: 11/24/2017 2:45 PM Performed by: Shirlyn Goltz, CRNA Pre-anesthesia Checklist: Patient identified, Emergency Drugs available, Suction available and Patient being monitored Patient Re-evaluated:Patient Re-evaluated prior to induction Oxygen Delivery Method: Circle system utilized Preoxygenation: Pre-oxygenation with 100% oxygen Induction Type: IV induction LMA: LMA flexible inserted LMA Size: 5.0 Placement Confirmation: positive ETCO2 and breath sounds checked- equal and bilateral Tube secured with: Tape Dental Injury: Teeth and Oropharynx as per pre-operative assessment

## 2017-11-24 NOTE — Progress Notes (Addendum)
Inpatient Diabetes Program Recommendations  AACE/ADA: New Consensus Statement on Inpatient Glycemic Control (2015)  Target Ranges:  Prepandial:   less than 140 mg/dL      Peak postprandial:   less than 180 mg/dL (1-2 hours)      Critically ill patients:  140 - 180 mg/dL   Lab Results  Component Value Date   GLUCAP 192 (H) 11/24/2017   HGBA1C 12.1 (H) 11/23/2017    Review of Glycemic Control Results for Alexander [name], Alexander [name] (MRN [id number]) as of 11/24/2017 15:18  Ref. Range 11/23/2017 21:55 11/24/2017 07:34 11/24/2017 11:13 11/24/2017 14:35  Glucose-Capillary Latest Ref Range: 65 - 99 mg/dL 288 (H) 241 (H) 192 (H) 192 (H)   Diabetes history: DM2 Outpatient Diabetes medications: Actos 15 mg qd + Metformion 1 gm bid Current orders for Inpatient glycemic control: Novolog moderate correction tid + hs  Inpatient Diabetes Program Recommendations:    Patient currently in OR. Noted A1c has been elevated each time since 2016. Current A1c is 12.1. Probably need insulin on D/C home. Please consider: -Lantus 20 units daily -May need Novolog meal coverage when eating postop. Will plan to speak with patient post-op.  Thank you, Nani Gasser. Chenay Nesmith, RN, MSN, CDE  Diabetes Coordinator Inpatient Glycemic Control Team Team Pager [phone] (8am-5pm) 11/24/2017 3:22 PM

## 2017-11-24 NOTE — Anesthesia Postprocedure Evaluation (Signed)
Anesthesia Post Note  Patient: Alexander [name]  Procedure(s) Performed: IRRIGATION AND DEBRIDEMENT LEFT THIGH ABSCESS (Left Thigh)     Patient location during evaluation: PACU Anesthesia Type: General Level of consciousness: awake Pain management: pain level controlled Vital Signs Assessment: post-procedure vital signs reviewed and stable Respiratory status: spontaneous breathing Cardiovascular status: stable Anesthetic complications: no    Last Vitals:  Vitals:   11/24/17 1615 11/24/17 1631  BP: (!) 133/99 (!) 143/100  Pulse: 77 72  Resp: 15 18  Temp: (!) 36.1 C 36.9 C  SpO2: 97% 96%    Last Pain:  Vitals:   11/24/17 1743  TempSrc:   PainSc: 2                  Kaiser Belluomini

## 2017-11-24 NOTE — Op Note (Signed)
Preoperative diagnosis: Left medial thigh complex abscess  Postoperative diagnosis: Same  Procedure:  Incision and drainage left medial thigh abscess  Surgeon: Marcello Moores M.D.  Anesthesia: LMA with local  EBL: 20 mL  Specimens: Cultures to pathology  Drains: None  Indications for procedure: The patient presents for drainage of a complex left medial thigh abscess. He treated medically without resolution. Risks, benefits and options were discussed with the patient.The procedure has been discussed with the patient.  Alternative therapies have been discussed with the patient.  Operative risks include bleeding,  Infection,  Organ injury,  Nerve injury,  Blood vessel injury,  DVT,  Pulmonary embolism,  Death,  And possible reoperation.  Medical management risks include worsening of present situation.  The success of the procedure is 50 -90 % at treating patients symptoms.  The patient understands and agrees to proceed.   Description of procedure: The patient was met in the holding area.  Left thigh was examined and marked as the correct side.  Questions were answered.Description of procedure: The patient was met in the holding area. Left thigh was examined and marked facet correct side. Questions are answered.  He was taken back to the operating room and placed supine on the OR table.  After induction of general anesthesia, left medial thigh was prepped and draped in sterile fashion.  Timeout was done.  He was on preoperative antibiotics. He is taken back Room and placed upon the OR table. After induction of general anesthesia, left medial thigh was prepped and draped in sterile fashion. Timeout was done.  The abscess cavity was encountered and opened the left medial thigh.  It was complex and tracks He was on preoperative antibiotics. The abscess cavity was encountered at the left medial thigh. This complex and tracks medially. Medially. This was opened mild and irrigated out. H  This was opened mild and  irrigated out.  There are multipleter iple loculations.  Loculations.hemostasis achieved with cautery.  Wound packed with saline soaked Kerlix.  Dry dressing applied.  All final counts found to be correct.   Hemostasis achieved cautery. Wound packed with saline soaked Kerlix.  Dressing applied. All counts correct.Local anesthetic was infiltrated consisting of 0.25% Sensorcaine with epinephrine.all final counts were found to be correct.  The patient was awoken taken recovery extubated in satisfactory condition.   All final counts are found to be correct. The patient was awoken taken recovery extubated in satisfactory condition.

## 2017-11-24 NOTE — Transfer of Care (Signed)
Immediate Anesthesia Transfer of Care Note  Patient: Alexander [name]  Procedure(s) Performed: IRRIGATION AND DEBRIDEMENT LEFT THIGH ABSCESS (Left Thigh)  Patient Location: PACU  Anesthesia Type:General  Level of Consciousness: awake, alert , oriented and patient cooperative  Airway & Oxygen Therapy: Patient Spontanous Breathing and Patient connected to face mask oxygen  Post-op Assessment: Report given to RN and Post -op Vital signs reviewed and stable  Post vital signs: Reviewed and stable  Last Vitals:  Vitals:   11/24/17 0457 11/24/17 1527  BP: (!) 138/93   Pulse: 69 73  Resp: 17   Temp: 36.7 C (!) 36.3 C  SpO2: 100% 100%    Last Pain:  Vitals:   11/24/17 0814  TempSrc:   PainSc: 0-No pain         Complications: No apparent anesthesia complications

## 2017-11-24 NOTE — Anesthesia Preprocedure Evaluation (Addendum)
Anesthesia Evaluation  Patient identified by MRN, date of birth, ID band Patient awake    Reviewed: Allergy & Precautions, NPO status   Airway Mallampati: II   Neck ROM: Full    Dental no notable dental hx.    Pulmonary neg pulmonary ROS,    breath sounds clear to auscultation       Cardiovascular hypertension,  Rhythm:Regular Rate:Normal     Neuro/Psych negative neurological ROS     GI/Hepatic negative GI ROS, Neg liver ROS,   Endo/Other  diabetes, Poorly ControlledMorbid obesity  Renal/GU negative Renal ROS     Musculoskeletal   Abdominal (+) + obese,   Peds  Hematology   Anesthesia Other Findings   Reproductive/Obstetrics                             Anesthesia Physical Anesthesia Plan  ASA: III  Anesthesia Plan: General   Post-op Pain Management:    Induction: Intravenous  PONV Risk Score and Plan: 3 and Treatment may vary due to age or medical condition, Ondansetron and Dexamethasone  Airway Management Planned: LMA  Additional Equipment:   Intra-op Plan:   Post-operative Plan: Extubation in OR  Informed Consent: I have reviewed the patients History and Physical, chart, labs and discussed the procedure including the risks, benefits and alternatives for the proposed anesthesia with the patient or authorized representative who has indicated his/her understanding and acceptance.   Dental advisory given  Plan Discussed with: CRNA and Anesthesiologist  Anesthesia Plan Comments:        Anesthesia Quick Evaluation

## 2017-11-24 NOTE — Interval H&P Note (Signed)
History and Physical Interval Note:  11/24/2017 12:36 PM  Langdon Crosson  has presented today for surgery, with the diagnosis of LEFT THIGH ABSCESS  The various methods of treatment have been discussed with the patient and family. After consideration of risks, benefits and other options for treatment, the patient has consented to  Procedure(s): IRRIGATION AND DEBRIDEMENT LEFT THIGH ABSCESS (Left) as a surgical intervention .  The patient's history has been reviewed, patient examined, no change in status, stable for surgery.  I have reviewed the patient's chart and labs.  Questions were answered to the patient's satisfaction.    Pt seen examined and agree Needs I/D left groin abscess The procedure has been discussed with the patient.  Alternative therapies have been discussed with the patient.  Operative risks include bleeding,  Infection,  Organ injury,  Nerve injury,  Blood vessel injury,  DVT,  Pulmonary embolism,  Death,  And possible reoperation.  Medical management risks include worsening of present situation.  The success of the procedure is 50 -90 % at treating patients symptoms.  The patient understands and agrees to proceed.   [place]

## 2017-11-25 ENCOUNTER — Encounter (HOSPITAL_COMMUNITY): Payer: Self-pay | Admitting: Surgery

## 2017-11-25 LAB — GLUCOSE, CAPILLARY
Glucose-Capillary: 231 mg/dL — ABNORMAL HIGH (ref 65–99)
Glucose-Capillary: 242 mg/dL — ABNORMAL HIGH (ref 65–99)

## 2017-11-25 MED ORDER — CLINDAMYCIN HCL 150 MG PO CAPS
150.0000 mg | ORAL_CAPSULE | Freq: Four times a day (QID) | ORAL | 0 refills | Status: DC
Start: 2017-11-25 — End: 2018-05-28

## 2017-11-25 MED ORDER — CONTINUOUS BLOOD GLUC SENSOR MISC: 1.0000 | 0 refills | Status: DC

## 2017-11-25 MED ORDER — PIOGLITAZONE HCL 15 MG PO TABS: 15.0000 mg | ORAL_TABLET | Freq: Every day | ORAL | 0 refills | Status: DC

## 2017-11-25 MED ORDER — OXYCODONE-ACETAMINOPHEN 5-325 MG PO TABS: 1.0000 | ORAL_TABLET | ORAL | 0 refills | Status: DC | PRN

## 2017-11-25 NOTE — Progress Notes (Signed)
Pt given discharge instructions along with AVS and discharge script before leaving unit,condition stable. No concerns expressed regarding discharge instructions

## 2017-11-25 NOTE — Discharge Instructions (Signed)
1. PAIN CONTROL:  1. Pain is best controlled by a usual combination of three different methods TOGETHER:  1. Ice/Heat 2. Over the counter pain medication 3. Prescription pain medication 2. Most patients will experience some swelling and bruising around wounds. Ice packs or heating pads (30-60 minutes up to 6 times a day) will help. Use ice for the first few days to help decrease swelling and bruising, then switch to heat to help relax tight/sore spots and speed recovery. Some people prefer to use ice alone, heat alone, alternating between ice & heat. Experiment to what works for you. Swelling and bruising can take several weeks to resolve.  3. It is helpful to take an over-the-counter pain medication regularly for the first few weeks. Choose one of the following that works best for you:  1. Naproxen (Aleve, etc) Two 220mg  tabs twice a day 2. Ibuprofen (Advil, etc) Three 200mg  tabs four times a day (every meal & bedtime) 3. Acetaminophen (Tylenol, etc) 500-650mg  four times a day (every meal & bedtime) 4. A prescription for pain medication (such as oxycodone, hydrocodone, etc) should be given to you upon discharge. Take your pain medication as prescribed.  1. If you are having problems/concerns with the prescription medicine (does not control pain, nausea, vomiting, rash, itching, etc), please call us [phone] to see if we need to switch you to a different pain medicine that will work better for you and/or control your side effect better. 2. If you need a refill on your pain medication, please contact your pharmacy. They will contact our office to request authorization. Prescriptions will not be filled after 5 pm or on week-ends. 4. Avoid getting constipated. When taking pain medications, it is common to experience some constipation. Increasing fluid intake and taking a fiber supplement (such as Metamucil, Citrucel, FiberCon, MiraLax, etc) 1-2 times a day regularly will usually help prevent this  problem from occurring. A mild laxative (prune juice, Milk of Magnesia, MiraLax, etc) should be taken according to package directions if there are no bowel movements after 48 hours.  5. Watch out for diarrhea. If you have many loose bowel movements, simplify your diet to bland foods & liquids for a few days. Stop any stool softeners and decrease your fiber supplement. Switching to mild anti-diarrheal medications (Kayopectate, Pepto Bismol) can help. If this worsens or does not improve, please call us. 6. Wash / shower every day. You may shower daily and replace your bandges after showering. No bathing or submerging your wounds in water until they heal. 7. FOLLOW UP in our office  1. Please call [facility] at [phone] to set up an appointment for a follow-up appointment approximately 2-3 weeks after discharge for wound check  WHEN TO CALL us [phone]:  1. Poor pain control 2. Reactions / problems with new medications (rash/itching, nausea, etc)  3. Fever over 101.5 F (38.5 C) 4. Worsening swelling or bruising 5. Continued bleeding from wounds. 6. Increased pain, redness, or drainage from the wounds which could be signs of infection  The clinic staff is available to answer your questions during regular business hours (8:30am-5pm). Please dont hesitate to call and ask to speak to one of our nurses for clinical concerns.  If you have a medical emergency, go to the nearest emergency room or call 911.  A surgeon from [facility] is always on call at the [facility], [place], [place], [place], [place] [address] ?  MAIN: [phone] ? TOLL FREE: [phone] ?  FAX [phone]  [email or web address]  WOUND CARE: - dressing to be changed once daily - supplies: sterile saline, kerlix, scissors, ABD pads, tape  - remove dressing and all packing carefully, moistening with sterile saline as needed to avoid packing/internal  dressing sticking to the wound. - clean edges of skin around the wound with water/gauze, making sure there is no tape debris or leakage left on skin that could cause skin irritation or breakdown. - dampen and clean kerlix with sterile saline and pack wound from wound base to skin level, making sure to take note of any possible areas of wound tracking, tunneling and packing appropriately. Wound can be packed loosely. Trim kerlix to size if a whole kerlix is not required. - cover wound with a dry ABD pad and secure with tape.  - write the date/time on the dry dressing/tape to better track when the last dressing change occurred. - apply any skin protectant/powder recommended by clinician to protect skin/skin folds. - change dressing as needed if leakage occurs, wound gets contaminated, or patient requests to shower. - patient may shower daily with wound open and following the shower the wound should be dried and a clean dressing placed.   Diabetes Mellitus and Food It is important for you to manage your blood sugar (glucose) level. Your blood glucose level can be greatly affected by what you eat. Eating healthier foods in the appropriate amounts throughout the day at about the same time each day will help you control your blood glucose level. It can also help slow or prevent worsening of your diabetes mellitus. Healthy eating may even help you improve the level of your blood pressure and reach or maintain a healthy weight. General recommendations for healthful eating and cooking habits include:  Eating meals and snacks regularly. Avoid going long periods of time without eating to lose weight.  Eating a diet that consists mainly of plant-based foods, such as fruits, vegetables, nuts, legumes, and whole grains.  Using low-heat cooking methods, such as baking, instead of high-heat cooking methods, such as deep frying.  Work with your dietitian to make sure you understand how to use the Nutrition Facts  information on food labels. How can food affect me? Carbohydrates Carbohydrates affect your blood glucose level more than any other type of food. Your dietitian will help you determine how many carbohydrates to eat at each meal and teach you how to count carbohydrates. Counting carbohydrates is important to keep your blood glucose at a healthy level, especially if you are using insulin or taking certain medicines for diabetes mellitus. Alcohol Alcohol can cause sudden decreases in blood glucose (hypoglycemia), especially if you use insulin or take certain medicines for diabetes mellitus. Hypoglycemia can be a life-threatening condition. Symptoms of hypoglycemia (sleepiness, dizziness, and disorientation) are similar to symptoms of having too much alcohol. If your health care provider has given you approval to drink alcohol, do so in moderation and use the following guidelines:  Women should not have more than one drink per day, and men should not have more than two drinks per day. One drink is equal to: ? 12 oz of beer. ? 5 oz of wine. ? 1 oz of hard liquor.  Do not drink on an empty stomach.  Keep yourself hydrated. Have water, diet soda, or unsweetened iced tea.  Regular soda, juice, and other mixers might contain a lot of carbohydrates and should be counted.  What foods are not  recommended? As you make food choices, it is important to remember that all foods are not the same. Some foods have fewer nutrients per serving than other foods, even though they might have the same number of calories or carbohydrates. It is difficult to get your body what it needs when you eat foods with fewer nutrients. Examples of foods that you should avoid that are high in calories and carbohydrates but low in nutrients include:  Trans fats (most processed foods list trans fats on the Nutrition Facts label).  Regular soda.  Juice.  Candy.  Sweets, such as cake, pie, doughnuts, and cookies.  Fried  foods.  What foods can I eat? Eat nutrient-rich foods, which will nourish your body and keep you healthy. The food you should eat also will depend on several factors, including:  The calories you need.  The medicines you take.  Your weight.  Your blood glucose level.  Your blood pressure level.  Your cholesterol level.  You should eat a variety of foods, including:  Protein. ? Lean cuts of meat. ? Proteins low in saturated fats, such as fish, egg whites, and beans. Avoid processed meats.  Fruits and vegetables. ? Fruits and vegetables that may help control blood glucose levels, such as apples, mangoes, and yams.  Dairy products. ? Choose fat-free or low-fat dairy products, such as milk, yogurt, and cheese.  Grains, bread, pasta, and rice. ? Choose whole grain products, such as multigrain bread, whole oats, and brown rice. These foods may help control blood pressure.  Fats. ? Foods containing healthful fats, such as nuts, avocado, olive oil, canola oil, and fish.  Does everyone with diabetes mellitus have the same meal plan? Because every person with diabetes mellitus is different, there is not one meal plan that works for everyone. It is very important that you meet with a dietitian who will help you create a meal plan that is just right for you. This information is not intended to replace advice given to you by your health care provider. Make sure you discuss any questions you have with your health care provider. Document Released: 09/04/2005 Document Revised: 05/15/2016 Document Reviewed: 11/04/2013 Elsevier Interactive Patient Education  2017 [place] need to follow up with your primary care provider as soon as possible to discuss medication options for your diabetes. We did not start you on Trulicity. Ms. Magda Kiel will manage your diabetes medicine at your office visit.

## 2017-11-25 NOTE — Progress Notes (Signed)
Inpatient Diabetes Program Recommendations  AACE/ADA: New Consensus Statement on Inpatient Glycemic Control (2015)  Target Ranges:  Prepandial:   less than 140 mg/dL      Peak postprandial:   less than 180 mg/dL (1-2 hours)      Critically ill patients:  140 - 180 mg/dL   Lab Results  Component Value Date   GLUCAP 231 (H) 11/25/2017   HGBA1C 12.1 (H) 11/23/2017    Review of Glycemic Control Results for JAVARES, KAUFHOLD (MRN [id number]) as of 11/25/2017 09:13  Ref. Range 11/24/2017 14:35 11/24/2017 15:28 11/24/2017 16:52 11/24/2017 21:43 11/25/2017 07:38  Glucose-Capillary Latest Ref Range: 65 - 99 mg/dL 192 (H) 158 (H) 151 (H) 270 (H) 231 (H)    Inpatient Diabetes Program Recommendations:   Spoke with PA Jackson Latino regarding medication management. If patient doesn't start insulin, please consider Trulicity in addition to current medication regimen. 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  Spoke with pt about A1C 12.1 (average CBG 301 over the past 2-3 months) and explained what an A1C is, basic pathophysiology of DM Type 2, basic home care, basic diabetes diet nutrition principles, importance of checking CBGs and maintaining good CBG control to prevent long-term and short-term complications. Reviewed signs and symptoms of hyperglycemia and hypoglycemia and how to treat hypoglycemia at home. Also reviewed blood sugar goals at home.   Thank you, Nani Gasser. Raeghan Demeter, RN, MSN, CDE  Diabetes Coordinator Inpatient Glycemic Control Team Team Pager [phone] (8am-5pm) 11/25/2017 9:26 AM

## 2017-11-25 NOTE — Discharge Summary (Signed)
[facility] Discharge Summary   Patient ID: Alexander [name] MRN: [id number] DOB/AGE: 52-Aug-1967 52 y.o.  Admit date: 11/23/2017 Discharge date: 11/25/2017  Admitting Diagnosis: Left medial thigh abscess  Discharge Diagnosis Patient Active Problem List   Diagnosis Date Noted  . Abscess of left thigh 11/23/2017  . Abnormal finding on MRI of brain 04/21/2014  . Impotence of organic origin 09/24/2013  . HTN (hypertension) 12/17/2012  . Diabetes mellitus, type 2 ([place]) 12/17/2012  . Hyperlipidemia 12/17/2012    Consultants none  Imaging: Ct Pelvis W Contrast  Result Date: 11/23/2017 CLINICAL DATA:  Left inguinal abscess EXAM: CT PELVIS WITH CONTRAST TECHNIQUE: Multidetector CT imaging of the pelvis was performed using the standard protocol following the bolus administration of intravenous contrast. CONTRAST:  134mL ISOVUE-300 IOPAMIDOL (ISOVUE-300) INJECTION 61% COMPARISON:  None. FINDINGS: Urinary Tract: Bladder is well distended. Visualized kidneys are within normal limits. No obstructive changes are seen. Bowel: No inflammatory changes of the bowel are seen although fecal material is noted throughout the visualized colon. This may be related to a degree of constipation. No definitive obstructive changes are seen. Vascular/Lymphatic: Vascular structures appear within normal limits. The left inguinal lymph nodes are mildly prominent likely of reactive nature. Reproductive: Prostate appears within normal limits. No focal abnormality is noted. Other: In the medial aspect of the left proximal thigh there is an irregular fluid collection with peripheral enhancement and surrounding edema which measures approximately 6.5 x 1.9 cm in greatest dimension. A few small foci of air are noted within consistent with the given clinical history of abscess. This appears to extend to the skin surface. No other focal abscess is noted. The collection terminates just below the inguinal crease.  Musculoskeletal: Postsurgical changes are noted in the proximal right femur. No other significant bony abnormality is seen. IMPRESSION: Subcutaneous abscess consistent with the given clinical history in the medial aspect of the proximal left thigh as described. It appears to extend to the skin surface. Diffuse foci of air are noted within. Electronically Signed   By: Inez Catalina M.D.   On: 11/23/2017 16:39    Procedures Dr. Brantley Stage (11/24/17) - Incision and drainage left medial thigh abscess  HPI: 51 year old male who presents with a subcutaneous abscess. He is presenting with a 3-week history of a persistent left groin abscess. About 3 weeks ago, when he was in [place], [place], he noticed a small lump near his groin which became larger within 3-4 days so he had it drained and was started on 2 different antibiotics (he believes was doxycycline and bactrim). He did not have much improvement, so he went to [facility] on 11/14/17 to have it incised and drained again. The ER provider placed him on doxycycline which he has completed. He states prior to 3 weeks ago, he never noticed a lump in this area and this has never happened before.  He is presented to surgery office for follow-up but states the area has not improved, and instead has become more painful.  It has been as large as it is for about a week He states the drainage was decreasing but in the past 2 days it has drained more than it has in the past 3 weeks. He has applied warm compresses without much relief. He denies fevers Denies N/V.  He had uncontrolled diabetes in the past, but states he's been taking his medication as prescribed. He reports a fasting blood glucose near 200 last week. Hemoglobin A1c in November 2017 was  11.9. He does not smoke.  Hospital Course:  Patient was admitted and underwent procedure listed above.  Tolerated procedure well and was transferred to the floor.  Diet was advanced as tolerated.  On POD#1, the  patient was voiding well, tolerating diet, ambulating well, pain well controlled, vital signs stable, wound well appearing and felt stable for discharge home. Diabetes coordinator saw pt and recommended insulin. Pt is a Administrator. I spoke to his PCP, Ms. McVey PA-C at [facility] and asked that they schedule an appointment for the pt for this week for follow up regarding his high blood sugar levels. I have asked his PCP to see him to manage his diabetes medicine. I prescribed his Actos which he does not believe he is taking but I did not start him on the trulicity as the diabetes coordinator recommended. I will allow his PCP to manage this.   Patient will follow up in our office in 2 weeks for wound check and knows to call with questions or concerns. He knows to f/u with his PCP this week. He will call to confirm appointment date/time with [facility].    Patient was discharged in good condition.  The [place] Substance controlled database was reviewed prior to prescribing narcotic pain medication to this patient.  Physical Exam: General:  Alert, NAD, pleasant, cooperative Cardio: RRR, S1 & S2 normal, no murmur, rubs, gallops Resp: Effort normal, lungs CTA bilaterally, no wheezes, rales, rhonchi Skin: warm and dry. Wound of left thigh is without bleeding or purulent drainage. Large area of induration and surrounding erythema. Repacked with wet to dry dressings.   Allergies as of 11/25/2017   No Known Allergies     Medication List    STOP taking these medications   doxycycline 100 MG capsule Commonly known as:  VIBRAMYCIN   glipiZIDE 10 MG tablet Commonly known as:  GLUCOTROL   morphine 15 MG tablet Commonly known as:  MSIR     TAKE these medications   aspirin 81 MG tablet Take 81 mg by mouth every other day.   clindamycin 150 MG capsule Commonly known as:  CLEOCIN Take 1 capsule (150 mg total) by mouth every 6 (six) hours.   clotrimazole 1 % cream Commonly known as:   LOTRIMIN Apply 1 application topically 2 (two) times daily.   Continuous Blood Gluc Sensor Misc 1 each by Does not apply route as directed. Use as directed every 10 days. May dispense FreeStyle [employer] or similar.   losartan 100 MG tablet Commonly known as:  COZAAR TAKE 1 TABLET BY MOUTH  DAILY   metFORMIN 1000 MG tablet Commonly known as:  GLUCOPHAGE TAKE 1 TABLET BY MOUTH 2  TIMES DAILY WITH A MEAL   multivitamin tablet Take 1 tablet by mouth daily.   oxyCODONE-acetaminophen 5-325 MG tablet Commonly known as:  PERCOCET/ROXICET Take 1 tablet by mouth every 4 (four) hours as needed for moderate pain.   pioglitazone 15 MG tablet Commonly known as:  ACTOS Take 1 tablet (15 mg total) by mouth daily.   sildenafil 100 MG tablet Commonly known as:  VIAGRA TAKE 1 TABLET BY MOUTH AS NEEDED FOR ERECTILE DYSFUNCTION   simvastatin 40 MG tablet Commonly known as:  ZOCOR Take 1 tablet by mouth  daily  Need appointment for  refills What changed:  Another medication with the same name was removed. Continue taking this medication, and follow the directions you see here.        Follow-up  [facility], [place]. Call.   Specialty:  General Surgery Why:  we are setting up a follow up appointment for you. Please call our office to see when your appointment is. Contact information: [address], [place], [place]. Schedule an appointment as soon as possible for a visit in 1 week(s).   Specialties:  Physician Assistant, Family Medicine Why:  for follow up regarding your diabetes Contact information: [place] [address] [phone]           Signed: [facility] 11/25/2017, 9:57 AM Pager: [phone] Consults: [phone] Mon-Fri 7:00 am-4:30 pm Sat-Sun 7:00 am-11:30 am

## 2017-11-27 ENCOUNTER — Ambulatory Visit (INDEPENDENT_AMBULATORY_CARE_PROVIDER_SITE_OTHER): Payer: 59 | Admitting: Physician Assistant

## 2017-11-27 ENCOUNTER — Encounter: Payer: Self-pay | Admitting: Physician Assistant

## 2017-11-27 ENCOUNTER — Other Ambulatory Visit: Payer: Self-pay

## 2017-11-27 VITALS — BP 140/102 | HR 100 | Temp 97.9°F | Resp 16 | Ht 68.5 in | Wt 226.2 lb

## 2017-11-27 DIAGNOSIS — E11628 Type 2 diabetes mellitus with other skin complications: Secondary | ICD-10-CM | POA: Diagnosis not present

## 2017-11-27 DIAGNOSIS — Z09 Encounter for follow-up examination after completed treatment for conditions other than malignant neoplasm: Secondary | ICD-10-CM

## 2017-11-27 NOTE — Patient Instructions (Addendum)
You will receive a phone call to schedule an appt with endocrinology. 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  Start walking again!  Come back and see me in 6-8 weeks.    Diabetes Mellitus and Exercise Exercising regularly is important for your overall health, especially when you have diabetes (diabetes mellitus). Exercising is not only about losing weight. It has many health benefits, such as increasing muscle strength and bone density and reducing body fat and stress. This leads to improved fitness, flexibility, and endurance, all of which result in better overall health. Exercise has additional benefits for people with diabetes, including:  Reducing appetite.  Helping to lower and control blood glucose.  Lowering blood pressure.  Helping to control amounts of fatty substances (lipids) in the blood, such as cholesterol and triglycerides.  Helping the body to respond better to insulin (improving insulin sensitivity).  Reducing how much insulin the body needs.  Decreasing the risk for heart disease by: ? Lowering cholesterol and triglyceride levels. ? Increasing the levels of good cholesterol. ? Lowering blood glucose levels.  What is my activity plan? Your health care provider or certified diabetes educator can help you make a plan for the type and frequency of exercise (activity plan) that works for you. Make sure that you:  Do at least 150 minutes of moderate-intensity or vigorous-intensity exercise each week. This could be brisk walking, biking, or water aerobics. ? Do stretching and strength exercises, such as yoga or weightlifting, at least 2 times a week. ? Spread out your activity over at least 3 days of the week.  Get some form of physical activity every day. ? Do not go more than 2 days in a row without some kind of physical activity. ? Avoid being inactive for more than 90 minutes at a time. Take frequent breaks to walk or  stretch.  Choose a type of exercise or activity that you enjoy, and set realistic goals.  Start slowly, and gradually increase the intensity of your exercise over time.  What do I need to know about managing my diabetes?  Check your blood glucose before and after exercising. ? If your blood glucose is higher than 240 mg/dL (13.3 mmol/L) before you exercise, check your urine for ketones. If you have ketones in your urine, do not exercise until your blood glucose returns to normal.  Know the symptoms of low blood glucose (hypoglycemia) and how to treat it. Your risk for hypoglycemia increases during and after exercise. Common symptoms of hypoglycemia can include: ? Hunger. ? Anxiety. ? Sweating and feeling clammy. ? Confusion. ? Dizziness or feeling light-headed. ? Increased heart rate or palpitations. ? Blurry vision. ? Tingling or numbness around the mouth, lips, or tongue. ? Tremors or shakes. ? Irritability.  Keep a rapid-acting carbohydrate snack available before, during, and after exercise to help prevent or treat hypoglycemia.  Avoid injecting insulin into areas of the body that are going to be exercised. For example, avoid injecting insulin into: ? The arms, when playing tennis. ? The legs, when jogging.  Keep records of your exercise habits. Doing this can help you and your health care provider adjust your diabetes management plan as needed. Write down: ? Food that you eat before and after you exercise. ? Blood glucose levels before and after you exercise. ? The type and amount of exercise you have done. ? When your insulin is expected to peak, if you use insulin. Avoid  exercising at times when your insulin is peaking.  When you start a new exercise or activity, work with your health care provider to make sure the activity is safe for you, and to adjust your insulin, medicines, or food intake as needed.  Drink plenty of water while you exercise to prevent dehydration or  heat stroke. Drink enough fluid to keep your urine clear or pale yellow. This information is not intended to replace advice given to you by your health care provider. Make sure you discuss any questions you have with your health care provider. Document Released: 02/28/2004 Document Revised: 06/27/2016 Document Reviewed: 05/19/2016 Elsevier Interactive Patient Education  [employer].   Thank you for coming in today. I hope you feel we met your needs.  Feel free to call PCP if you have any questions or further requests.  Please consider signing up for MyChart if you do not already have it, as this is a great way to communicate with me.  Best,  Whitney McVey, PA-C   IF you received an x-ray today, you will receive an invoice from [facility]. Please contact [facility] at [phone] with questions or concerns regarding your invoice.   IF you received labwork today, you will receive an invoice from [facility]. Please contact [facility] at [phone] with questions or concerns regarding your invoice.   Our billing staff will not be able to assist you with questions regarding bills from these companies.  You will be contacted with the lab results as soon as they are available. The fastest way to get your results is to activate your My Chart account. Instructions are located on the last page of this paperwork. If you have not heard from [place] regarding the results in 2 weeks, please contact this office.

## 2017-11-27 NOTE — Progress Notes (Signed)
Alexander [name]  MRN: [id number] DOB: 07/27/1966  PCP: Mancel Bale, PA-C  Subjective:  Pt is a 51 year old male PMH DM, HTN, HLD presents to clinic for hospital follow-up s/p irrigation and debridement of left thigh abscess. He is feeling better. Is changing dressings daily. Denies fever, chills, increasing pain or warmth.  Hemoglobin A1C 11/23/2017 12.1%.   DM - metformin 1,000 mg bid, Actos 15mg .  He checks home blood sugars. He went to endocrinology last year - cannot recall what was discussed.  He endorses medication noncompliance. He is a Administrator and cannot take insulin. He is no longer exercising.  C/o intermittent yeast infections on penis. No presently.   Review of Systems  Constitutional: Negative for chills, diaphoresis, fatigue and fever.  Skin: Positive for wound.    Patient Active Problem List   Diagnosis Date Noted  . Abscess of left thigh 11/23/2017  . Abnormal finding on MRI of brain 04/21/2014  . Impotence of organic origin 09/24/2013  . HTN (hypertension) 12/17/2012  . Diabetes mellitus, type 2 ([place]) 12/17/2012  . Hyperlipidemia 12/17/2012    Current Outpatient Medications on File Prior to Visit  Medication Sig Dispense Refill  . aspirin 81 MG tablet Take 81 mg by mouth every other day.    . clindamycin (CLEOCIN) 150 MG capsule Take 1 capsule (150 mg total) by mouth every 6 (six) hours. 28 capsule 0  . clotrimazole (LOTRIMIN) 1 % cream Apply 1 application topically 2 (two) times daily. 30 g 0  . Continuous Blood Gluc Sensor MISC 1 each by Does not apply route as directed. Use as directed every 10 days. May dispense FreeStyle [employer] or similar. 3 each 0  . losartan (COZAAR) 100 MG tablet TAKE 1 TABLET BY MOUTH  DAILY 90 tablet 0  . metFORMIN (GLUCOPHAGE) 1000 MG tablet TAKE 1 TABLET BY MOUTH 2  TIMES DAILY WITH A MEAL 180 tablet 3  . Multiple Vitamin (MULTIVITAMIN) tablet Take 1 tablet by mouth daily.    Marland Kitchen oxyCODONE-acetaminophen  (PERCOCET/ROXICET) 5-325 MG tablet Take 1 tablet by mouth every 4 (four) hours as needed for moderate pain. 10 tablet 0  . pioglitazone (ACTOS) 15 MG tablet Take 1 tablet (15 mg total) by mouth daily. 30 tablet 0  . sildenafil (VIAGRA) 100 MG tablet TAKE 1 TABLET BY MOUTH AS NEEDED FOR ERECTILE DYSFUNCTION 6 tablet 1  . simvastatin (ZOCOR) 40 MG tablet Take 1 tablet by mouth  daily  Need appointment for  refills 90 tablet 3   No current facility-administered medications on file prior to visit.     No Known Allergies   Objective:  BP (!) 140/102   Pulse 100   Temp 97.9 F (36.6 C) (Oral)   Resp 16   Ht 5' 8.5" (1.74 m)   Wt 226 lb 3.2 oz (102.6 kg)   SpO2 99%   BMI 33.89 kg/m   Diabetic Foot Exam - Simple   Simple Foot Form Diabetic Foot exam was performed with the following findings:  Yes 11/27/2017  9:47 AM  Visual Inspection No deformities, no ulcerations, no other skin breakdown bilaterally:  Yes Sensation Testing Intact to touch and monofilament testing bilaterally:  Yes Pulse Check Posterior Tibialis and Dorsalis pulse intact bilaterally:  Yes Comments     Physical Exam  Constitutional: He is oriented to person, place, and time and well-developed, well-nourished, and in no distress. No distress.  Cardiovascular: Normal rate, regular rhythm and normal heart sounds.  Neurological: He is alert and oriented to person, place, and time. GCS score is 15.  Skin: Skin is warm and dry.     Psychiatric: Mood, memory, affect and judgment normal.  Vitals reviewed.   Assessment and Plan :  1. Hospital discharge follow-up 2. Type 2 diabetes mellitus with other skin complication, without long-term current use of insulin (HCC) - Ambulatory referral to Endocrinology - Pt presents for hospital f/u s/p  irrigation and debridement of left thigh abscess. 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 Discussed at length need to control his  blood sugar. Informed pt abscess and infections of penis are most likely due to uncontrolled DM. Plan to refer to endocrinology for evaluation and medication management. He understands and agrees with plan. RTC in 6-8 weeks for recheck.    Mercer Pod, PA-C  [facility] at [place] 11/27/2017 10:01 AM

## 2017-11-28 ENCOUNTER — Other Ambulatory Visit: Payer: Self-pay | Admitting: Physician Assistant

## 2017-11-28 DIAGNOSIS — E785 Hyperlipidemia, unspecified: Secondary | ICD-10-CM

## 2017-11-28 DIAGNOSIS — E119 Type 2 diabetes mellitus without complications: Secondary | ICD-10-CM

## 2017-12-02 LAB — AEROBIC/ANAEROBIC CULTURE W GRAM STAIN (SURGICAL/DEEP WOUND)

## 2017-12-02 LAB — AEROBIC/ANAEROBIC CULTURE (SURGICAL/DEEP WOUND)

## 2017-12-07 ENCOUNTER — Telehealth: Payer: Self-pay | Admitting: Physician Assistant

## 2017-12-07 ENCOUNTER — Other Ambulatory Visit: Payer: Self-pay

## 2017-12-07 ENCOUNTER — Other Ambulatory Visit: Payer: Self-pay | Admitting: Family Medicine

## 2017-12-07 DIAGNOSIS — I10 Essential (primary) hypertension: Secondary | ICD-10-CM

## 2017-12-07 MED ORDER — SIMVASTATIN 40 MG PO TABS: ORAL_TABLET | ORAL | 0 refills | Status: DC

## 2017-12-07 MED ORDER — LOSARTAN POTASSIUM 100 MG PO TABS: 100.0000 mg | ORAL_TABLET | Freq: Every day | ORAL | 0 refills | Status: DC

## 2017-12-07 NOTE — Telephone Encounter (Signed)
Copied from [place] [phone]. Topic: General - Other >> Dec 07, 2017  8:51 AM Darl Householder, RMA wrote: Reason for CRM: Medication refill request for simvastatin 40 mg and losartan 100 mg [facility] [place], pt states he never received mail order and he is going out of town today and needs refill

## 2017-12-14 ENCOUNTER — Telehealth: Payer: Self-pay | Admitting: Physician Assistant

## 2017-12-14 NOTE — Telephone Encounter (Signed)
Received call from [place] at [facility] to verify the dose and quantity of glipizide tablets; Layla Maw Pharmacist  Can be reached [phone]' he states that the pharmacy would prefer to change quantitiy to 360; please reference order #[id number]; will route to pool at [facility] for further disposition.

## 2018-01-03 ENCOUNTER — Other Ambulatory Visit: Payer: Self-pay | Admitting: Physician Assistant

## 2018-01-11 ENCOUNTER — Ambulatory Visit: Payer: 59 | Admitting: Physician Assistant

## 2018-01-19 ENCOUNTER — Ambulatory Visit: Payer: 59 | Admitting: Physician Assistant

## 2018-02-03 ENCOUNTER — Other Ambulatory Visit: Payer: Self-pay | Admitting: Physician Assistant

## 2018-02-03 DIAGNOSIS — I10 Essential (primary) hypertension: Secondary | ICD-10-CM

## 2018-02-06 ENCOUNTER — Other Ambulatory Visit: Payer: Self-pay

## 2018-02-06 ENCOUNTER — Other Ambulatory Visit: Payer: Self-pay | Admitting: Physician Assistant

## 2018-02-15 ENCOUNTER — Other Ambulatory Visit: Payer: Self-pay | Admitting: Physician Assistant

## 2018-02-15 DIAGNOSIS — I10 Essential (primary) hypertension: Secondary | ICD-10-CM

## 2018-02-17 ENCOUNTER — Other Ambulatory Visit: Payer: Self-pay | Admitting: Physician Assistant

## 2018-02-17 DIAGNOSIS — N529 Male erectile dysfunction, unspecified: Secondary | ICD-10-CM

## 2018-02-17 DIAGNOSIS — E785 Hyperlipidemia, unspecified: Secondary | ICD-10-CM

## 2018-02-17 DIAGNOSIS — E119 Type 2 diabetes mellitus without complications: Secondary | ICD-10-CM

## 2018-02-17 NOTE — Telephone Encounter (Signed)
Copied from [place] [phone]. Topic: Quick Communication - Rx Refill/Question >> Feb 17, 2018  4:01 PM Aurelio Brash B wrote: Medication: metFORMIN (GLUCOPHAGE) 1000 MG tablet and  sildenafil (VIAGRA) 100 MG tablet  Has the patient contacted their pharmacy? Yes   (Agent: If no, request that the patient contact the pharmacy for the refill.)   Preferred Pharmacy (with phone number or street name): [facility], [place] [phone] (Phone) [phone] (Fax)    Agent: Please be advised that RX refills may take up to 3 business days. We ask that you follow-up with your pharmacy.

## 2018-02-18 NOTE — Telephone Encounter (Signed)
LOV 12/18.    Follow up appts on 01/11/18 and 01/19/18 were cancelled by pt.  Whitney McVey  [facility] mail order in [place]

## 2018-02-19 MED ORDER — SILDENAFIL CITRATE 100 MG PO TABS: ORAL_TABLET | ORAL | 0 refills | Status: DC

## 2018-02-19 MED ORDER — METFORMIN HCL 1000 MG PO TABS: ORAL_TABLET | ORAL | 0 refills | Status: DC

## 2018-02-25 ENCOUNTER — Other Ambulatory Visit: Payer: Self-pay | Admitting: Physician Assistant

## 2018-02-25 DIAGNOSIS — E785 Hyperlipidemia, unspecified: Secondary | ICD-10-CM

## 2018-02-25 DIAGNOSIS — E119 Type 2 diabetes mellitus without complications: Secondary | ICD-10-CM

## 2018-03-22 ENCOUNTER — Encounter: Payer: Self-pay | Admitting: Physician Assistant

## 2018-04-09 ENCOUNTER — Ambulatory Visit: Payer: 59 | Admitting: Internal Medicine

## 2018-04-21 ENCOUNTER — Other Ambulatory Visit: Payer: Self-pay | Admitting: *Deleted

## 2018-05-10 ENCOUNTER — Ambulatory Visit: Payer: 59 | Admitting: Internal Medicine

## 2018-05-19 ENCOUNTER — Other Ambulatory Visit: Payer: Self-pay | Admitting: Physician Assistant

## 2018-05-19 ENCOUNTER — Telehealth: Payer: Self-pay | Admitting: Physician Assistant

## 2018-05-19 DIAGNOSIS — N529 Male erectile dysfunction, unspecified: Secondary | ICD-10-CM

## 2018-05-19 DIAGNOSIS — E785 Hyperlipidemia, unspecified: Secondary | ICD-10-CM

## 2018-05-19 DIAGNOSIS — I10 Essential (primary) hypertension: Secondary | ICD-10-CM

## 2018-05-19 DIAGNOSIS — E119 Type 2 diabetes mellitus without complications: Secondary | ICD-10-CM

## 2018-05-19 NOTE — Telephone Encounter (Signed)
Copied from [place] [phone]. Topic: Quick Communication - Rx Refill/Question >> May 19, 2018 12:36 PM Yvette Rack wrote: Medication: sildenafil (VIAGRA) 100 MG tablet    simvastatin (ZOCOR) 40 MG tablet  Has the patient contacted their pharmacy? Yes.   (Agent: If no, request that the patient contact the pharmacy for the refill.) (Agent: If yes, when and what did the pharmacy advise?)  Preferred Pharmacy (with phone number or street name):   [id number] [place], [place] [phone] (Phone) [phone] (Fax)      Agent: Please be advised that RX refills may take up to 3 business days. We ask that you follow-up with your pharmacy.

## 2018-05-19 NOTE — Telephone Encounter (Signed)
Refill for slidenafil 100 mg tab, last refill on 02/19/18 for #6 tabs Simvastatin 40 mg tab , last refill was 02/06/18 for #30 tabs Last lipid panel was 10/21/17 Juanda Crumble, PA [facility] [place], [place]

## 2018-05-20 ENCOUNTER — Other Ambulatory Visit: Payer: Self-pay | Admitting: Physician Assistant

## 2018-05-20 DIAGNOSIS — N529 Male erectile dysfunction, unspecified: Secondary | ICD-10-CM

## 2018-05-20 NOTE — Telephone Encounter (Signed)
Patient calling because his [facility] was [facility] but [facility] [place]. sildenafil (VIAGRA) 100 MG tablet [Pharmacy Med Name: SILDENAFIL 100MG  TAB]. [facility] to  [id number] [place], [address]  [address] [place] [address]  Phone: [phone] Fax: [phone]   Going out of the country tomorrow.

## 2018-05-21 NOTE — Telephone Encounter (Signed)
Refill of Viagra  LOV 11/27/17 W. McVey  LRF 02/19/18  #36  0 refills   [id number] [place], [place] - [place]

## 2018-05-21 NOTE — Telephone Encounter (Signed)
Patient last seen 11/27/2017 for hospital discharge follow up. Visit where erectile dysfunction last discussed 10/21/2016- more than a year ago. Patient was supposed to follow up with Whitney 6-8 weeks after last office visit in December 2018. Needs office visit for further prescriptions. Please call patient to schedule.

## 2018-05-22 ENCOUNTER — Other Ambulatory Visit: Payer: Self-pay | Admitting: Physician Assistant

## 2018-05-22 DIAGNOSIS — E119 Type 2 diabetes mellitus without complications: Secondary | ICD-10-CM

## 2018-05-22 DIAGNOSIS — E785 Hyperlipidemia, unspecified: Secondary | ICD-10-CM

## 2018-05-28 ENCOUNTER — Encounter: Payer: Self-pay | Admitting: Physician Assistant

## 2018-05-28 ENCOUNTER — Other Ambulatory Visit: Payer: Self-pay

## 2018-05-28 ENCOUNTER — Ambulatory Visit (INDEPENDENT_AMBULATORY_CARE_PROVIDER_SITE_OTHER): Payer: 59 | Admitting: Physician Assistant

## 2018-05-28 VITALS — BP 128/86 | HR 98 | Temp 97.7°F | Resp 16 | Ht 68.5 in | Wt 216.4 lb

## 2018-05-28 DIAGNOSIS — I10 Essential (primary) hypertension: Secondary | ICD-10-CM

## 2018-05-28 DIAGNOSIS — E785 Hyperlipidemia, unspecified: Secondary | ICD-10-CM | POA: Diagnosis not present

## 2018-05-28 DIAGNOSIS — N529 Male erectile dysfunction, unspecified: Secondary | ICD-10-CM

## 2018-05-28 DIAGNOSIS — Z113 Encounter for screening for infections with a predominantly sexual mode of transmission: Secondary | ICD-10-CM | POA: Diagnosis not present

## 2018-05-28 DIAGNOSIS — Z0000 Encounter for general adult medical examination without abnormal findings: Secondary | ICD-10-CM | POA: Diagnosis not present

## 2018-05-28 DIAGNOSIS — E119 Type 2 diabetes mellitus without complications: Secondary | ICD-10-CM

## 2018-05-28 DIAGNOSIS — N481 Balanitis: Secondary | ICD-10-CM | POA: Diagnosis not present

## 2018-05-28 LAB — POCT URINALYSIS DIP (MANUAL ENTRY)
Bilirubin, UA: NEGATIVE
Blood, UA: NEGATIVE
Glucose, UA: 500 mg/dL — AB
Leukocytes, UA: NEGATIVE
Nitrite, UA: NEGATIVE
Protein Ur, POC: 100 mg/dL — AB
Spec Grav, UA: 1.02 (ref 1.010–1.025)
Urobilinogen, UA: 0.2 U/dL
pH, UA: 5.5 (ref 5.0–8.0)

## 2018-05-28 LAB — CMP14+EGFR
ALT: 12 IU/L (ref 0–44)
AST: 11 IU/L (ref 0–40)
Albumin/Globulin Ratio: 1.4 (ref 1.2–2.2)
Albumin: 4.2 g/dL (ref 3.5–5.5)
Alkaline Phosphatase: 66 IU/L (ref 39–117)
BUN/Creatinine Ratio: 15 (ref 9–20)
BUN: 13 mg/dL (ref 6–24)
Bilirubin Total: 0.8 mg/dL (ref 0.0–1.2)
CO2: 21 mmol/L (ref 20–29)
Calcium: 9.3 mg/dL (ref 8.7–10.2)
Chloride: 97 mmol/L (ref 96–106)
Creatinine, Ser: 0.85 mg/dL (ref 0.76–1.27)
GFR calc Af Amer: 115 mL/min/{1.73_m2} (ref >59)
GFR calc non Af Amer: 99 mL/min/{1.73_m2} (ref >59)
Globulin, Total: 3 g/dL (ref 1.5–4.5)
Glucose: 351 mg/dL — ABNORMAL HIGH (ref 65–99)
Potassium: 4.4 mmol/L (ref 3.5–5.2)
Sodium: 135 mmol/L (ref 134–144)
Total Protein: 7.2 g/dL (ref 6.0–8.5)

## 2018-05-28 LAB — POCT GLYCOSYLATED HEMOGLOBIN (HGB A1C): Hemoglobin A1C: 14 % — AB (ref 4.0–5.6)

## 2018-05-28 LAB — LIPID PANEL
Chol/HDL Ratio: 2.8 ratio (ref 0.0–5.0)
Cholesterol, Total: 176 mg/dL (ref 100–199)
HDL: 63 mg/dL (ref >39)
LDL Calculated: 92 mg/dL (ref 0–99)
Triglycerides: 104 mg/dL (ref 0–149)
VLDL Cholesterol Cal: 21 mg/dL (ref 5–40)

## 2018-05-28 MED ORDER — CLOTRIMAZOLE 1 % EX CREA: 1.0000 "application " | TOPICAL_CREAM | Freq: Two times a day (BID) | CUTANEOUS | 0 refills | Status: DC

## 2018-05-28 MED ORDER — GLIPIZIDE 10 MG PO TABS: ORAL_TABLET | ORAL | 3 refills | Status: DC

## 2018-05-28 MED ORDER — FLUCONAZOLE 150 MG PO TABS: 150.0000 mg | ORAL_TABLET | Freq: Once | ORAL | 0 refills | Status: AC

## 2018-05-28 MED ORDER — PIOGLITAZONE HCL 15 MG PO TABS: 15.0000 mg | ORAL_TABLET | Freq: Every day | ORAL | 3 refills | Status: DC

## 2018-05-28 MED ORDER — ASPIRIN 81 MG PO TABS: 81.0000 mg | ORAL_TABLET | ORAL | 11 refills | Status: AC

## 2018-05-28 MED ORDER — SILDENAFIL CITRATE 100 MG PO TABS: ORAL_TABLET | ORAL | 2 refills | Status: DC

## 2018-05-28 MED ORDER — LOSARTAN POTASSIUM 100 MG PO TABS: 100.0000 mg | ORAL_TABLET | Freq: Every day | ORAL | 3 refills | Status: DC

## 2018-05-28 NOTE — Patient Instructions (Addendum)
For your penis - start diflucan, take 1 pill then take another 72 hours later.  Do not take zocor/ viagra while on antifungal Start taking your diabetes medications as directed. We need to get better control of your blood sugar. Please keep the appointment with the endocrinologist. Come back and see me in October.    Your diabetes, hypertension and cholesterol medications will be sent via [facility].     Health Maintenance, Male A healthy lifestyle and preventive care is important for your health and wellness. Ask your health care provider about what schedule of regular examinations is right for you. What should I know about weight and diet? Eat a Healthy Diet  Eat plenty of vegetables, fruits, whole grains, low-fat dairy products, and lean protein.  Do not eat a lot of foods high in solid fats, added sugars, or salt.  Maintain a Healthy Weight Regular exercise can help you achieve or maintain a healthy weight. You should:  Do at least 150 minutes of exercise each week. The exercise should increase your heart rate and make you sweat (moderate-intensity exercise).  Do strength-training exercises at least twice a week.  Watch Your Levels of Cholesterol and Blood Lipids  Have your blood tested for lipids and cholesterol every 5 years starting at 53 years of age. If you are at high risk for heart disease, you should start having your blood tested when you are 53 years old. You may need to have your cholesterol levels checked more often if: ? Your lipid or cholesterol levels are high. ? You are older than 53 years of age. ? You are at high risk for heart disease.  What should I know about cancer screening? Many types of cancers can be detected early and may often be prevented. Lung Cancer  You should be screened every year for lung cancer if: ? You are a current smoker who has smoked for at least 30 years. ? You are a former smoker who has quit within the past 15 years.  Talk to your  health care provider about your screening options, when you should start screening, and how often you should be screened.  Colorectal Cancer  Routine colorectal cancer screening usually begins at 53 years of age and should be repeated every 5-10 years until you are 53 years old. You may need to be screened more often if early forms of precancerous polyps or small growths are found. Your health care provider may recommend screening at an earlier age if you have risk factors for colon cancer.  Your health care provider may recommend using home test kits to check for hidden blood in the stool.  A small camera at the end of a tube can be used to examine your colon (sigmoidoscopy or colonoscopy). This checks for the earliest forms of colorectal cancer.  Prostate and Testicular Cancer  Depending on your age and overall health, your health care provider may do certain tests to screen for prostate and testicular cancer.  Talk to your health care provider about any symptoms or concerns you have about testicular or prostate cancer.  Skin Cancer  Check your skin from head to toe regularly.  Tell your health care provider about any new moles or changes in moles, especially if: ? There is a change in a mole's size, shape, or color. ? You have a mole that is larger than a pencil eraser.  Always use sunscreen. Apply sunscreen liberally and repeat throughout the day.  Protect yourself by wearing long  sleeves, pants, a wide-brimmed hat, and sunglasses when outside.  What should I know about heart disease, diabetes, and high blood pressure?  If you are 25-66 years of age, have your blood pressure checked every 3-5 years. If you are 77 years of age or older, have your blood pressure checked every year. You should have your blood pressure measured twice-once when you are at a hospital or clinic, and once when you are not at a hospital or clinic. Record the average of the two measurements. To check your  blood pressure when you are not at a hospital or clinic, you can use: ? An automated blood pressure machine at a pharmacy. ? A home blood pressure monitor.  Talk to your health care provider about your target blood pressure.  If you are between 47-47 years old, ask your health care provider if you should take aspirin to prevent heart disease.  Have regular diabetes screenings by checking your fasting blood sugar level. ? If you are at a normal weight and have a low risk for diabetes, have this test once every three years after the age of 52. ? If you are overweight and have a high risk for diabetes, consider being tested at a younger age or more often.  A one-time screening for abdominal aortic aneurysm (AAA) by ultrasound is recommended for men aged 88-75 years who are current or former smokers. What should I know about preventing infection? Hepatitis B If you have a higher risk for hepatitis B, you should be screened for this virus. Talk with your health care provider to find out if you are at risk for hepatitis B infection. Hepatitis C Blood testing is recommended for:  Everyone born from [birth year] through 1966.  Anyone with known risk factors for hepatitis C.  Sexually Transmitted Diseases (STDs)  You should be screened each year for STDs including gonorrhea and chlamydia if: ? You are sexually active and are younger than 53 years of age. ? You are older than 53 years of age and your health care provider tells you that you are at risk for this type of infection. ? Your sexual activity has changed since you were last screened and you are at an increased risk for chlamydia or gonorrhea. Ask your health care provider if you are at risk.  Talk with your health care provider about whether you are at high risk of being infected with HIV. Your health care provider may recommend a prescription medicine to help prevent HIV infection.  What else can I do?  Schedule regular health, dental, and  eye exams.  Stay current with your vaccines (immunizations).  Do not use any tobacco products, such as cigarettes, chewing tobacco, and e-cigarettes. If you need help quitting, ask your health care provider.  Limit alcohol intake to no more than 2 drinks per day. One drink equals 12 ounces of beer, 5 ounces of wine, or 1 ounces of hard liquor.  Do not use street drugs.  Do not share needles.  Ask your health care provider for help if you need support or information about quitting drugs.  Tell your health care provider if you often feel depressed.  Tell your health care provider if you have ever been abused or do not feel safe at home. This information is not intended to replace advice given to you by your health care provider. Make sure you discuss any questions you have with your health care provider. Document Released: 06/05/2008 Document Revised: 08/06/2016 Document Reviewed: 09/11/2015  Chartered certified accountant Patient Education  2018 [place] you for coming in today. I hope you feel we met your needs.  Feel free to call PCP if you have any questions or further requests.  Please consider signing up for MyChart if you do not already have it, as this is a great way to communicate with me.  Best,  Whitney McVey, PA-C   IF you received an x-ray today, you will receive an invoice from [facility] Radiology. Please contact [facility]iology at [phone] with questions or concerns regarding your invoice.   IF you received labwork today, you will receive an invoice from [place]. Please contact [facility] at [phone] with questions or concerns regarding your invoice.   Our billing staff will not be able to assist you with questions regarding bills from these companies.  You will be contacted with the lab results as soon as they are available. The fastest way to get your results is to activate your My Chart account. Instructions are located on the last page of this  paperwork. If you have not heard from [place] regarding the results in 2 weeks, please contact this office.

## 2018-05-28 NOTE — Progress Notes (Signed)
[facility] at [place], [place] [address] [phone]  Date:  05/28/2018   Name:  Alexander [name]   DOB:  [birth year]   MRN:  [id number]  PCP:  Dorise Hiss, PA-C    Chief Complaint: Annual Exam (overdue health maint for pt Colonoscopy, Pneumonia 23, HIV screening) and Medication Refill (Lotrimin cream)   History of Present Illness:  This is a 52 y.o. male with PMH HTN, diabetes Type 2, erectile dysfunction, and hyperlipidemia who is presenting for CPE.  Diabetes - Metformin 1000 mg bid, Pioglitazone 15 mg daily, glipizide 20 mg bid. He is taking Metformin only. He has had a difficult time getting refills on medications. Has been out of Pioglitazone and glipizide for several months.  He checks sugars 1-2 times a week.  He has an appointment with endocrinologist August 26.  He is trying to lose weight. He is down to 216l bs today, this is down from 240's 4 years ago. He is eating healthy diet and walking 2-3 miles every day.   Hyperlipidemia - Simvastatin 40 mg.   Erectile Dysfunction - Controlled with Viagra. would like refills.   Complaints:  Rash on penis. This is a recurring problem for him.  Immunizations: needs pneumococcal  Dentist: regular appointment  Eye: wears glasses. 20/20 b/l  Diet: eats a low fat, low carb diet.  Exercise: walks 2 miles daily.  Fam hx: family history includes Diabetes in his father; Heart attack (age of onset: 53) in his father. Sexual hx: married x 29 years Urinary hesitancy/frequency or nocturia: wakes up once to urinate.  Tobacco/alcohol/substance use: none   Colonoscopy: he had one at 52 years old. Next imaging in 2 years.    Review of Systems:  Review of Systems  Constitutional: Negative for activity change, appetite change and fatigue.  HENT: Negative for congestion, dental problem, sneezing and tinnitus.   Eyes: Negative for visual disturbance.  Respiratory: Negative for cough, chest tightness, shortness of  breath and wheezing.   Cardiovascular: Negative for chest pain, palpitations and leg swelling.  Gastrointestinal: Negative for abdominal pain, blood in stool, constipation, diarrhea, nausea and vomiting.  Endocrine: Negative for polydipsia, polyphagia and polyuria.  Genitourinary: Negative for decreased urine volume, difficulty urinating, discharge, hematuria, scrotal swelling and testicular pain.  Musculoskeletal: Negative for arthralgias, back pain and neck stiffness.  Skin: Positive for rash (on penis).  Allergic/Immunologic: Negative for environmental allergies and food allergies.  Neurological: Negative for dizziness, syncope, weakness, light-headedness and headaches.  Psychiatric/Behavioral: Negative for sleep disturbance. The patient is not nervous/anxious.     Patient Active Problem List   Diagnosis Date Noted  . Abscess of left thigh 11/23/2017  . Abnormal finding on MRI of brain 04/21/2014  . Impotence of organic origin 09/24/2013  . HTN (hypertension) 12/17/2012  . Diabetes mellitus, type 2 ([place]) 12/17/2012  . Hyperlipidemia 12/17/2012    Prior to Admission medications   Medication Sig Start Date End Date Taking? Authorizing Provider  aspirin 81 MG tablet Take 81 mg by mouth every other day.   Yes [provider]  clotrimazole (LOTRIMIN) 1 % cream Apply 1 application topically 2 (two) times daily. 10/21/16  Yes Lashaya Kienitz, Gelene Mink, PA-C  Continuous Blood Gluc Sensor MISC 1 each by Does not apply route as directed. Use as directed every 10 days. May dispense FreeStyle [employer] or similar. 11/25/17  Yes Focht, Jessica L, PA  glipiZIDE (GLUCOTROL) 10 MG tablet TAKE 2 TABLETS BY MOUTH 2  TIMES DAILY BEFORE MEALS 12/08/17  Yes Lariah Fleer, Gelene Mink, PA-C  losartan (COZAAR) 100 MG tablet TAKE 1 TABLET BY MOUTH  DAILY 03/03/18  Yes Aleisha Paone, Gelene Mink, PA-C  metFORMIN (GLUCOPHAGE) 1000 MG tablet TAKE 1 TABLET BY MOUTH  TWICE A DAY WITH MEALS 05/25/18  Yes  Kobi Mario, Gelene Mink, PA-C  Multiple Vitamin (MULTIVITAMIN) tablet Take 1 tablet by mouth daily.   Yes [provider]  sildenafil (VIAGRA) 100 MG tablet TAKE 1 TABLET BY MOUTH AS NEEDED FOR ERECTILE DYSFUNCTION 02/19/18  Yes Caryssa Elzey, Gelene Mink, PA-C  simvastatin (ZOCOR) 40 MG tablet TAKE 1 TABLET BY MOUTH DAILY NEED APPOINTMENT FOR REFILLS 02/06/18  Yes Nayeliz Hipp, Gelene Mink, PA-C  oxyCODONE-acetaminophen (PERCOCET/ROXICET) 5-325 MG tablet Take 1 tablet by mouth every 4 (four) hours as needed for moderate pain. Patient not taking: Reported on 05/28/2018 11/25/17   Jackson Latino L, PA  pioglitazone (ACTOS) 15 MG tablet Take 1 tablet (15 mg total) by mouth daily. Patient not taking: Reported on 05/28/2018 11/25/17   Kalman Drape, PA    No Known Allergies  Past Surgical History:  Procedure Laterality Date  . I&D EXTREMITY Left 11/24/2017   Procedure: IRRIGATION AND DEBRIDEMENT LEFT THIGH ABSCESS;  Surgeon: Erroll Luna, MD;  Location: [facility];  Service: General;  Laterality: Left;  . ORIF HIP FRACTURE Right     Social History   Tobacco Use  . Smoking status: Never Smoker  . Smokeless tobacco: Never Used  Substance Use Topics  . Alcohol use: Yes    Alcohol/week: 1.2 oz    Types: 2 Standard drinks or equivalent per week    Comment: social   . Drug use: No    Family History  Problem Relation Age of Onset  . Diabetes Father   . Heart attack Father 6       Died age 46 MI    Medication list has been reviewed and updated.  Physical Examination:  Physical Exam  Constitutional: He is oriented to person, place, and time. No distress.  HENT:  Head: Normocephalic and atraumatic.  Right Ear: External ear normal.  Left Ear: External ear normal.  Nose: Nose normal.  Mouth/Throat: Oropharynx is clear and moist. No oropharyngeal exudate.  Eyes: Pupils are equal, round, and reactive to light. Conjunctivae and EOM are normal. Right eye exhibits no discharge. No  scleral icterus.  Neck: Normal range of motion. Neck supple. No tracheal deviation present. No thyromegaly present.  Cardiovascular: Normal rate, regular rhythm and intact distal pulses.  No murmur heard. Pulmonary/Chest: Effort normal and breath sounds normal. No respiratory distress. He has no wheezes.  Abdominal: Soft. Bowel sounds are normal. He exhibits no distension and no mass. There is no tenderness.  Genitourinary: No discharge found.  Genitourinary Comments: Erythema with some desquamation and smegma corona.   Musculoskeletal: Normal range of motion.  Lymphadenopathy:    He has no cervical adenopathy.  Neurological: He is alert and oriented to person, place, and time. He has normal reflexes.  Skin: Skin is warm and dry. He is not diaphoretic.  Psychiatric: Judgment normal.    BP 128/86 (BP Location: Left Arm, Patient Position: Sitting, Cuff Size: Normal)   Pulse 98   Temp 97.7 F (36.5 C) (Oral)   Resp 16   Ht 5' 8.5" (1.74 m)   Wt 216 lb 6.4 oz (98.2 kg)   SpO2 96%   BMI 32.42 kg/m   Assessment and Plan: 1. Annual physical exam - Pt presents for annual  exam. UTD colonoscopy. Next imaging in 2 years. Routine labs are pending. Will contact with results.  Anticipatory guidance provided. con't regular exercise.   2. Essential hypertension - Controlled. Blood pressure today is 128/86. Ok to refill medications at this dose.  - aspirin 81 MG tablet; Take 1 tablet (81 mg total) by mouth every other day.  Dispense: 30 tablet; Refill: 11 - losartan (COZAAR) 100 MG tablet; Take 1 tablet (100 mg total) by mouth daily.  Dispense: 90 tablet; Refill: 3  3. Type 2 diabetes mellitus without complication, without long-term current use of insulin (HCC) - Uncontrolled x several years. Pt has been out of glucotrol and Actos for several months. He was unaware he was supposed to RTC for f/u. A1C today is 14. He has an appointment with endocrinologist August 26.  - POCT glycosylated  hemoglobin (Hb A1C) - CMP14+EGFR - POCT urinalysis dipstick - Microalbumin, urine - glipiZIDE (GLUCOTROL) 10 MG tablet; TAKE 2 TABLETS BY MOUTH 2  TIMES DAILY BEFORE MEALS  Dispense: 30 tablet; Refill: 3 - metFORMIN (GLUCOPHAGE) 1000 MG tablet; Take 1 tablet (1,000 mg total) by mouth 2 (two) times daily with a meal.  Dispense: 120 tablet; Refill: 0 - pioglitazone (ACTOS) 15 MG tablet; Take 1 tablet (15 mg total) by mouth daily.  Dispense: 30 tablet; Refill: 3  4. Hyperlipidemia, unspecified hyperlipidemia type - Lipid panel - simvastatin (ZOCOR) 40 MG tablet; Take 1 tablet (40 mg total) by mouth daily.  Dispense: 30 tablet; Refill: 11  5. Screen for STD (sexually transmitted disease) - pt is asymptomatic. Routine screening.  - Chlamydia/Gonococcus/Trichomonas, NAA  6. Erectile dysfunction, unspecified erectile dysfunction type - sildenafil (VIAGRA) 100 MG tablet; TAKE 1 TABLET BY MOUTH AS NEEDED FOR ERECTILE DYSFUNCTION  Dispense: 10 tablet; Refill: 2  7. Balanitis - fluconazole (DIFLUCAN) 150 MG tablet; Take 1 tablet (150 mg total) by mouth once for 1 dose. Repeat if needed  Dispense: 2 tablet; Refill: 0 - clotrimazole (LOTRIMIN) 1 % cream; Apply 1 application topically 2 (two) times daily.  Dispense: 30 g; Refill: 0  Whitney Janira Mandell, PA-C  [facility] 05/28/2018 11:22 AM

## 2018-05-29 LAB — MICROALBUMIN, URINE: Microalbumin, Urine: 159 ug/mL

## 2018-05-31 LAB — CHLAMYDIA/GONOCOCCUS/TRICHOMONAS, NAA
Chlamydia by NAA: NEGATIVE
Gonococcus by NAA: NEGATIVE
Trich vag by NAA: NEGATIVE

## 2018-06-01 ENCOUNTER — Encounter: Payer: Self-pay | Admitting: Physician Assistant

## 2018-06-03 ENCOUNTER — Other Ambulatory Visit: Payer: Self-pay | Admitting: Physician Assistant

## 2018-06-03 DIAGNOSIS — I10 Essential (primary) hypertension: Secondary | ICD-10-CM

## 2018-06-03 DIAGNOSIS — N529 Male erectile dysfunction, unspecified: Secondary | ICD-10-CM

## 2018-06-03 NOTE — Telephone Encounter (Signed)
Refilled 05/28/18. Pt states he received both rx.

## 2018-06-03 NOTE — Telephone Encounter (Signed)
NT called pt in regards to RX request, issue resolved.  During call pt stated he was waiting to hear back on lab results of 05/28/18.    Not released to [facility].    CB #  [phone]

## 2018-06-04 NOTE — Telephone Encounter (Signed)
Pt needs lab results released.  Pt has received med refills

## 2018-06-10 ENCOUNTER — Encounter: Payer: Self-pay | Admitting: Physician Assistant

## 2018-06-10 NOTE — Progress Notes (Signed)
Letter sent in mail with lab results. You are negative for gonorrhea, chlamydia, and trichomonas. Your cholesterol, kidneys, liver, salts in the blood, blood counts all came back normal.

## 2018-06-17 ENCOUNTER — Other Ambulatory Visit: Payer: Self-pay | Admitting: Physician Assistant

## 2018-06-18 ENCOUNTER — Other Ambulatory Visit: Payer: Self-pay | Admitting: Physician Assistant

## 2018-06-18 ENCOUNTER — Other Ambulatory Visit: Payer: Self-pay | Admitting: *Deleted

## 2018-06-18 DIAGNOSIS — I10 Essential (primary) hypertension: Secondary | ICD-10-CM

## 2018-06-18 DIAGNOSIS — N529 Male erectile dysfunction, unspecified: Secondary | ICD-10-CM

## 2018-06-18 NOTE — Telephone Encounter (Signed)
Refill of viagra  LRF 05/28/18   #10  2 refills  LOV 05/28/18  W. McVey

## 2018-06-18 NOTE — Telephone Encounter (Signed)
Copied from [place] [phone]. Topic: Quick Communication - Rx Refill/Question >> Jun 18, 2018  4:54 PM Jarold Motto, Fraser Din wrote: Medication: simvastatin (ZOCOR) 40 MG tablet   Has the patient contacted their pharmacy? no Preferred Pharmacy (with phone number or street name): [id number] [place], [place] [phone] (Phone) [phone] (Fax)  Agent: Please be advised that RX refills may take up to 3 business days. We ask that you follow-up with your pharmacy.

## 2018-06-18 NOTE — Telephone Encounter (Signed)
Interface request refill for Zecor    LOV 6/719 with McVey   Last refill 02/06/18   Pharmacy [facility]

## 2018-06-19 NOTE — Telephone Encounter (Signed)
Medication filled on 06/18/18

## 2018-06-21 MED ORDER — PIOGLITAZONE HCL 15 MG PO TABS: 15.0000 mg | ORAL_TABLET | Freq: Every day | ORAL | 3 refills | Status: DC

## 2018-06-21 MED ORDER — METFORMIN HCL 1000 MG PO TABS: 1000.0000 mg | ORAL_TABLET | Freq: Two times a day (BID) | ORAL | 0 refills | Status: DC

## 2018-06-21 MED ORDER — SIMVASTATIN 40 MG PO TABS: 40.0000 mg | ORAL_TABLET | Freq: Every day | ORAL | 11 refills | Status: DC

## 2018-06-21 MED ORDER — GLIPIZIDE 10 MG PO TABS: ORAL_TABLET | ORAL | 3 refills | Status: DC

## 2018-06-21 MED ORDER — LOSARTAN POTASSIUM 100 MG PO TABS: 100.0000 mg | ORAL_TABLET | Freq: Every day | ORAL | 3 refills | Status: DC

## 2018-07-02 ENCOUNTER — Other Ambulatory Visit: Payer: Self-pay | Admitting: Physician Assistant

## 2018-07-02 DIAGNOSIS — N481 Balanitis: Secondary | ICD-10-CM

## 2018-07-22 ENCOUNTER — Other Ambulatory Visit: Payer: Self-pay | Admitting: Physician Assistant

## 2018-07-22 DIAGNOSIS — E119 Type 2 diabetes mellitus without complications: Secondary | ICD-10-CM

## 2018-07-22 DIAGNOSIS — E785 Hyperlipidemia, unspecified: Secondary | ICD-10-CM

## 2018-07-22 NOTE — Telephone Encounter (Signed)
Metformin refill Last Refill:06/21/18 # 120 Last OV: 05/28/18 PCP: Loree Fee McVey PA Pharmacy:[facility]  Last Hgb A1C:05/28/18 : 14.0

## 2018-08-03 ENCOUNTER — Other Ambulatory Visit: Payer: Self-pay | Admitting: Physician Assistant

## 2018-08-03 DIAGNOSIS — N529 Male erectile dysfunction, unspecified: Secondary | ICD-10-CM

## 2018-08-03 NOTE — Progress Notes (Unsigned)
This encounter was created in error - please disregard.

## 2018-08-11 ENCOUNTER — Telehealth: Payer: Self-pay | Admitting: Physician Assistant

## 2018-08-11 NOTE — Telephone Encounter (Signed)
Please advise. Dgaddy, CMA 

## 2018-08-11 NOTE — Telephone Encounter (Signed)
Copied from [place] [id number]. Topic: Quick Communication - See Telephone Encounter >> Aug 11, 2018 12:04 PM Synthia Innocent wrote: CRM for notification. See Telephone encounter for: 08/11/18. Per [facility], insurance will only pay for 4 tabs a month of sildenafil (VIAGRA) 100 MG tablet, requesting 90 day supply. [facility]# [phone]. Patient requesting call once resent

## 2018-08-16 ENCOUNTER — Encounter: Payer: Self-pay | Admitting: Internal Medicine

## 2018-08-16 ENCOUNTER — Ambulatory Visit (INDEPENDENT_AMBULATORY_CARE_PROVIDER_SITE_OTHER): Payer: 59 | Admitting: Internal Medicine

## 2018-08-16 ENCOUNTER — Ambulatory Visit: Payer: 59 | Admitting: Internal Medicine

## 2018-08-16 ENCOUNTER — Other Ambulatory Visit: Payer: Self-pay | Admitting: Physician Assistant

## 2018-08-16 VITALS — BP 140/90 | HR 92 | Ht 68.5 in | Wt 233.8 lb

## 2018-08-16 DIAGNOSIS — IMO0002 Reserved for concepts with insufficient information to code with codable children: Secondary | ICD-10-CM

## 2018-08-16 DIAGNOSIS — E1142 Type 2 diabetes mellitus with diabetic polyneuropathy: Secondary | ICD-10-CM | POA: Diagnosis not present

## 2018-08-16 DIAGNOSIS — N529 Male erectile dysfunction, unspecified: Secondary | ICD-10-CM

## 2018-08-16 DIAGNOSIS — E1165 Type 2 diabetes mellitus with hyperglycemia: Secondary | ICD-10-CM | POA: Insufficient documentation

## 2018-08-16 DIAGNOSIS — Z794 Long term (current) use of insulin: Secondary | ICD-10-CM | POA: Insufficient documentation

## 2018-08-16 LAB — POCT GLYCOSYLATED HEMOGLOBIN (HGB A1C): HEMOGLOBIN A1C: 11.9 % — AB (ref 4.0–5.6)

## 2018-08-16 MED ORDER — SILDENAFIL CITRATE 100 MG PO TABS: ORAL_TABLET | ORAL | 3 refills | Status: DC

## 2018-08-16 MED ORDER — SEMAGLUTIDE(0.25 OR 0.5MG/DOS) 2 MG/1.5ML ~~LOC~~ SOPN: 0.5000 mg | PEN_INJECTOR | SUBCUTANEOUS | 5 refills | Status: DC

## 2018-08-16 NOTE — Addendum Note (Signed)
Addended by: Drucilla Schmidt on: 08/16/2018 01:20 PM   Modules accepted: Orders

## 2018-08-16 NOTE — Telephone Encounter (Signed)
Rx sent #4 tabs

## 2018-08-16 NOTE — Patient Instructions (Addendum)
Please continue: - Metformin 1000 mg 2x a day, with meals - Actos 15 mg daily - Glipizide 10 mg 2x a day before meals  Please start Ozempic 0.25 mg weekly in a.m. (for example on Sunday morning) x 4 weeks, then increase to 0.5 mg weekly in a.m. if no nausea or hypoglycemia.  Please return in 3 months with your sugar log.   PATIENT INSTRUCTIONS FOR TYPE 2 DIABETES:  **Please join MyChart!** - see attached instructions about how to join if you have not done so already.  DIET AND EXERCISE Diet and exercise is an important part of diabetic treatment.  We recommended aerobic exercise in the form of brisk walking (working between 40-60% of maximal aerobic capacity, similar to brisk walking) for 150 minutes per week (such as 30 minutes five days per week) along with 3 times per week performing 'resistance' training (using various gauge rubber tubes with handles) 5-10 exercises involving the major muscle groups (upper body, lower body and core) performing 10-15 repetitions (or near fatigue) each exercise. Start at half the above goal but build slowly to reach the above goals. If limited by weight, joint pain, or disability, we recommend daily walking in a swimming pool with water up to waist to reduce pressure from joints while allow for adequate exercise.    BLOOD GLUCOSES Monitoring your blood glucoses is important for continued management of your diabetes. Please check your blood glucoses 2-4 times a day: fasting, before meals and at bedtime (you can rotate these measurements - e.g. one day check before the 3 meals, the next day check before 2 of the meals and before bedtime, etc.).   HYPOGLYCEMIA (low blood sugar) Hypoglycemia is usually a reaction to not eating, exercising, or taking too much insulin/ other diabetes drugs.  Symptoms include tremors, sweating, hunger, confusion, headache, etc. Treat IMMEDIATELY with 15 grams of Carbs: . 4 glucose tablets .  cup regular juice/soda . 2  tablespoons raisins . 4 teaspoons sugar . 1 tablespoon honey Recheck blood glucose in 15 mins and repeat above if still symptomatic/blood glucose <100.  RECOMMENDATIONS TO REDUCE YOUR RISK OF DIABETIC COMPLICATIONS: * Take your prescribed MEDICATION(S) * Follow a DIABETIC diet: Complex carbs, fiber rich foods, (monounsaturated and polyunsaturated) fats * AVOID saturated/trans fats, high fat foods, >2,300 mg salt per day. * EXERCISE at least 5 times a week for 30 minutes or preferably daily.  * DO NOT SMOKE OR DRINK more than 1 drink a day. * Check your FEET every day. Do not wear tightfitting shoes. Contact us if you develop an ulcer * See your EYE doctor once a year or more if needed * Get a FLU shot once a year * Get a PNEUMONIA vaccine once before and once after age 29 years  GOALS:  * Your Hemoglobin A1c of <7%  * fasting sugars need to be <130 * after meals sugars need to be <180 (2h after you start eating) * Your Systolic BP should be 829 or lower  * Your Diastolic BP should be 80 or lower  * Your HDL (Good Cholesterol) should be 40 or higher  * Your LDL (Bad Cholesterol) should be 100 or lower. * Your Triglycerides should be 150 or lower  * Your Urine microalbumin (kidney function) should be <30 * Your Body Mass Index should be 25 or lower    Please consider the following ways to cut down carbs and fat and increase fiber and micronutrients in your diet: - substitute whole grain  for white bread or pasta - substitute brown rice for white rice - substitute 90-calorie flat bread pieces for slices of bread when possible - substitute sweet potatoes or yams for white potatoes - substitute humus for margarine - substitute tofu for cheese when possible - substitute almond or rice milk for regular milk (would not drink soy milk daily due to concern for soy estrogen influence on breast cancer risk) - substitute dark chocolate for other sweets when possible - substitute water - can  add lemon or orange slices for taste - for diet sodas (artificial sweeteners will trick your body that you can eat sweets without getting calories and will lead you to overeating and weight gain in the long run) - do not skip breakfast or other meals (this will slow down the metabolism and will result in more weight gain over time)  - can try smoothies made from fruit and almond/rice milk in am instead of regular breakfast - can also try old-fashioned (not instant) oatmeal made with almond/rice milk in am - order the dressing on the side when eating salad at a restaurant (pour less than half of the dressing on the salad) - eat as little meat as possible - can try juicing, but should not forget that juicing will get rid of the fiber, so would alternate with eating raw veg./fruits or drinking smoothies - use as little oil as possible, even when using olive oil - can dress a salad with a mix of balsamic vinegar and lemon juice, for e.g. - use agave nectar, stevia sugar, or regular sugar rather than artificial sweateners - steam or broil/roast veggies  - snack on veggies/fruit/nuts (unsalted, preferably) when possible, rather than processed foods - reduce or eliminate aspartame in diet (it is in diet sodas, chewing gum, etc) Read the labels!  Try to read Dr. Janene Harvey book: "Program for Reversing Diabetes" for other ideas for healthy eating.

## 2018-08-16 NOTE — Progress Notes (Signed)
Patient ID: Alexander [name], male   DOB: 07/17/1966, 52 y.o.   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 Loanne Drilling for this in 2014).  Last hemoglobin A1c was: Lab Results  Component Value Date   HGBA1C 14.0 (A) 05/28/2018   HGBA1C 12.1 (H) 11/23/2017   HGBA1C 11.9 10/30/2016   HGBA1C 11.2 (H) 10/21/2016   HGBA1C 11.4 03/08/2016   HGBA1C 13.6 07/22/2015   HGBA1C 11.1 12/23/2014   HGBA1C 10.1 08/21/2014   HGBA1C 11.2 03/24/2014   HGBA1C 11.2 09/17/2013   Pt is on a regimen of: - Metformin 1000 mg 2x a day, with meals - Actos 15 mg daily - Glipizide 10 mg 2x a day before meals - but was off for a long time He drives a truck.  Freestyle Libre CGM  - has one, but not on him now, has to change it.  Pt checks his sugars 1x a day and they are: - am: 200-250 - 2h after b'fast: n/c - before lunch: n/c - 2h after lunch: n/c - before dinner: n/c - 2h after dinner: 200s - bedtime: n/c - nighttime: n/c No lows. Lowest sugar was 150, not recently;? hypoglycemia awareness.  Highest sugar was 280.  Glucometer: ReliOn  Pt's meals are: - Breakfast: 2 eggs, 2 bacon pieces, OJ - Lunch: fast food, grilled fish + green beans, cabbage, corn, no bread, unsweet tea - Dinner: cracker-barell: grilled fish or chicken + turnip greens - Snacks: fruit, nuts  - no CKD, last BUN/creatinine:  Lab Results  Component Value Date   BUN 13 05/28/2018   BUN 10 11/23/2017   CREATININE 0.85 05/28/2018   CREATININE 0.75 11/23/2017  On Cozaar 100.  - + HL; last set of lipids: Lab Results  Component Value Date   CHOL 176 05/28/2018   HDL 63 05/28/2018   LDLCALC 92 05/28/2018   TRIG 104 05/28/2018   CHOLHDL 2.8 05/28/2018  On Zocor 40.  - last eye exam was in 2019. ? No DR.    - no numbness and tingling in his feet.  On ASA 81.  ROS: Constitutional: no weight gain/loss, no fatigue, no subjective hyperthermia/hypothermia Eyes: no blurry vision, no xerophthalmia ENT: no sore throat, no nodules palpated in throat, no dysphagia/odynophagia, no hoarseness Cardiovascular: no CP/SOB/palpitations/leg swelling Respiratory: no cough/SOB Gastrointestinal: no N/V/D/C Musculoskeletal: no muscle/joint aches Skin: no rashes Neurological: no tremors/numbness/tingling/dizziness Psychiatric: no depression/anxiety  Past Medical History:  Diagnosis Date  . Diabetes mellitus without complication ([place])   . Hyperlipidemia   . Hypertension    Past Surgical History:  Procedure Laterality Date  . I&D EXTREMITY Left 11/24/2017   Procedure: IRRIGATION AND DEBRIDEMENT LEFT THIGH ABSCESS;  Surgeon: Erroll Luna, MD;  Location: [place];  Service: General;  Laterality: Left;  . ORIF HIP FRACTURE Right    Social History   Socioeconomic History  . Marital status: Married    Spouse name: Thayer Headings  . Number of children: 1  . Years of education: college  . Highest education level: Not on file  Occupational History  . Occupation: Self employed  Social Needs  . Financial resource strain: Not on file  . Food insecurity:    Worry: Not on file    Inability: Not on file  . Transportation needs:  Medical: Not on file    Non-medical: Not on file  Tobacco Use  . Smoking status: Never Smoker  . Smokeless tobacco: Never Used  Substance and Sexual Activity  . Alcohol use: Yes    Alcohol/week: 2.0 standard drinks    Types: 2 Standard drinks or equivalent per week    Comment: social   . Drug use: No  . Sexual activity: Yes    Partners: Female  Lifestyle  . Physical activity:    Days per week: Not on file    Minutes per session: Not on file  . Stress: Not on file  Relationships  . Social connections:    Talks on phone: Not on file    Gets together: Not on file    Attends  religious service: Not on file    Active member of club or organization: Not on file    Attends meetings of clubs or organizations: Not on file    Relationship status: Not on file  . Intimate partner violence:    Fear of current or ex partner: Not on file    Emotionally abused: Not on file    Physically abused: Not on file    Forced sexual activity: Not on file  Other Topics Concern  . Not on file  Social History Narrative   Lives with wife.     Current Outpatient Medications on File Prior to Visit  Medication Sig Dispense Refill  . aspirin 81 MG tablet Take 1 tablet (81 mg total) by mouth every other day. 30 tablet 11  . clotrimazole (LOTRIMIN) 1 % cream APPLY TO AFFECTED AREA TWICE A DAY *NOT COVERED* 30 g 0  . Continuous Blood Gluc Sensor MISC 1 each by Does not apply route as directed. Use as directed every 10 days. May dispense FreeStyle [employer] or similar. 3 each 0  . glipiZIDE (GLUCOTROL) 10 MG tablet TAKE 2 TABLETS BY MOUTH 2  TIMES DAILY BEFORE MEALS 30 tablet 3  . losartan (COZAAR) 100 MG tablet Take 1 tablet (100 mg total) by mouth daily. 90 tablet 3  . metFORMIN (GLUCOPHAGE) 1000 MG tablet TAKE 1 TABLET BY MOUTH TWO  TIMES DAILY WITH MEALS 120 tablet 2  . Multiple Vitamin (MULTIVITAMIN) tablet Take 1 tablet by mouth daily.    . pioglitazone (ACTOS) 15 MG tablet Take 1 tablet (15 mg total) by mouth daily. 30 tablet 3  . sildenafil (VIAGRA) 100 MG tablet TAKE 1 TABLET BY MOUTH AS NEEDED FOR ERECTILE DYSFUNCTION 10 tablet 2  . sildenafil (VIAGRA) 100 MG tablet TAKE 1 TABLET BY MOUTH AS  NEEDED FOR ERECTILE  DYSFUNCTION AS DIRECTED 6 tablet 0  . simvastatin (ZOCOR) 40 MG tablet Take 1 tablet (40 mg total) by mouth daily. 30 tablet 11  . simvastatin (ZOCOR) 40 MG tablet TAKE 1 TABLET BY MOUTH DAILY NEED APPOINTMENT FOR REFILLS 30 tablet 0   No current facility-administered medications on file prior to visit.    No Known Allergies Family History  Problem Relation  Age of Onset  . Diabetes Father   . Heart attack Father 67       Died age 11 MI    PE: BP 140/90   Pulse 92   Ht 5' 8.5" (1.74 m)   Wt 233 lb 12.8 oz (106.1 kg)   SpO2 96%   BMI 35.03 kg/m  Wt Readings from Last 3 Encounters:  08/16/18 233 lb 12.8 oz (106.1 kg)  05/28/18 216 lb 6.4 oz (98.2  kg)  11/27/17 226 lb 3.2 oz (102.6 kg)   Constitutional: overweight, in NAD Eyes: PERRLA, EOMI, no exophthalmos ENT: moist mucous membranes, no thyromegaly, no cervical lymphadenopathy Cardiovascular: tachycardia, RR, No MRG Respiratory: CTA B Gastrointestinal: abdomen soft, NT, ND, BS+ Musculoskeletal: no deformities, strength intact in all 4 Skin: moist, warm, no rashes Neurological: no tremor with outstretched hands, DTR normal in all 4  ASSESSMENT: 1. DM2, non-insulin-dependent, uncontrolled, with long-term complications - PN - ED  PLAN:  1. Patient with long-standing, uncontrolled diabetes, on oral antidiabetic regimen, which became insufficient. At this visit, HbA1c was 11.9% (improved, but still very high). 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 Diet is: eggs and bacon in the am and meat + veggies later. He eats out most meals. - we discussed about the concept of insulin resistance and discussed strategies to improve his insulin sensitivity. I recommended a low fat diet, mostly plant-based, and given references. He reluctantly agrees with this. - also, we will start a GLP1 R agonist at low dose and advance if tolerated. We discussed about benefits and possible SEs  - I suggested to:  Patient Instructions  Please continue: - Metformin 1000 mg 2x a day, with meals - Actos 15 mg daily - Glipizide 10 mg 2x a day before meals  Please start Ozempic 0.25 mg weekly in a.m. (for example on Sunday morning) x 4 weeks, then increase to 0.5 mg weekly in a.m. if no nausea or hypoglycemia.  Please return in 3 months with your  sugar log.   - Strongly advised him to start checking sugars at different times of the day - check 1-2x a day, rotating checks - given sugar log and advised how to fill it and to bring it at next appt  - given foot care handout and explained the principles  - given instructions for hypoglycemia management "15-15 rule"  - advised for yearly eye exams  - Return to clinic in 3 mo with sugar log   Philemon Kingdom, MD PhD [facility] Endocrinology

## 2018-09-30 ENCOUNTER — Other Ambulatory Visit: Payer: Self-pay | Admitting: Physician Assistant

## 2018-09-30 DIAGNOSIS — E119 Type 2 diabetes mellitus without complications: Secondary | ICD-10-CM

## 2018-09-30 NOTE — Telephone Encounter (Signed)
Requested medication (s) are due for refill today:   Yes for all 3 medications.   Requested medication (s) are on the active medication list:   Yes for all 3 medications  Future visit scheduled:   No   Sees McVey   Last ordered: Viagra 08/16/18  #4 with 3 refills                         Actos 06/21/18  #30 with 3 refills                           Glucotrol 06/21/18  #30 with 3 refills   Requested Prescriptions  Pending Prescriptions Disp Refills   sildenafil (VIAGRA) 100 MG tablet [Pharmacy Med Name: SILDENAFIL  100MG   TAB] 3 tablet     Sig: TAKE 1 TABLET BY MOUTH AS  NEEDED FOR ERECTILE  DYSFUNCTION AS DIRECTED     Urology: Erectile Dysfunction Agents Failed - 09/30/2018  1:40 PM      Failed - Last BP in normal range    BP Readings from Last 1 Encounters:  08/16/18 140/90         Passed - Valid encounter within last 12 months    Recent Outpatient Visits          4 months ago Annual physical exam   Primary Care at [facility], Gelene Mink, PA-C   10 months ago Hospital discharge follow-up   Primary Care at [facility], Gelene Mink, PA-C   1 year ago 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 [facility] [place], PA-C   1 year ago Type 2 diabetes mellitus without complication, without long-term current use of insulin ([place])   Primary Care at [facility], Gelene Mink, PA-C   1 year ago Encounter for medication management   Primary Care at [facility], [employer], PA-C            pioglitazone (ACTOS) 15 MG tablet [Pharmacy Med Name: PIOGLITAZONE  15MG   TAB] 30 tablet 3    Sig: TAKE 1 TABLET BY MOUTH  DAILY     Endocrinology:  Diabetes - Glitazones - pioglitazone Failed - 09/30/2018  1:40 PM      Failed - HBA1C is between 0 and 7.9 and within 180 days    Hemoglobin A1C  Date Value Ref Range Status  08/16/2018 11.9 (A) 4.0 - 5.6 % Final   Hgb A1c MFr Bld  Date Value Ref Range Status   11/23/2017 12.1 (H) 4.8 - 5.6 % Final    Comment:    (NOTE) Pre diabetes:          5.7%-6.4% Diabetes:              >6.4% Glycemic control for   <7.0% adults with diabetes          Passed - Valid encounter within last 6 months    Recent Outpatient Visits          4 months ago Annual physical exam   Primary Care at [facility], Gelene Mink, PA-C   10 months ago Hospital discharge follow-up   Primary Care at [facility], Gelene Mink, PA-C   1 year ago 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 [facility], PA-C   1 year ago Type 2 diabetes mellitus without complication, without  long-term current use of insulin ([place])   Primary Care at [facility], Gelene Mink, PA-C   1 year ago Encounter for medication management   Primary Care at [facility], Gelene Mink, PA-C            glipiZIDE ([place]) 10 MG tablet [Pharmacy Med Name: GLIPIZIDE  10MG   TAB] 360 tablet     Sig: TAKE 2 TABLETS BY MOUTH [place]     Endocrinology:  Diabetes - Sulfonylureas Failed - 09/30/2018  1:40 PM      Failed - HBA1C is between 0 and 7.9 and within 180 days    Hemoglobin A1C  Date Value Ref Range Status  08/16/2018 11.9 (A) 4.0 - 5.6 % Final   Hgb A1c MFr Bld  Date Value Ref Range Status  11/23/2017 12.1 (H) 4.8 - 5.6 % Final    Comment:    (NOTE) Pre diabetes:          5.7%-6.4% Diabetes:              >6.4% Glycemic control for   <7.0% adults with diabetes          Passed - Valid encounter within last 6 months    Recent Outpatient Visits          4 months ago Annual physical exam   Primary Care at [facility], Gelene Mink, PA-C   10 months ago Hospital discharge follow-up   Primary Care at [facility], Gelene Mink, PA-C   1 year ago 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

## 2018-10-13 ENCOUNTER — Telehealth: Payer: Self-pay | Admitting: Physician Assistant

## 2018-10-13 NOTE — Telephone Encounter (Signed)
Copied from [place] [phone]. Topic: Quick Communication - Rx Refill/Question >> Oct 13, 2018 12:10 PM Judyann Munson wrote: Medication: sildenafil (VIAGRA) 100 MG tablet,pioglitazone (ACTOS) 15 MG tablet  Has the patient contacted their pharmacy? no  Preferred Pharmacy (with phone number or street name): [place], [place] [phone] (Phone) [phone] (Fax)    Agent: Please be advised that RX refills may take up to 3 business days. We ask that you follow-up with your pharmacy.

## 2018-11-29 ENCOUNTER — Ambulatory Visit: Payer: 59 | Admitting: Internal Medicine

## 2019-01-27 ENCOUNTER — Other Ambulatory Visit: Payer: Self-pay | Admitting: Physician Assistant

## 2019-01-27 DIAGNOSIS — E785 Hyperlipidemia, unspecified: Secondary | ICD-10-CM

## 2019-01-27 DIAGNOSIS — E119 Type 2 diabetes mellitus without complications: Secondary | ICD-10-CM

## 2019-01-27 NOTE — Telephone Encounter (Signed)
Requested medication (s) are due for refill today yes  Requested medication (s) are on the active medication list yes  Future visit scheduled yes  >3 months overdue for OV.  TC to patient, left VM to call and schedule appointment.  Routing to PCP for consideration.   Requested Prescriptions  Pending Prescriptions Disp Refills   metFORMIN (GLUCOPHAGE) 1000 MG tablet [Pharmacy Med Name: MetFORMIN 1000MG TABLET] 180 tablet     Sig: TAKE 1 TABLET BY MOUTH TWO  TIMES DAILY WITH MEALS     Endocrinology:  Diabetes - Biguanides Failed - 01/27/2019 12:14 PM      Failed - HBA1C is between 0 and 7.9 and within 180 days    Hemoglobin A1C  Date Value Ref Range Status  08/16/2018 11.9 (A) 4.0 - 5.6 % Final   Hgb A1c MFr Bld  Date Value Ref Range Status  11/23/2017 12.1 (H) 4.8 - 5.6 % Final    Comment:    (NOTE) Pre diabetes:          5.7%-6.4% Diabetes:              >6.4% Glycemic control for   <7.0% adults with diabetes          Failed - Valid encounter within last 6 months    Recent Outpatient Visits          8 months ago Annual physical exam   Primary Care at [facility], Gelene Mink, PA-C   1 year ago Hospital discharge follow-up   Primary Care at [facility], Gelene Mink, PA-C   2 years ago 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 [facility] at [facility], Damaris Hippo, PA-C   2 years ago Type 2 diabetes mellitus without complication, without long-term current use of insulin [facility])   Primary Care at [facility], Gelene Mink, PA-C   2 years ago Encounter for medication management   Primary Care at [facility], [place], PA-C             Passed - Cr in normal range and within 360 days    Creat  Date Value Ref Range Status  10/21/2016 0.79 0.70 - 1.33 mg/dL Final    Comment:      For patients > or = 54 years of age: The upper reference limit for Creatinine is approximately 13% higher for people  identified as African-American.      Creatinine, Ser  Date Value Ref Range Status  05/28/2018 0.85 0.76 - 1.27 mg/dL Final         Passed - eGFR in normal range and within 360 days    GFR, Est African American  Date Value Ref Range Status  10/21/2016 >89 >=60 mL/min Final   GFR calc Af Amer  Date Value Ref Range Status  05/28/2018 115 >59 mL/min/1.73 Final   GFR, Est Non African American  Date Value Ref Range Status  10/21/2016 >89 >=60 mL/min Final   GFR calc non Af Amer  Date Value Ref Range Status  05/28/2018 99 >59 mL/min/1.73 Final        pioglitazone (ACTOS) 15 MG tablet [Pharmacy Med Name: PIOGLITAZONE  15MG  TAB] 30 tablet 3    Sig: TAKE 1 TABLET BY MOUTH  DAILY     Endocrinology:  Diabetes - Glitazones - pioglitazone Failed - 01/27/2019 12:14 PM      Failed - HBA1C is between 0 and 7.9 and within 180 days  Hemoglobin A1C  Date Value Ref Range Status  08/16/2018 11.9 (A) 4.0 - 5.6 % Final   Hgb A1c MFr Bld  Date Value Ref Range Status  11/23/2017 12.1 (H) 4.8 - 5.6 % Final    Comment:    (NOTE) Pre diabetes:          5.7%-6.4% Diabetes:              >6.4% Glycemic control for   <7.0% adults with diabetes          Failed - Valid encounter within last 6 months    Recent Outpatient Visits          8 months ago Annual physical exam   Primary Care at [facility], Gelene Mink, PA-C   1 year ago Hospital discharge follow-up   Primary Care at [facility], Gelene Mink, PA-C   2 years ago 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 [facility] [employer] [facility], Damaris Hippo, PA-C   2 years ago Type 2 diabetes mellitus without complication, without long-term current use of insulin [facility])   Primary Care at [facility], Gelene Mink, PA-C   2 years ago Encounter for medication management   Primary Care at [facility], [employer], PA-C            sildenafil (VIAGRA) 100 MG tablet [Pharmacy Med  Name: SILDENAFIL  100MG  TAB] 3 tablet     Sig: TAKE 1 TABLET BY MOUTH AS  NEEDED FOR ERECTILE  DYSFUNCTION AS DIRECTED     Urology: Erectile Dysfunction Agents Failed - 01/27/2019 12:14 PM      Failed - Last BP in normal range    BP Readings from Last 1 Encounters:  08/16/18 140/90         Passed - Valid encounter within last 12 months    Recent Outpatient Visits          8 months ago Annual physical exam   Primary Care at [facility], Gelene Mink, PA-C   1 year ago Hospital discharge follow-up   Primary Care at [facility], Gelene Mink, PA-C   2 years ago 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

## 2019-02-12 ENCOUNTER — Encounter (HOSPITAL_COMMUNITY): Payer: Self-pay | Admitting: *Deleted

## 2019-02-12 ENCOUNTER — Ambulatory Visit (HOSPITAL_COMMUNITY)
Admission: EM | Admit: 2019-02-12 | Discharge: 2019-02-12 | Disposition: A | Discharge status: Home / Self Care | Payer: 59 | Attending: Family Medicine | Admitting: Family Medicine

## 2019-02-12 ENCOUNTER — Other Ambulatory Visit: Payer: Self-pay

## 2019-02-12 DIAGNOSIS — B9789 Other viral agents as the cause of diseases classified elsewhere: Secondary | ICD-10-CM | POA: Diagnosis not present

## 2019-02-12 DIAGNOSIS — J40 Bronchitis, not specified as acute or chronic: Secondary | ICD-10-CM

## 2019-02-12 DIAGNOSIS — J069 Acute upper respiratory infection, unspecified: Secondary | ICD-10-CM

## 2019-02-12 MED ORDER — BENZONATATE 100 MG PO CAPS: 100.0000 mg | ORAL_CAPSULE | Freq: Three times a day (TID) | ORAL | 0 refills | Status: DC | PRN

## 2019-02-12 MED ORDER — ALBUTEROL SULFATE HFA 108 (90 BASE) MCG/ACT IN AERS: 2.0000 | INHALATION_SPRAY | RESPIRATORY_TRACT | 1 refills | Status: DC | PRN

## 2019-02-12 MED ORDER — AMOXICILLIN 875 MG PO TABS: 875.0000 mg | ORAL_TABLET | Freq: Two times a day (BID) | ORAL | 0 refills | Status: DC

## 2019-02-12 NOTE — ED Triage Notes (Signed)
C/o cough and runny nose onset 1 week ago.

## 2019-02-12 NOTE — ED Provider Notes (Signed)
[facility]    CSN: [id number] Arrival date & time: 02/12/19  1036     History   Chief Complaint Chief Complaint  Patient presents with  . Cough    HPI Alexander [name] is a 53 y.o. male.   This is the initial visit for this 53 year old man who complains of cough and runny nose with onset 1 week ago.      Past Medical History:  Diagnosis Date  . Diabetes mellitus without complication ([place])   . Hyperlipidemia   . Hypertension     Patient Active Problem List   Diagnosis Date Noted  . Uncontrolled type 2 diabetes mellitus with peripheral neuropathy ([place]) 08/16/2018  . Abscess of left thigh 11/23/2017  . Abnormal finding on MRI of brain 04/21/2014  . Impotence of organic origin 09/24/2013  . HTN (hypertension) 12/17/2012  . Diabetes mellitus, type 2 ([place]) 12/17/2012  . Hyperlipidemia 12/17/2012    Past Surgical History:  Procedure Laterality Date  . I&D EXTREMITY Left 11/24/2017   Procedure: IRRIGATION AND DEBRIDEMENT LEFT THIGH ABSCESS;  Surgeon: Erroll Luna, MD;  Location: [facility];  Service: General;  Laterality: Left;  . ORIF HIP FRACTURE Right        Home Medications    Prior to Admission medications   Medication Sig Start Date End Date Taking? Authorizing Provider  albuterol (PROVENTIL HFA;VENTOLIN HFA) 108 (90 Base) MCG/ACT inhaler Inhale 2 puffs into the lungs every 4 (four) hours as needed for wheezing or shortness of breath (cough, shortness of breath or wheezing.). 02/12/19   Robyn Haber, MD  amoxicillin (AMOXIL) 875 MG tablet Take 1 tablet (875 mg total) by mouth 2 (two) times daily. 02/12/19   Robyn Haber, MD  aspirin 81 MG tablet Take 1 tablet (81 mg total) by mouth every other day. 05/28/18   McVey, Gelene Mink, PA-C  benzonatate (TESSALON) 100 MG capsule Take 1-2 capsules (100-200 mg total) by mouth 3 (three) times daily as needed for cough. 02/12/19   Robyn Haber, MD  clotrimazole (LOTRIMIN) 1 % cream APPLY TO AFFECTED AREA  TWICE A DAY *NOT COVERED* 07/05/18   McVey, Gelene Mink, PA-C  Continuous Blood Gluc Sensor MISC 1 each by Does not apply route as directed. Use as directed every 10 days. May dispense FreeStyle [employer] or similar. 11/25/17   Focht, Fraser Din, PA  glipiZIDE (GLUCOTROL) 10 MG tablet TAKE 2 TABLETS BY MOUTH 2  TIMES DAILY BEFORE MEALS 10/13/18   McVey, Gelene Mink, PA-C  losartan (COZAAR) 100 MG tablet Take 1 tablet (100 mg total) by mouth daily. 06/21/18   McVey, Gelene Mink, PA-C  metFORMIN (GLUCOPHAGE) 1000 MG tablet TAKE 1 TABLET BY MOUTH TWO  TIMES DAILY WITH MEALS 01/28/19   Horald Pollen, MD  Multiple Vitamin (MULTIVITAMIN) tablet Take 1 tablet by mouth daily.    [provider]  pioglitazone (ACTOS) 15 MG tablet TAKE 1 TABLET BY MOUTH  DAILY 01/28/19   Sagardia, Ines Bloomer, MD  Semaglutide [facility]) 0.25 or 0.5 MG/DOSE SOPN Inject 0.5 mg into the skin once a week. 08/16/18   Philemon Kingdom, MD  sildenafil (VIAGRA) 100 MG tablet TAKE 1 TABLET BY MOUTH AS NEEDED FOR ERECTILE DYSFUNCTION 05/28/18   McVey, Gelene Mink, PA-C  sildenafil (VIAGRA) 100 MG tablet TAKE 1 TABLET BY MOUTH AS  NEEDED FOR ERECTILE  DYSFUNCTION AS DIRECTED 08/16/18   McVey, Gelene Mink, PA-C  sildenafil (VIAGRA) 100 MG tablet TAKE 1 TABLET BY MOUTH AS  NEEDED FOR  ERECTILE  DYSFUNCTION AS DIRECTED 10/13/18   McVey, Gelene Mink, PA-C  sildenafil (VIAGRA) 100 MG tablet TAKE 1 TABLET BY MOUTH AS  NEEDED FOR ERECTILE  DYSFUNCTION AS DIRECTED 01/28/19   Horald Pollen, MD  simvastatin (ZOCOR) 40 MG tablet Take 1 tablet (40 mg total) by mouth daily. 06/21/18   McVey, Gelene Mink, PA-C  simvastatin (ZOCOR) 40 MG tablet TAKE 1 TABLET BY MOUTH DAILY NEED APPOINTMENT FOR REFILLS 06/18/18   McVey, Gelene Mink, PA-C    Family History Family History  Problem Relation Age of Onset  . Diabetes Father   . Heart attack Father 73       Died age 48 MI    Social  History Social History   Tobacco Use  . Smoking status: Never Smoker  . Smokeless tobacco: Never Used  Substance Use Topics  . Alcohol use: Yes    Alcohol/week: 2.0 standard drinks    Types: 2 Standard drinks or equivalent per week    Comment: social   . Drug use: No     Allergies   Patient has no known allergies.   Review of Systems Review of Systems   Physical Exam Triage Vital Signs ED Triage Vitals  Enc Vitals Group     BP 02/12/19 1122 (!) 135/97     Pulse Rate 02/12/19 1122 93     Resp 02/12/19 1122 18     Temp 02/12/19 1122 97.7 F (36.5 C)     Temp Source 02/12/19 1122 Oral     SpO2 02/12/19 1122 99 %     Weight --      Height --      Head Circumference --      Peak Flow --      Pain Score 02/12/19 1123 0     Pain Loc --      Pain Edu? --      Excl. in [place]? --    No data found.  Updated Vital Signs BP (!) 135/97 (BP Location: Right Arm)   Pulse 93   Temp 97.7 F (36.5 C) (Oral)   Resp 18   SpO2 99%    Physical Exam Vitals signs and nursing note reviewed.  Constitutional:      Appearance: Normal appearance. He is obese.  HENT:     Head: Normocephalic.     Right Ear: Tympanic membrane and external ear normal.     Left Ear: Tympanic membrane and external ear normal.     Nose: Congestion present.     Mouth/Throat:     Pharynx: Oropharynx is clear.  Eyes:     Conjunctiva/sclera: Conjunctivae normal.  Neck:     Musculoskeletal: Normal range of motion and neck supple.  Cardiovascular:     Rate and Rhythm: Normal rate and regular rhythm.     Heart sounds: Normal heart sounds.  Pulmonary:     Effort: Pulmonary effort is normal.     Breath sounds: Rhonchi present.  Musculoskeletal: Normal range of motion.  Skin:    General: Skin is warm and dry.  Neurological:     General: No focal deficit present.     Mental Status: He is alert.  Psychiatric:        Mood and Affect: Mood normal.      UC Treatments / Results  Labs (all labs ordered  are listed, but only abnormal results are displayed) Labs Reviewed - No data to display  EKG None  Radiology No results found.  Procedures Procedures (including critical care time)  Medications Ordered in UC Medications - No data to display  Initial Impression / Assessment and Plan / UC Course  I have reviewed the triage vital signs and the nursing notes.  Pertinent labs & imaging results that were available during my care of the patient were reviewed by me and considered in my medical decision making (see chart for details).    Final Clinical Impressions(s) / UC Diagnoses   Final diagnoses:  Viral URI with cough  Bronchitis   Discharge Instructions   None    ED Prescriptions    Medication Sig Dispense Auth. Provider   benzonatate (TESSALON) 100 MG capsule Take 1-2 capsules (100-200 mg total) by mouth 3 (three) times daily as needed for cough. 40 capsule Robyn Haber, MD   albuterol (PROVENTIL HFA;VENTOLIN HFA) 108 (90 Base) MCG/ACT inhaler Inhale 2 puffs into the lungs every 4 (four) hours as needed for wheezing or shortness of breath (cough, shortness of breath or wheezing.). 1 Inhaler Robyn Haber, MD   amoxicillin (AMOXIL) 875 MG tablet Take 1 tablet (875 mg total) by mouth 2 (two) times daily. 20 tablet Robyn Haber, MD     Controlled Substance Prescriptions [place] Controlled Substance Registry consulted? Not Applicable   Robyn Haber, MD 02/12/19 1140

## 2019-03-04 ENCOUNTER — Ambulatory Visit: Payer: 59 | Admitting: Emergency Medicine

## 2019-03-07 ENCOUNTER — Ambulatory Visit: Payer: 59 | Admitting: Internal Medicine

## 2019-03-07 ENCOUNTER — Telehealth: Payer: Self-pay | Admitting: Internal Medicine

## 2019-03-07 NOTE — Telephone Encounter (Signed)
3-4 mo 

## 2019-03-07 NOTE — Telephone Encounter (Signed)
Patient no showed today's appt. Please advise on how to follow up. °A. No follow up necessary. °B. Follow up urgent. Contact patient immediately. °C. Follow up necessary. Contact patient and schedule visit in ___ days. °D. Follow up advised. Contact patient and schedule visit in ____weeks. ° °Would you like the NS fee to be applied to this visit? ° °

## 2019-03-28 ENCOUNTER — Other Ambulatory Visit: Payer: Self-pay | Admitting: Emergency Medicine

## 2019-03-28 ENCOUNTER — Other Ambulatory Visit: Payer: Self-pay | Admitting: Physician Assistant

## 2019-03-28 DIAGNOSIS — I10 Essential (primary) hypertension: Secondary | ICD-10-CM

## 2019-03-28 NOTE — Telephone Encounter (Signed)
Please advise 

## 2019-04-09 ENCOUNTER — Other Ambulatory Visit: Payer: Self-pay | Admitting: Physician Assistant

## 2019-04-09 DIAGNOSIS — E785 Hyperlipidemia, unspecified: Secondary | ICD-10-CM

## 2019-04-15 ENCOUNTER — Other Ambulatory Visit: Payer: Self-pay | Admitting: Physician Assistant

## 2019-04-15 DIAGNOSIS — E785 Hyperlipidemia, unspecified: Secondary | ICD-10-CM

## 2019-04-15 DIAGNOSIS — E119 Type 2 diabetes mellitus without complications: Secondary | ICD-10-CM

## 2019-04-15 MED ORDER — METFORMIN HCL 1000 MG PO TABS: ORAL_TABLET | ORAL | 0 refills | Status: DC

## 2019-04-15 NOTE — Telephone Encounter (Signed)
Refill request for metformin 1000 mg tab. No sig on this prescription LR 01/28/19 for 180 tabs and 1 refill. Dr. Mitchel Honour LOV 05/28/18 No upcoming appointment. Please review

## 2019-04-26 ENCOUNTER — Other Ambulatory Visit: Payer: Self-pay | Admitting: Physician Assistant

## 2019-04-26 DIAGNOSIS — E785 Hyperlipidemia, unspecified: Secondary | ICD-10-CM

## 2019-04-29 ENCOUNTER — Telehealth: Payer: Self-pay | Admitting: Physician Assistant

## 2019-04-29 ENCOUNTER — Other Ambulatory Visit: Payer: Self-pay | Admitting: Registered Nurse

## 2019-04-29 MED ORDER — SILDENAFIL CITRATE 100 MG PO TABS: ORAL_TABLET | ORAL | 0 refills | Status: DC

## 2019-04-29 NOTE — Progress Notes (Signed)
Pt called to discuss refill of sildenafil 100mg  prior to upcoming CPE Courtesy refill sent to pharmacy Pt to be seen 05/30/19 for CPE  Kathrin Ruddy, NP

## 2019-04-29 NOTE — Telephone Encounter (Signed)
Copied from [facility] [phone]. Topic: Quick Communication - Rx Refill/Question >> Apr 29, 2019  9:35 AM Virl Axe D wrote: Medication: sildenafil (VIAGRA) 100 MG tablet / Pt is out of medication and is requesting a refill be sent to his local pharmacy however, future refills should go to his mail order [facility] for 90 day supplies. Please advise.  Has the patient contacted their pharmacy? Yes.   (Agent: If no, request that the patient contact the pharmacy for the refill.) (Agent: If yes, when and what did the pharmacy advise?)  Preferred Pharmacy (with phone number or street name): [id number] [place], [place] [phone] (Phone) [phone] (Fax)    Agent: Please be advised that RX refills may take up to 3 business days. We ask that you follow-up with your pharmacy.

## 2019-04-29 NOTE — Telephone Encounter (Signed)
Spoke with pt and informed him a Rx will be sent to pharmacy, he verbalized understanding.

## 2019-05-30 ENCOUNTER — Ambulatory Visit (INDEPENDENT_AMBULATORY_CARE_PROVIDER_SITE_OTHER): Payer: 59 | Admitting: Family Medicine

## 2019-05-30 ENCOUNTER — Other Ambulatory Visit: Payer: Self-pay

## 2019-05-30 ENCOUNTER — Encounter: Payer: Self-pay | Admitting: Family Medicine

## 2019-05-30 ENCOUNTER — Encounter: Payer: 59 | Admitting: Registered Nurse

## 2019-05-30 VITALS — BP 130/81 | HR 81 | Temp 97.5°F | Ht 68.5 in | Wt 229.0 lb

## 2019-05-30 DIAGNOSIS — I10 Essential (primary) hypertension: Secondary | ICD-10-CM | POA: Diagnosis not present

## 2019-05-30 DIAGNOSIS — E785 Hyperlipidemia, unspecified: Secondary | ICD-10-CM

## 2019-05-30 DIAGNOSIS — Z0001 Encounter for general adult medical examination with abnormal findings: Secondary | ICD-10-CM

## 2019-05-30 DIAGNOSIS — E119 Type 2 diabetes mellitus without complications: Secondary | ICD-10-CM | POA: Diagnosis not present

## 2019-05-30 DIAGNOSIS — Z125 Encounter for screening for malignant neoplasm of prostate: Secondary | ICD-10-CM

## 2019-05-30 DIAGNOSIS — Z0000 Encounter for general adult medical examination without abnormal findings: Secondary | ICD-10-CM

## 2019-05-30 DIAGNOSIS — Z1211 Encounter for screening for malignant neoplasm of colon: Secondary | ICD-10-CM

## 2019-05-30 LAB — HEMOGLOBIN A1C
Est. average glucose Bld gHb Est-mCnc: 301 mg/dL
Hgb A1c MFr Bld: 12.1 % — ABNORMAL HIGH (ref 4.8–5.6)

## 2019-05-30 NOTE — Progress Notes (Signed)
6/8/202010:04 AM  Alexander [name] December 02, 1966, 53 y.o., male [id number]  Chief Complaint  Patient presents with  . Annual Exam    htn, diabetes    HPI:   Patient is a 53 y.o. male with past medical history significant for DM2, HTN  who presents today for CPE and routine office visit  Last CPE June 2019 Last saw endo Aug 2019, Dr Cruzita Lederer   Colorectal Cancer Screening: ordering today Prostate Cancer Screening: ordering today HIV Screening: reports has been tested in the past STI Screening: 2019 Seasonal Influenza Vaccination: advised each season Td/Tdap Vaccination: 2013 Pneumococcal Vaccination: patient reports had 2-3 years ago at his pharmacy Zoster Vaccination: at pharmacy of choice Frequency of Dental evaluation: last appt in 2019 Frequency of Eye evaluation: wear eyeglasses, last appt feb or march this year, [facility], per patient denies any retinopathy 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 No No No No     Depression screen [facility] 2/9 05/28/2018 11/27/2017 10/30/2016  Decreased Interest 0 0 0  Down, Depressed, Hopeless 0 0 0  PHQ - 2 Score 0 0 0    No Known Allergies  Prior to Admission medications   Medication Sig Start Date End Date Taking? Authorizing Provider  albuterol (PROVENTIL HFA;VENTOLIN HFA) 108 (90 Base) MCG/ACT inhaler Inhale 2 puffs into the lungs every 4 (four) hours as needed for wheezing or shortness of breath (cough, shortness of breath or wheezing.). 02/12/19   Yes Robyn Haber, MD  amoxicillin (AMOXIL) 875 MG tablet Take 1 tablet (875 mg total) by mouth 2 (two) times daily. 02/12/19  Yes Robyn Haber, MD  aspirin 81 MG tablet Take 1 tablet (81 mg total) by mouth every other day. 05/28/18  Yes McVey, Gelene Mink, PA-C  benzonatate (TESSALON) 100 MG capsule Take 1-2 capsules (100-200 mg total) by mouth 3 (three) times daily as needed for cough. 02/12/19  Yes Robyn Haber, MD  clotrimazole (LOTRIMIN) 1 % cream APPLY TO AFFECTED AREA TWICE A DAY *NOT COVERED* 07/05/18  Yes McVey, Gelene Mink, PA-C  Continuous Blood Gluc Sensor MISC 1 each by Does not apply route as directed. Use as directed every 10 days. May dispense FreeStyle [employer] or similar. 11/25/17  Yes Focht, Jessica L, PA  glipiZIDE (GLUCOTROL) 10 MG tablet TAKE 2 TABLETS BY MOUTH 2  TIMES DAILY BEFORE MEALS 10/13/18  Yes McVey, Gelene Mink, PA-C  losartan (COZAAR) 100 MG tablet TAKE 1 TABLET BY MOUTH  DAILY 03/28/19  Yes McVey, Gelene Mink, PA-C  metFORMIN (GLUCOPHAGE) 1000 MG tablet TAKE 1 TABLET BY MOUTH TWO  TIMES DAILY WITH MEALS 04/15/19  Yes Rutherford Guys, MD  Multiple Vitamin (MULTIVITAMIN) tablet Take 1 tablet by mouth daily.   Yes [provider]  pioglitazone (ACTOS) 15 MG tablet TAKE 1 TABLET BY MOUTH  DAILY 01/28/19  Yes Sagardia, Ines Bloomer, MD  Semaglutide [facility])  0.25 or 0.5 MG/DOSE SOPN Inject 0.5 mg into the skin once a week. 08/16/18  Yes Philemon Kingdom, MD  sildenafil (VIAGRA) 100 MG tablet TAKE 1 TABLET BY MOUTH AS  NEEDED FOR ERECTILE  DYSFUNCTION AS DIRECTED 04/29/19  Yes Maximiano Coss, NP  simvastatin (ZOCOR) 40 MG tablet TAKE 1 TABLET BY MOUTH DAILY NEED APPOINTMENT FOR REFILLS 06/18/18  Yes McVey, Gelene Mink, PA-C  simvastatin (ZOCOR) 40 MG tablet TAKE 1 TABLET BY MOUTH  DAILY 04/26/19  Yes McVey, Gelene Mink, PA-C    Past Medical History:  Diagnosis Date  . Diabetes mellitus without complication ([place])   .  Hyperlipidemia   . Hypertension     Past Surgical History:  Procedure Laterality Date  . I&D EXTREMITY Left 11/24/2017   Procedure: IRRIGATION AND DEBRIDEMENT LEFT THIGH ABSCESS;  Surgeon: Erroll Luna, MD;  Location: [facility];  Service: General;  Laterality: Left;  . ORIF HIP FRACTURE Right     Social History   Tobacco Use  . Smoking status: Never Smoker  . Smokeless tobacco: Never Used  Substance Use Topics  . Alcohol use: Yes    Alcohol/week: 2.0 standard drinks    Types: 2 Standard drinks or equivalent per week    Comment: social     Family History  Problem Relation Age of Onset  . Diabetes Father   . Heart attack Father 43       Died age 62 MI    Review of Systems  Constitutional: Negative for chills and fever.  Eyes: Negative for blurred vision and double vision.  Respiratory: Negative for cough and shortness of breath.   Cardiovascular: Negative for chest pain, palpitations and leg swelling.  Gastrointestinal: Negative for abdominal pain, nausea and vomiting.  Genitourinary: Negative for dysuria, hematuria and urgency.       Nocturia x 1 ED responds well to viagra  Neurological: Negative for dizziness and tingling.  Endo/Heme/Allergies: Negative for polydipsia.  All other systems reviewed and are negative.    OBJECTIVE:  Today's Vitals   05/30/19 1003  BP: 130/81  Pulse: 81  Temp: (!) 97.5 F (36.4 C)  TempSrc: Oral  SpO2: 96%  Weight: 229 lb (103.9 kg)  Height: 5' 8.5" (1.74 m)   Body mass index is 34.31 kg/m.  BP Readings from Last 3 Encounters:  05/30/19 130/81  02/12/19 (!) 135/97  08/16/18 140/90   Wt Readings from Last 3 Encounters:  05/30/19 229 lb (103.9 kg)  08/16/18 233 lb 12.8 oz (106.1 kg)  05/28/18 216 lb 6.4 oz (98.2 kg)    Physical Exam Vitals signs and nursing note reviewed.  Constitutional:      General: He is not in acute distress.    Appearance: He is well-developed.  HENT:     Head: Normocephalic and atraumatic.      Right Ear: Hearing, tympanic membrane, ear canal and external ear normal.     Left Ear: Hearing, tympanic membrane, ear canal and external ear normal.     Mouth/Throat:     Pharynx: No oropharyngeal exudate.  Eyes:     Conjunctiva/sclera: Conjunctivae normal.     Pupils: Pupils are equal, round, and reactive to light.  Neck:     Musculoskeletal: Neck supple.     Thyroid: No thyromegaly.  Cardiovascular:     Rate and Rhythm: Normal rate and regular rhythm.     Pulses: Normal pulses.     Heart sounds: Normal heart sounds. No murmur. No friction rub. No gallop.  Pulmonary:     Effort: Pulmonary effort is normal.     Breath sounds: Normal breath sounds. No wheezing, rhonchi or rales.  Abdominal:     General: Bowel sounds are normal. There is no distension.     Palpations: Abdomen is soft. There is no mass.     Tenderness: There is no abdominal tenderness.  Musculoskeletal: Normal range of motion.     Right lower leg: No edema.     Left lower leg: No edema.  Lymphadenopathy:     Cervical: No cervical adenopathy.  Skin:    General: Skin is warm and dry.  Neurological:     General: No focal deficit present.     Mental Status: He is alert and oriented to person, place, and time.     Cranial Nerves: No cranial nerve deficit.     Coordination: Coordination normal.     Gait: Gait normal.     Deep Tendon Reflexes: Reflexes are normal and symmetric.  Psychiatric:        Mood and Affect: Mood normal.        Behavior: Behavior normal.      Diabetic Foot Exam - Simple   Simple Foot Form Visual Inspection No deformities, no ulcerations, no other skin breakdown bilaterally:  Yes Sensation Testing Intact to touch and monofilament testing bilaterally:  Yes Pulse Check Comments Felt all pricks with the filament, no broken skin      ASSESSMENT and PLAN  1. Annual physical exam Routine HCM labs ordered. HCM reviewed/discussed. Anticipatory guidance regarding healthy weight,  lifestyle and choices given.   2. Type 2 diabetes mellitus without complication, without long-term current use of insulin ([place]) Uncontrolled per random cbgs at home and last A1c. Patient has not been compliant with routine care. Advised return to care of endocrinology, discussed importance of routine care and risks of end organ damage and early death. Labs pending.  - Lipid panel - TSH - CMP14+EGFR - Microalbumin / creatinine urine ratio - Hemoglobin A1c - Ambulatory referral to Endocrinology  3. Hyperlipidemia, unspecified hyperlipidemia type Checking labs today, medications will be adjusted as needed.  - Lipid panel - TSH - CMP14+EGFR  4. Essential hypertension Controlled. Continue current regime.  - Lipid panel - TSH - CMP14+EGFR - CBC  5. Special screening for malignant neoplasms, colon - Ambulatory referral to Gastroenterology  6. Screening for prostate cancer - PSA  Return in about 3 months (around 08/30/2019).    Rutherford Guys, MD [facility] [place], [place] [address] Ph.  [phone] Fax [phone]

## 2019-05-30 NOTE — Patient Instructions (Addendum)
Singrix, shingles immunization, 2 vaccine series, at pharmacy of choice   Preventive Care 40-64 Years, Male Preventive care refers to lifestyle choices and visits with your health care provider that can promote health and wellness. What does preventive care include?   A yearly physical exam. This is also called an annual well check.  Dental exams once or twice a year.  Routine eye exams. Ask your health care provider how often you should have your eyes checked.  Personal lifestyle choices, including: ? Daily care of your teeth and gums. ? Regular physical activity. ? Eating a healthy diet. ? Avoiding tobacco and drug use. ? Limiting alcohol use. ? Practicing safe sex. ? Taking low-dose aspirin every day starting at age 70. What happens during an annual well check? The services and screenings done by your health care provider during your annual well check will depend on your age, overall health, lifestyle risk factors, and family history of disease. Counseling Your health care provider may ask you questions about your:  Alcohol use.  Tobacco use.  Drug use.  Emotional well-being.  Home and relationship well-being.  Sexual activity.  Eating habits.  Work and work Statistician. Screening You may have the following tests or measurements:  Height, weight, and BMI.  Blood pressure.  Lipid and cholesterol levels. These may be checked every 5 years, or more frequently if you are over 68 years old.  Skin check.  Lung cancer screening. You may have this screening every year starting at age 25 if you have a 30-pack-year history of smoking and currently smoke or have quit within the past 15 years.  Colorectal cancer screening. All adults should have this screening starting at age 69 and continuing until age 75. Your health care provider may recommend screening at age 58. You will have tests every 1-10 years, depending on your results and the type of screening test. People at  increased risk should start screening at an earlier age. Screening tests may include: ? Guaiac-based fecal occult blood testing. ? Fecal immunochemical test (FIT). ? Stool DNA test. ? Virtual colonoscopy. ? Sigmoidoscopy. During this test, a flexible tube with a tiny camera (sigmoidoscope) is used to examine your rectum and lower colon. The sigmoidoscope is inserted through your anus into your rectum and lower colon. ? Colonoscopy. During this test, a long, thin, flexible tube with a tiny camera (colonoscope) is used to examine your entire colon and rectum.  Prostate cancer screening. Recommendations will vary depending on your family history and other risks.  Hepatitis C blood test.  Hepatitis B blood test.  Sexually transmitted disease (STD) testing.  Diabetes screening. This is done by checking your blood sugar (glucose) after you have not eaten for a while (fasting). You may have this done every 1-3 years. Discuss your test results, treatment options, and if necessary, the need for more tests with your health care provider. Vaccines Your health care provider may recommend certain vaccines, such as:  Influenza vaccine. This is recommended every year.  Tetanus, diphtheria, and acellular pertussis (Tdap, Td) vaccine. You may need a Td booster every 10 years.  Varicella vaccine. You may need this if you have not been vaccinated.  Zoster vaccine. You may need this after age 71.  Measles, mumps, and rubella (MMR) vaccine. You may need at least one dose of MMR if you were born in 1957 or later. You may also need a second dose.  Pneumococcal 13-valent conjugate (PCV13) vaccine. You may need this if you have  certain conditions and have not been vaccinated.  Pneumococcal polysaccharide (PPSV23) vaccine. You may need one or two doses if you smoke cigarettes or if you have certain conditions.  Meningococcal vaccine. You may need this if you have certain conditions.  Hepatitis A vaccine.  You may need this if you have certain conditions or if you travel or work in places where you may be exposed to hepatitis A.  Hepatitis B vaccine. You may need this if you have certain conditions or if you travel or work in places where you may be exposed to hepatitis B.  Haemophilus influenzae type b (Hib) vaccine. You may need this if you have certain risk factors. Talk to your health care provider about which screenings and vaccines you need and how often you need them. This information is not intended to replace advice given to you by your health care provider. Make sure you discuss any questions you have with your health care provider. Document Released: 01/04/2016 Document Revised: 01/28/2018 Document Reviewed: 10/09/2015 Elsevier Interactive Patient Education  2019 [employer].

## 2019-05-31 LAB — PSA: Prostate Specific Ag, Serum: 0.7 ng/mL (ref 0.0–4.0)

## 2019-05-31 LAB — CBC
Hematocrit: 46.2 % (ref 37.5–51.0)
Hemoglobin: 15.5 g/dL (ref 13.0–17.7)
MCH: 29.5 pg (ref 26.6–33.0)
MCHC: 33.5 g/dL (ref 31.5–35.7)
MCV: 88 fL (ref 79–97)
Platelets: 241 10*3/uL (ref 150–450)
RBC: 5.26 x10E6/uL (ref 4.14–5.80)
RDW: 13.2 % (ref 11.6–15.4)
WBC: 5.8 10*3/uL (ref 3.4–10.8)

## 2019-05-31 LAB — CMP14+EGFR
ALT: 12 IU/L (ref 0–44)
AST: 11 IU/L (ref 0–40)
Albumin/Globulin Ratio: 1.6 (ref 1.2–2.2)
Albumin: 4.2 g/dL (ref 3.8–4.9)
Alkaline Phosphatase: 59 IU/L (ref 39–117)
BUN/Creatinine Ratio: 18 (ref 9–20)
BUN: 16 mg/dL (ref 6–24)
Bilirubin Total: 0.8 mg/dL (ref 0.0–1.2)
CO2: 20 mmol/L (ref 20–29)
Calcium: 9.2 mg/dL (ref 8.7–10.2)
Chloride: 100 mmol/L (ref 96–106)
Creatinine, Ser: 0.89 mg/dL (ref 0.76–1.27)
GFR calc Af Amer: 112 mL/min/{1.73_m2} (ref >59)
GFR calc non Af Amer: 97 mL/min/{1.73_m2} (ref >59)
Globulin, Total: 2.6 g/dL (ref 1.5–4.5)
Glucose: 320 mg/dL — ABNORMAL HIGH (ref 65–99)
Potassium: 4.5 mmol/L (ref 3.5–5.2)
Sodium: 136 mmol/L (ref 134–144)
Total Protein: 6.8 g/dL (ref 6.0–8.5)

## 2019-05-31 LAB — LIPID PANEL
Chol/HDL Ratio: 2.3 ratio (ref 0.0–5.0)
Cholesterol, Total: 142 mg/dL (ref 100–199)
HDL: 63 mg/dL (ref >39)
LDL Calculated: 59 mg/dL (ref 0–99)
Triglycerides: 100 mg/dL (ref 0–149)
VLDL Cholesterol Cal: 20 mg/dL (ref 5–40)

## 2019-05-31 LAB — TSH: TSH: 1.06 u[IU]/mL (ref 0.450–4.500)

## 2019-05-31 NOTE — Telephone Encounter (Signed)
Had CPE 05/30/2019

## 2019-06-04 ENCOUNTER — Other Ambulatory Visit: Payer: Self-pay | Admitting: Emergency Medicine

## 2019-06-04 DIAGNOSIS — E119 Type 2 diabetes mellitus without complications: Secondary | ICD-10-CM

## 2019-06-16 ENCOUNTER — Telehealth: Payer: Self-pay | Admitting: Family Medicine

## 2019-06-16 NOTE — Telephone Encounter (Signed)
Pt called about the status of his refill for sildenafil (VIAGRA) 100 MG tablet / please advise and send to   [id number] [place] [place] [phone] (Phone) [phone] (Fax)

## 2019-06-20 ENCOUNTER — Other Ambulatory Visit: Payer: Self-pay

## 2019-06-20 ENCOUNTER — Other Ambulatory Visit: Payer: Self-pay | Admitting: Physician Assistant

## 2019-06-20 MED ORDER — SILDENAFIL CITRATE 100 MG PO TABS: ORAL_TABLET | ORAL | 0 refills | Status: DC

## 2019-06-20 NOTE — Telephone Encounter (Signed)
Pt called again to check on the status of this refill.

## 2019-06-21 NOTE — Telephone Encounter (Signed)
Would Dr. Pamella Pert be taking over this rx?

## 2019-06-30 ENCOUNTER — Other Ambulatory Visit: Payer: Self-pay

## 2019-07-04 ENCOUNTER — Ambulatory Visit (INDEPENDENT_AMBULATORY_CARE_PROVIDER_SITE_OTHER): Payer: 59 | Admitting: Internal Medicine

## 2019-07-04 ENCOUNTER — Encounter: Payer: Self-pay | Admitting: Internal Medicine

## 2019-07-04 ENCOUNTER — Other Ambulatory Visit: Payer: Self-pay

## 2019-07-04 VITALS — BP 118/80 | HR 90 | Ht 68.5 in | Wt 232.0 lb

## 2019-07-04 DIAGNOSIS — E1142 Type 2 diabetes mellitus with diabetic polyneuropathy: Secondary | ICD-10-CM

## 2019-07-04 DIAGNOSIS — E1165 Type 2 diabetes mellitus with hyperglycemia: Secondary | ICD-10-CM

## 2019-07-04 DIAGNOSIS — IMO0002 Reserved for concepts with insufficient information to code with codable children: Secondary | ICD-10-CM

## 2019-07-04 MED ORDER — TRESIBA FLEXTOUCH 200 UNIT/ML ~~LOC~~ SOPN: 14.0000 [IU] | PEN_INJECTOR | Freq: Every day | SUBCUTANEOUS | 3 refills | Status: DC

## 2019-07-04 MED ORDER — FREESTYLE LIBRE 14 DAY SENSOR MISC: 1.0000 | 11 refills | Status: DC

## 2019-07-04 MED ORDER — INSULIN PEN NEEDLE 32G X 4 MM MISC: 3 refills | Status: DC

## 2019-07-04 MED ORDER — OZEMPIC (1 MG/DOSE) 2 MG/1.5ML ~~LOC~~ SOPN: 1.0000 mg | PEN_INJECTOR | SUBCUTANEOUS | 5 refills | Status: DC

## 2019-07-04 NOTE — Progress Notes (Signed)
Patient ID: Alexander [name], male   DOB: [birth year], 53 y.o.   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 Loanne Drilling for this in 2014).  Last visit almost a year ago.  He missed 3 appointments since last visit.  Last hemoglobin A1c was: Lab Results  Component Value Date   HGBA1C 12.1 (H) 05/30/2019   HGBA1C 11.9 (A) 08/16/2018   HGBA1C 14.0 (A) 05/28/2018   HGBA1C 12.1 (H) 11/23/2017   HGBA1C 11.9 10/30/2016   HGBA1C 11.2 (H) 10/21/2016   HGBA1C 11.4 03/08/2016   HGBA1C 13.6 07/22/2015   HGBA1C 11.1 12/23/2014   HGBA1C 10.1 08/21/2014   Pt is on a regimen of: - Metformin 1000 mg 2x a day, with meals - Actos 15 mg daily >> ran out x1 mo, then restarted 3 weeks ago - Glipizide 10 mg 2x a day before meals - Ozempic 0.5 mg weekly  He drives a truck.  He refuses insulin.  He was checking his sugars >4X a day with his libre CGM, but ran out of sensors - now checking with glucometer.  Pt checks his sugars ~1x a day: - am: 200-250 >> 125, 175-250 - 2h after b'fast: n/c - before lunch: n/c - 2h after lunch: n/c - before dinner: n/c - 2h after dinner: 200s >> 200-300 - bedtime: n/c - nighttime: n/c Lowest sugar was 150 >> 125 ;?  At which CBG level he has hypoglycemia awareness.  Highest sugar was 280 >> 300.  Glucometer: ReliOn  Pt's meals are: - Breakfast: 2 eggs, 2 bacon pieces, OJ - Lunch: fast food, grilled fish + green beans, cabbage, corn, no bread, unsweet tea - Dinner: cracker-barell: grilled fish or chicken + turnip greens - Snacks: fruit, nuts  -No CKD, last BUN/creatinine:  Lab Results  Component Value Date   BUN 16 05/30/2019   BUN 13 05/28/2018   CREATININE 0.89 05/30/2019   CREATININE 0.85 05/28/2018  On Cozaar 100.  -+ HL;  last set of lipids: Lab Results  Component Value Date   CHOL 142 05/30/2019   HDL 63 05/30/2019   LDLCALC 59 05/30/2019   TRIG 100 05/30/2019   CHOLHDL 2.3 05/30/2019  On Zocor 40.  - last eye exam was in 02/2019: No DR reportedly.  - no numbness and tingling in his feet.  On ASA 81.  ROS: Constitutional: no weight gain/no weight loss, no fatigue, no subjective hyperthermia, no subjective hypothermia Eyes: no blurry vision, no xerophthalmia ENT: no sore throat, no nodules palpated in neck, no dysphagia, no odynophagia, no hoarseness Cardiovascular: no CP/no SOB/no palpitations/no leg swelling Respiratory: no cough/no SOB/no wheezing Gastrointestinal: no N/no V/no D/no C/no acid reflux Musculoskeletal: no muscle aches/no joint aches Skin: no rashes, no hair loss Neurological: no tremors/no numbness/no tingling/no dizziness  I reviewed pt's medications, allergies, PMH, social hx, family hx, and changes were documented in the history of present illness. Otherwise, unchanged from my initial visit note.  Past Medical History:  Diagnosis Date  . Diabetes mellitus without complication ([place])   . Hyperlipidemia   . Hypertension    Past Surgical History:  Procedure Laterality Date  . I&D EXTREMITY Left 11/24/2017   Procedure: IRRIGATION AND DEBRIDEMENT LEFT THIGH ABSCESS;  Surgeon: Erroll Luna, MD;  Location:  [facility];  Service: General;  Laterality: Left;  . ORIF HIP FRACTURE Right    Social History   Socioeconomic History  . Marital status: Married    Spouse name: Alexander [name]  . Number of children: 1  . Years of education: college  . Highest education level: Not on file  Occupational History  . Occupation: Self employed  Social Needs  . Financial resource strain: Not on file  . Food insecurity    Worry: Not on file    Inability: Not on file  . Transportation needs    Medical: Not on file    Non-medical: Not on file  Tobacco Use  . Smoking status: Never Smoker  .  Smokeless tobacco: Never Used  Substance and Sexual Activity  . Alcohol use: Yes    Alcohol/week: 2.0 standard drinks    Types: 2 Standard drinks or equivalent per week    Comment: social   . Drug use: No  . Sexual activity: Yes    Partners: Female  Lifestyle  . Physical activity    Days per week: Not on file    Minutes per session: Not on file  . Stress: Not on file  Relationships  . Social Herbalist on phone: Not on file    Gets together: Not on file    Attends religious service: Not on file    Active member of club or organization: Not on file    Attends meetings of clubs or organizations: Not on file    Relationship status: Not on file  . Intimate partner violence    Fear of current or ex partner: Not on file    Emotionally abused: Not on file    Physically abused: Not on file    Forced sexual activity: Not on file  Other Topics Concern  . Not on file  Social History Narrative   Lives with wife.     Current Outpatient Medications on File Prior to Visit  Medication Sig Dispense Refill  . aspirin 81 MG tablet Take 1 tablet (81 mg total) by mouth every other day. 30 tablet 11  . clotrimazole (LOTRIMIN) 1 % cream APPLY TO AFFECTED AREA TWICE A DAY *NOT COVERED* 30 g 0  . Continuous Blood Gluc Sensor MISC 1 each by Does not apply route as directed. Use as directed every 10 days. May dispense FreeStyle [employer] or similar. 3 each 0  . glipiZIDE (GLUCOTROL) 10 MG tablet TAKE 2 TABLETS BY MOUTH 2  TIMES DAILY BEFORE MEALS 360 tablet 3  . losartan (COZAAR) 100 MG tablet TAKE 1 TABLET BY MOUTH  DAILY 90 tablet 0  . metFORMIN (GLUCOPHAGE) 1000 MG tablet TAKE 1 TABLET BY MOUTH TWO  TIMES DAILY WITH MEALS 60 tablet 0  . Multiple Vitamin (MULTIVITAMIN) tablet Take 1 tablet by mouth daily.    . pioglitazone (ACTOS) 15 MG tablet TAKE 1 TABLET BY MOUTH  DAILY 90 tablet 0  . Semaglutide (OZEMPIC) 0.25 or 0.5 MG/DOSE SOPN Inject 0.5 mg into the skin once a week. 2 pen  5  . sildenafil (VIAGRA) 100 MG tablet TAKE 1 TABLET BY MOUTH AS  NEEDED FOR ERECTILE  DYSFUNCTION AS DIRECTED 4 tablet 0  . simvastatin (ZOCOR) 40 MG tablet TAKE 1 TABLET BY MOUTH  DAILY 90 tablet 0   No current facility-administered medications on file prior to visit.    No Known Allergies Family History  Problem Relation Age of Onset  . Diabetes Father   .  Heart attack Father 58       Died age 26 MI    PE: BP 118/80   Pulse 90   Ht 5' 8.5" (1.74 m)   Wt 232 lb (105.2 kg)   SpO2 96%   BMI 34.76 kg/m  Wt Readings from Last 3 Encounters:  07/04/19 232 lb (105.2 kg)  05/30/19 229 lb (103.9 kg)  08/16/18 233 lb 12.8 oz (106.1 kg)   Constitutional: Obese, in NAD Eyes: PERRLA, EOMI, no exophthalmos ENT: moist mucous membranes, no thyromegaly, no cervical lymphadenopathy Cardiovascular: RRR, No MRG Respiratory: CTA B Gastrointestinal: abdomen soft, NT, ND, BS+ Musculoskeletal: no deformities, strength intact in all 4 Skin: moist, warm, no rashes Neurological: no tremor with outstretched hands, DTR normal in all 4  ASSESSMENT: 1. DM2, non-insulin-dependent, uncontrolled, with long-term complications - PN - ED  2. HL  3. Obesity  PLAN:  1. Patient with longstanding, uncontrolled, type 2 diabetes, on oral antidiabetic regimen to which we added weekly GLP-1 receptor agonist at last visit.  At that time, HbA1c was very high and it worsened at the last check last month, to 12.1%, despite adding Ozempic at last visit. -He does mention that he had a few good blood sugars at the start of the coronavirus pandemic as he could not eat out and could not drive his truck at that time.  -However, since then, sugars are very high; he did not start to switch towards a more low-fat,  plant-based diet that I recommended at last visit. -We discussed again at this visit about possible complications from his many years of uncontrolled diabetes and the fact that he absolutely needs long-acting  insulin unless he makes a drastic change in his diet.  Since he is not ready for a dietary change, I again strongly recommended insulin even if this will not allow him to drive.  However, he would like to check with the DOT first to see if he can take a dose of long-acting insulin daily.  I gave him a prescription for this. -In the meantime, we will also try to increase Ozempic dose since he is tolerating this well.  I did advise him, however, that this would not be enough to improve his diabetes - I suggested to:  Patient Instructions  Please continue: - Metformin 1000 mg 2x a day, with meals - Actos 15 mg daily  - Glipizide 10 mg 2x a day before meals  Please increase: - Ozempic to 1 mg weekly  Please try to add: - Tresiba 14 units daily and increase by 4 units every 4 days until sugars in am are <140 or you get to 34 units - let me know in this case  Please come back for a follow-up appointment in 3 months.   - advised to check sugars at different times of the day - 1-2x a day, rotating check times - advised for yearly eye exams >> he is UTD - return to clinic in 3 months  2. HL - Reviewed latest lipid panel from last month: All fractions at goal Lab Results  Component Value Date   CHOL 142 05/30/2019   HDL 63 05/30/2019   LDLCALC 59 05/30/2019   TRIG 100 05/30/2019   CHOLHDL 2.3 05/30/2019  - Continues Zocor without side effects.  3. Obesity -Lost approximately 4 pounds since last visit -will continue Ozempic Which should also help with weight loss -I am hoping we can also stop Actos and glipizide in the near future since  they are both conducive to weight gain.  Philemon Kingdom, MD PhD [facility] Endocrinology

## 2019-07-04 NOTE — Patient Instructions (Addendum)
Please continue: - Metformin 1000 mg 2x a day, with meals - Actos 15 mg daily  - Glipizide 10 mg 2x a day before meals  Please increase: - Ozempic to 1 mg weekly  Please try to add: - Tresiba 14 units daily and increase by 4 units every 4 days until sugars in am are <140 or you get to 34 units - let me know in this case  Please come back for a follow-up appointment in 3 months.

## 2019-07-22 ENCOUNTER — Other Ambulatory Visit: Payer: Self-pay | Admitting: Registered Nurse

## 2019-07-22 NOTE — Telephone Encounter (Signed)
Please advise  Patient is requesting a refill of the following medications: Requested Prescriptions   Pending Prescriptions Disp Refills  . sildenafil (VIAGRA) 100 MG tablet [Pharmacy Med Name: SILDENAFIL 100 MG TABLET] 4 tablet 0    Sig: TAKE 1 TABLET BY MOUTH AS NEEDED FOR ERECTILE DYSFUNCTION AS DIRECTED

## 2019-08-29 ENCOUNTER — Other Ambulatory Visit: Payer: Self-pay | Admitting: Emergency Medicine

## 2019-08-29 DIAGNOSIS — E119 Type 2 diabetes mellitus without complications: Secondary | ICD-10-CM

## 2019-08-30 NOTE — Telephone Encounter (Signed)
Requested medication (s) are due for refill today: yes   Requested medication (s) are on the active medication list:yes  Last refill:  07/05/2019  Future visit scheduled: yes  Notes to clinic:  Requesting 1 year supply   Requested Prescriptions  Pending Prescriptions Disp Refills   pioglitazone (ACTOS) 15 MG tablet [Pharmacy Med Name: PIOGLITAZONE  15MG   TAB] 90 tablet 3    Sig: TAKE 1 TABLET BY MOUTH  DAILY     Endocrinology:  Diabetes - Glitazones - pioglitazone Failed - 08/29/2019  9:55 PM      Failed - HBA1C is between 0 and 7.9 and within 180 days    Hgb A1c MFr Bld  Date Value Ref Range Status  05/30/2019 12.1 (H) 4.8 - 5.6 % Final    Comment:             Prediabetes: 5.7 - 6.4          Diabetes: >6.4          Glycemic control for adults with diabetes: <7.0          Passed - Valid encounter within last 6 months    Recent Outpatient Visits          3 months ago Annual physical exam   [facility], Lilia Argue, MD   1 year ago Annual physical exam   Primary [facility] at [facility], Gelene Mink, PA-C   1 year ago Hospital discharge follow-up   [facility] at [facility], Gelene Mink, PA-C   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

## 2019-09-05 ENCOUNTER — Ambulatory Visit (INDEPENDENT_AMBULATORY_CARE_PROVIDER_SITE_OTHER): Payer: 59 | Admitting: Family Medicine

## 2019-09-05 ENCOUNTER — Other Ambulatory Visit: Payer: Self-pay

## 2019-09-05 ENCOUNTER — Encounter: Payer: Self-pay | Admitting: Family Medicine

## 2019-09-05 VITALS — BP 109/73 | HR 99 | Temp 98.2°F | Ht 68.5 in | Wt 230.0 lb

## 2019-09-05 DIAGNOSIS — E119 Type 2 diabetes mellitus without complications: Secondary | ICD-10-CM

## 2019-09-05 DIAGNOSIS — Z1211 Encounter for screening for malignant neoplasm of colon: Secondary | ICD-10-CM

## 2019-09-05 DIAGNOSIS — E785 Hyperlipidemia, unspecified: Secondary | ICD-10-CM | POA: Diagnosis not present

## 2019-09-05 DIAGNOSIS — I10 Essential (primary) hypertension: Secondary | ICD-10-CM | POA: Diagnosis not present

## 2019-09-05 NOTE — Progress Notes (Signed)
9/14/20208:16 AM  Alexander [name] 1966-10-02, 53 y.o., male [id number]  Chief Complaint  Patient presents with  . Diabetes  . Headache  . Hyperlipidemia    HPI:   Patient is a 53 y.o. male with past medical history significant for HTN and DM2 who presents today for routine followup  Last OV June 2020 - no changes Saw endo July 2020 - increased ozempic, was going to ask DOT about tresiba  Working on diet and exercise Taking all meds at discussed Had eye exam in march 2020 Declines flu vaccine   Lab Results  Component Value Date   HGBA1C 12.1 (H) 05/30/2019   HGBA1C 11.9 (A) 08/16/2018   HGBA1C 14.0 (A) 05/28/2018   Lab Results  Component Value Date   MICROALBUR 1.5 07/22/2015   LDLCALC 59 05/30/2019   CREATININE 0.89 05/30/2019    Depression screen PHQ 2/9 09/05/2019 05/30/2019 05/28/2018  Decreased Interest 0 0 0  Down, Depressed, Hopeless 0 0 0  PHQ - 2 Score 0 0 0    Fall Risk  09/05/2019 05/30/2019 05/28/2018 11/27/2017 10/30/2016  Falls in the past year? 0 0 No No No  Number falls in past yr: 0 0 - - -  Injury with Fall? 0 0 - - -     No Known Allergies  Prior to Admission medications   Medication Sig Start Date End Date Taking? Authorizing Provider  aspirin 81 MG tablet Take 1 tablet (81 mg total) by mouth every other day. 05/28/18  Yes McVey, Gelene Mink, PA-C  clotrimazole (LOTRIMIN) 1 % cream APPLY TO AFFECTED AREA TWICE A DAY *NOT COVERED* 07/05/18  Yes McVey, Gelene Mink, PA-C  Continuous Blood Gluc Sensor (FREESTYLE LIBRE 14 DAY SENSOR) MISC 1 each by Does not apply route every 14 (fourteen) days. Change every 2 weeks 07/04/19  Yes Philemon Kingdom, MD  glipiZIDE (GLUCOTROL) 10 MG tablet TAKE 2 TABLETS BY MOUTH 2  TIMES DAILY BEFORE MEALS 10/13/18  Yes McVey, Gelene Mink, PA-C  Insulin Degludec (TRESIBA FLEXTOUCH) 200 UNIT/ML SOPN Inject 14 Units into the skin daily. 07/04/19  Yes Philemon Kingdom, MD  Insulin Pen Needle 32G X 4 MM MISC Use 1x a  day 07/04/19  Yes Philemon Kingdom, MD  losartan (COZAAR) 100 MG tablet TAKE 1 TABLET BY MOUTH  DAILY 03/28/19  Yes McVey, Gelene Mink, PA-C  metFORMIN (GLUCOPHAGE) 1000 MG tablet TAKE 1 TABLET BY MOUTH TWO  TIMES DAILY WITH MEALS 04/15/19  Yes Rutherford Guys, MD  Multiple Vitamin (MULTIVITAMIN) tablet Take 1 tablet by mouth daily.   Yes [provider]  pioglitazone (ACTOS) 15 MG tablet TAKE 1 TABLET BY MOUTH  DAILY 09/01/19  Yes Rutherford Guys, MD  Semaglutide, 1 MG/DOSE, (OZEMPIC, 1 MG/DOSE,) 2 MG/1.5ML SOPN Inject 1 mg into the skin once a week. 07/04/19  Yes Philemon Kingdom, MD  sildenafil (VIAGRA) 100 MG tablet TAKE 1 TABLET BY MOUTH AS NEEDED FOR ERECTILE DYSFUNCTION AS DIRECTED 07/22/19  Yes Rutherford Guys, MD  simvastatin (ZOCOR) 40 MG tablet TAKE 1 TABLET BY MOUTH  DAILY 06/21/19  Yes Rutherford Guys, MD  sildenafil (VIAGRA) 100 MG tablet TAKE 1 TABLET BY MOUTH AS  NEEDED FOR ERECTILE  DYSFUNCTION AS DIRECTED 06/20/19   Maximiano Coss, NP    Past Medical History:  Diagnosis Date  . Diabetes mellitus without complication ([place])   . Hyperlipidemia   . Hypertension     Past Surgical History:  Procedure Laterality Date  . I&D EXTREMITY Left  11/24/2017   Procedure: IRRIGATION AND DEBRIDEMENT LEFT THIGH ABSCESS;  Surgeon: Erroll Luna, MD;  Location: [facility];  Service: General;  Laterality: Left;  . ORIF HIP FRACTURE Right     Social History   Tobacco Use  . Smoking status: Never Smoker  . Smokeless tobacco: Never Used  Substance Use Topics  . Alcohol use: Yes    Alcohol/week: 2.0 standard drinks    Types: 2 Standard drinks or equivalent per week    Comment: social     Family History  Problem Relation Age of Onset  . Diabetes Father   . Heart attack Father 67       Died age 75 MI    Review of Systems  Constitutional: Negative for chills and fever.  Respiratory: Negative for cough and shortness of breath.   Cardiovascular: Negative for chest pain,  palpitations and leg swelling.  Gastrointestinal: Negative for abdominal pain, nausea and vomiting.     OBJECTIVE:  Today's Vitals   09/05/19 0814  BP: 109/73  Pulse: 99  Temp: 98.2 F (36.8 C)  TempSrc: Oral  SpO2: 96%  Weight: 230 lb (104.3 kg)  Height: 5' 8.5" (1.74 m)   Body mass index is 34.46 kg/m.   Wt Readings from Last 3 Encounters:  09/05/19 230 lb (104.3 kg)  07/04/19 232 lb (105.2 kg)  05/30/19 229 lb (103.9 kg)     Physical Exam Vitals signs and nursing note reviewed.  Constitutional:      Appearance: He is well-developed.  HENT:     Head: Normocephalic and atraumatic.  Eyes:     Conjunctiva/sclera: Conjunctivae normal.     Pupils: Pupils are equal, round, and reactive to light.  Neck:     Musculoskeletal: Neck supple.  Cardiovascular:     Rate and Rhythm: Normal rate and regular rhythm.     Heart sounds: No murmur. No friction rub. No gallop.   Pulmonary:     Effort: Pulmonary effort is normal.     Breath sounds: Normal breath sounds. No wheezing or rales.  Skin:    General: Skin is warm and dry.  Neurological:     Mental Status: He is alert and oriented to person, place, and time.     No results found for this or any previous visit (from the past 24 hour(s)).  No results found.   ASSESSMENT and PLAN  1. Type 2 diabetes mellitus without complication, without long-term current use of insulin ([place]) Managed by endo, encouraged him keeping scheduled appt - Microalbumin / creatinine urine ratio  2. Essential hypertension Controlled. Continue current regime.   3. Hyperlipidemia, unspecified hyperlipidemia type Controlled. Continue current regime.   4. Special screening for malignant neoplasms, colon - Cologuard   Return in about 6 months (around 03/04/2020).    Rutherford Guys, MD Primary Care at [facility] [place], [place] [address] Ph.  [phone] Fax [phone]

## 2019-09-05 NOTE — Patient Instructions (Signed)
° ° ° °  If you have lab work done today you will be contacted with your lab results within the next 2 weeks.  If you have not heard from us then please contact us. The fastest way to get your results is to register for My Chart. ° ° °IF you received an x-ray today, you will receive an invoice from [facility]. Please contact  Radiology at [phone] with questions or concerns regarding your invoice.  ° °IF you received labwork today, you will receive an invoice from [facility]. Please contact [facility] at [phone] with questions or concerns regarding your invoice.  ° °Our billing staff will not be able to assist you with questions regarding bills from these companies. ° °You will be contacted with the lab results as soon as they are available. The fastest way to get your results is to activate your My Chart account. Instructions are located on the last page of this paperwork. If you have not heard from us regarding the results in 2 weeks, please contact this office. °  ° ° ° °

## 2019-09-06 LAB — MICROALBUMIN / CREATININE URINE RATIO
Creatinine, Urine: 328.9 mg/dL
Microalb/Creat Ratio: 102 mg/g creat — ABNORMAL HIGH (ref 0–29)
Microalbumin, Urine: 334.4 ug/mL

## 2019-09-14 ENCOUNTER — Other Ambulatory Visit: Payer: Self-pay | Admitting: Family Medicine

## 2019-09-16 ENCOUNTER — Other Ambulatory Visit: Payer: Self-pay | Admitting: Emergency Medicine

## 2019-09-16 DIAGNOSIS — E785 Hyperlipidemia, unspecified: Secondary | ICD-10-CM

## 2019-09-16 DIAGNOSIS — E119 Type 2 diabetes mellitus without complications: Secondary | ICD-10-CM

## 2019-09-26 ENCOUNTER — Other Ambulatory Visit: Payer: Self-pay | Admitting: Physician Assistant

## 2019-09-26 DIAGNOSIS — E119 Type 2 diabetes mellitus without complications: Secondary | ICD-10-CM

## 2019-10-09 ENCOUNTER — Other Ambulatory Visit: Payer: Self-pay | Admitting: Physician Assistant

## 2019-10-09 ENCOUNTER — Other Ambulatory Visit: Payer: Self-pay | Admitting: Emergency Medicine

## 2019-10-09 ENCOUNTER — Other Ambulatory Visit: Payer: Self-pay | Admitting: Family Medicine

## 2019-10-09 DIAGNOSIS — I10 Essential (primary) hypertension: Secondary | ICD-10-CM

## 2019-10-09 DIAGNOSIS — E119 Type 2 diabetes mellitus without complications: Secondary | ICD-10-CM

## 2019-10-09 DIAGNOSIS — E785 Hyperlipidemia, unspecified: Secondary | ICD-10-CM

## 2019-10-10 ENCOUNTER — Ambulatory Visit: Payer: 59 | Admitting: Internal Medicine

## 2019-11-02 ENCOUNTER — Other Ambulatory Visit: Payer: Self-pay

## 2019-11-04 ENCOUNTER — Ambulatory Visit (INDEPENDENT_AMBULATORY_CARE_PROVIDER_SITE_OTHER): Payer: 59 | Admitting: Internal Medicine

## 2019-11-04 ENCOUNTER — Encounter: Payer: Self-pay | Admitting: Internal Medicine

## 2019-11-04 ENCOUNTER — Other Ambulatory Visit: Payer: Self-pay | Admitting: Internal Medicine

## 2019-11-04 VITALS — BP 130/80 | HR 88 | Ht 68.5 in | Wt 233.0 lb

## 2019-11-04 DIAGNOSIS — E1142 Type 2 diabetes mellitus with diabetic polyneuropathy: Secondary | ICD-10-CM

## 2019-11-04 DIAGNOSIS — E7800 Pure hypercholesterolemia, unspecified: Secondary | ICD-10-CM | POA: Insufficient documentation

## 2019-11-04 DIAGNOSIS — E669 Obesity, unspecified: Secondary | ICD-10-CM

## 2019-11-04 DIAGNOSIS — Z6834 Body mass index (BMI) 34.0-34.9, adult: Secondary | ICD-10-CM

## 2019-11-04 DIAGNOSIS — E1165 Type 2 diabetes mellitus with hyperglycemia: Secondary | ICD-10-CM | POA: Diagnosis not present

## 2019-11-04 DIAGNOSIS — IMO0002 Reserved for concepts with insufficient information to code with codable children: Secondary | ICD-10-CM

## 2019-11-04 LAB — POCT GLYCOSYLATED HEMOGLOBIN (HGB A1C): Hemoglobin A1C: 9.7 % — AB (ref 4.0–5.6)

## 2019-11-04 MED ORDER — INSULIN PEN NEEDLE 32G X 4 MM MISC: 3 refills | Status: DC

## 2019-11-04 MED ORDER — TRESIBA FLEXTOUCH 200 UNIT/ML ~~LOC~~ SOPN: 14.0000 [IU] | PEN_INJECTOR | Freq: Every day | SUBCUTANEOUS | 3 refills | Status: DC

## 2019-11-04 NOTE — Telephone Encounter (Signed)
RX changed and sent.

## 2019-11-04 NOTE — Addendum Note (Signed)
Addended by: Cardell Peach I on: 11/04/2019 11:20 AM   Modules accepted: Orders

## 2019-11-04 NOTE — Telephone Encounter (Signed)
Yes - ok - take this at bedtime or after dinner.

## 2019-11-04 NOTE — Progress Notes (Signed)
Patient ID: Alexander [name], male   DOB: 09-Feb-1966, 53 y.o.   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 Loanne Drilling for this in 2014).  Last visit 5 months ago.  Reviewed his HbA1c levels: Lab Results  Component Value Date   HGBA1C 12.1 (H) 05/30/2019   HGBA1C 11.9 (A) 08/16/2018   HGBA1C 14.0 (A) 05/28/2018   HGBA1C 12.1 (H) 11/23/2017   HGBA1C 11.9 10/30/2016   HGBA1C 11.2 (H) 10/21/2016   HGBA1C 11.4 03/08/2016   HGBA1C 13.6 07/22/2015   HGBA1C 11.1 12/23/2014   HGBA1C 10.1 08/21/2014   Pt is on a regimen of: - Metformin 1000 mg 2x a day, with meals - Actos 15 mg daily  - Glipizide 10 mg 2x a day before meals - Ozempic 1 mg weekly - - recommended 05/2019 - discussed with DOT >> approved, but he did not start it...  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  He used the CGM before but ran out of sensors.  Pt checks his sugars 1-2x a day: - am: 200-250 >> 125, 175-250 >> 130, 150-175, 200 - 2h after b'fast: n/c - before lunch: n/c - 2h after lunch: n/c - before dinner: n/c - 2h after dinner: 200s >> 200-300 >> 200-225 - bedtime: n/c - nighttime: n/c Lowest sugar was 150 >> 125 >> 130; it is unclear at which CBG level he has hypoglycemia awareness Highest sugar was 280 >> 300 >> 200s.  Glucometer: ReliOn  Pt's meals are: - Breakfast: 2 eggs, 2 bacon pieces, OJ - Lunch: fast food, grilled fish + green beans, cabbage, corn, no bread, unsweet tea - Dinner: Fish or chicken + veggies - Snacks: fruit, nuts  -No CKD, last BUN/creatinine:  Lab Results  Component Value Date   BUN 16 05/30/2019   BUN 13 05/28/2018   CREATININE 0.89 05/30/2019   CREATININE 0.85 05/28/2018  On Cozaar 100.  -+ HL; last set of lipids: Lab Results   Component Value Date   CHOL 142 05/30/2019   HDL 63 05/30/2019   LDLCALC 59 05/30/2019   TRIG 100 05/30/2019   CHOLHDL 2.3 05/30/2019  On Zocor 40.  - last eye exam was in 02/2019: No DR.  - no numbness and tingling in his feet.  On ASA 81.  ROS: Constitutional: no weight gain/no weight loss, no fatigue, no subjective hyperthermia, no subjective hypothermia Eyes: no blurry vision, no xerophthalmia ENT: no sore throat, no nodules palpated in neck, no dysphagia, no odynophagia, no hoarseness Cardiovascular: no CP/no SOB/no palpitations/no leg swelling Respiratory: no cough/no SOB/no wheezing Gastrointestinal: no N/no V/no D/no C/no acid reflux Musculoskeletal: no muscle aches/no joint aches Skin: no rashes, no hair loss Neurological: no tremors/no numbness/no tingling/no dizziness  I reviewed pt's medications, allergies, PMH, social hx, family hx, and changes were documented in the history of present illness. Otherwise, unchanged from my initial visit note.  Past Medical History:  Diagnosis Date  . Diabetes mellitus without complication ([place])   . Hyperlipidemia   . Hypertension    Past Surgical History:  Procedure Laterality Date  . I&D EXTREMITY Left 11/24/2017   Procedure: IRRIGATION AND DEBRIDEMENT LEFT THIGH ABSCESS;  Surgeon: Erroll Luna, MD;  Location: [place];  Service: General;  Laterality: Left;  . ORIF HIP FRACTURE Right    Social History   Socioeconomic History  . Marital status: Married    Spouse name: Thayer Headings  . Number of children: 1  . Years of education: college  . Highest education level: Not on file  Occupational History  . Occupation: Self employed  Social Needs  . Financial resource strain: Not on file  . Food insecurity    Worry: Not on file    Inability: Not on file  . Transportation needs    Medical: Not on file    Non-medical: Not on file  Tobacco Use  . Smoking status: Never Smoker  . Smokeless tobacco: Never Used  Substance and  Sexual Activity  . Alcohol use: Yes    Alcohol/week: 2.0 standard drinks    Types: 2 Standard drinks or equivalent per week    Comment: social   . Drug use: No  . Sexual activity: Yes    Partners: Female  Lifestyle  . Physical activity    Days per week: Not on file    Minutes per session: Not on file  . Stress: Not on file  Relationships  . Social Herbalist on phone: Not on file    Gets together: Not on file    Attends religious service: Not on file    Active member of club or organization: Not on file    Attends meetings of clubs or organizations: Not on file    Relationship status: Not on file  . Intimate partner violence    Fear of current or ex partner: Not on file    Emotionally abused: Not on file    Physically abused: Not on file    Forced sexual activity: Not on file  Other Topics Concern  . Not on file  Social History Narrative   Lives with wife.     Current Outpatient Medications on File Prior to Visit  Medication Sig Dispense Refill  . aspirin 81 MG tablet Take 1 tablet (81 mg total) by mouth every other day. 30 tablet 11  . clotrimazole (LOTRIMIN) 1 % cream APPLY TO AFFECTED AREA TWICE A DAY *NOT COVERED* 30 g 0  . Continuous Blood Gluc Sensor (FREESTYLE LIBRE 14 DAY SENSOR) MISC 1 each by Does not apply route every 14 (fourteen) days. Change every 2 weeks 2 each 11  . glipiZIDE (GLUCOTROL) 10 MG tablet TAKE 2 TABLETS BY MOUTH 2  TIMES DAILY BEFORE MEALS 360 tablet 3  . Insulin Degludec (TRESIBA FLEXTOUCH) 200 UNIT/ML SOPN Inject 14 Units into the skin daily. 3 pen 3  . Insulin Pen Needle 32G X 4 MM MISC Use 1x a day 100 each 3  . losartan (COZAAR) 100 MG tablet TAKE 1 TABLET BY MOUTH  DAILY 90 tablet 3  . metFORMIN (GLUCOPHAGE) 1000 MG tablet TAKE 1 TABLET BY MOUTH TWICE A DAY WITH MEALS 60 tablet 5  . Multiple Vitamin (MULTIVITAMIN) tablet Take 1 tablet by mouth daily.    . pioglitazone (ACTOS) 15 MG tablet TAKE 1 TABLET BY MOUTH  DAILY 90 tablet 0   . Semaglutide, 1 MG/DOSE, (OZEMPIC, 1 MG/DOSE,) 2 MG/1.5ML SOPN Inject 1 mg into the skin once a week. 2 pen 5  . sildenafil (VIAGRA) 100 MG tablet TAKE 1 TABLET BY MOUTH AS  NEEDED FOR ERECTILE  DYSFUNCTION AS DIRECTED 3 tablet 0  . simvastatin (ZOCOR) 40 MG tablet TAKE 1 TABLET BY MOUTH  DAILY 90 tablet 3  . [  DISCONTINUED] sildenafil (VIAGRA) 100 MG tablet TAKE 1 TABLET BY MOUTH AS  NEEDED FOR ERECTILE  DYSFUNCTION AS DIRECTED 4 tablet 0   No current facility-administered medications on file prior to visit.    No Known Allergies Family History  Problem Relation Age of Onset  . Diabetes Father   . Heart attack Father 27       Died age 31 MI    PE: BP 130/80   Pulse 88   Ht 5' 8.5" (1.74 m)   Wt 233 lb (105.7 kg)   SpO2 96%   BMI 34.91 kg/m  Wt Readings from Last 3 Encounters:  11/04/19 233 lb (105.7 kg)  09/05/19 230 lb (104.3 kg)  07/04/19 232 lb (105.2 kg)   Constitutional: overweight, in NAD Eyes: PERRLA, EOMI, no exophthalmos ENT: moist mucous membranes, no thyromegaly, no cervical lymphadenopathy Cardiovascular: RRR, No MRG Respiratory: CTA B Gastrointestinal: abdomen soft, NT, ND, BS+ Musculoskeletal: no deformities, strength intact in all 4 Skin: moist, warm, no rashes Neurological: no tremor with outstretched hands, DTR normal in all 4  ASSESSMENT: 1. DM2, non-insulin-dependent, uncontrolled, with long-term complications - PN - ED  2. HL  3. Obesity  PLAN:  1. Patient with longstanding diabetes, on oral antidiabetic regimen, and also GLP-1 agonist and long-acting insulin, with still poor control.  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 -At last visit, sugars were very high and we discussed about possible complications from his many years of uncontrolled diabetes and the fact that he absolutely needs long-acting insulin.  I suggested to add Tresiba at that time and check with the DOT if we can do this.  In the meantime, I advised him to  increase Ozempic.  At this visit he tells me that he did increase Ozempic but he did not start [place], despite the fact that he did discuss with DOT and the insulin was approved -At this visit, his sugars are better, but still above goal.  I again suggested to add to [place], but at a lower dose.  I advised him how to titrate the dose based on morning sugars.  We will continue the rest of the regimen. - I suggested to:  Patient Instructions  Please continue: - Metformin 1000 mg 2x a day, with meals - Actos 15 mg daily  - Glipizide 10 mg 2x a day before meals - Ozempic 1 mg weekly  Start: - Tresiba 10 units daily - increase by 2 units every 4 days if sugars in the am >140  Please come back for a follow-up appointment in 3 months.    - we checked his HbA1c: 9.7% (better) - advised to check sugars at different times of the day - 1-2x a day, rotating check times - advised for yearly eye exams >> he is UTD - refuses a flu shot - return to clinic in 3 months  2. HL -Reviewed latest lipid panel from  05/2019: Lab Results  Component Value Date   CHOL 142 05/30/2019   HDL 63 05/30/2019   LDLCALC 59 05/30/2019   TRIG 100 05/30/2019   CHOLHDL 2.3 05/30/2019  -Continues Zocor without side effects  3. Obesity - no signif. Weight loss since last OV -Continue Ozempic which should also help with weight loss -I am hoping we can stop Actos and glipizide in the future since they are both contributing to weight gain  Philemon Kingdom, MD PhD [facility]

## 2019-11-04 NOTE — Patient Instructions (Addendum)
Please continue: - Metformin 1000 mg 2x a day, with meals - Actos 15 mg daily  - Glipizide 10 mg 2x a day before meals - Ozempic 1 mg weekly  Start: - Tresiba 10 units daily - increase by 2 units every 4 days if sugars in the am >140  Please come back for a follow-up appointment in 3 months.

## 2019-11-04 NOTE — Telephone Encounter (Signed)
Alexander [name] not covered, ok to switch to Lantus?

## 2020-01-11 ENCOUNTER — Other Ambulatory Visit: Payer: Self-pay

## 2020-01-11 MED ORDER — OZEMPIC (1 MG/DOSE) 2 MG/1.5ML ~~LOC~~ SOPN: 1.0000 mg | PEN_INJECTOR | SUBCUTANEOUS | 1 refills | Status: DC

## 2020-01-25 ENCOUNTER — Other Ambulatory Visit: Payer: Self-pay | Admitting: Family Medicine

## 2020-01-25 DIAGNOSIS — E119 Type 2 diabetes mellitus without complications: Secondary | ICD-10-CM

## 2020-01-30 LAB — HM DIABETES EYE EXAM

## 2020-02-02 ENCOUNTER — Other Ambulatory Visit: Payer: Self-pay

## 2020-02-06 ENCOUNTER — Ambulatory Visit (INDEPENDENT_AMBULATORY_CARE_PROVIDER_SITE_OTHER): Payer: 59 | Admitting: Internal Medicine

## 2020-02-06 ENCOUNTER — Other Ambulatory Visit: Payer: Self-pay

## 2020-02-06 ENCOUNTER — Encounter: Payer: Self-pay | Admitting: Internal Medicine

## 2020-02-06 VITALS — BP 126/88 | HR 105 | Ht 68.5 in | Wt 234.0 lb

## 2020-02-06 DIAGNOSIS — E1165 Type 2 diabetes mellitus with hyperglycemia: Secondary | ICD-10-CM | POA: Diagnosis not present

## 2020-02-06 DIAGNOSIS — Z6834 Body mass index (BMI) 34.0-34.9, adult: Secondary | ICD-10-CM

## 2020-02-06 DIAGNOSIS — E669 Obesity, unspecified: Secondary | ICD-10-CM

## 2020-02-06 DIAGNOSIS — E1142 Type 2 diabetes mellitus with diabetic polyneuropathy: Secondary | ICD-10-CM | POA: Diagnosis not present

## 2020-02-06 DIAGNOSIS — E7800 Pure hypercholesterolemia, unspecified: Secondary | ICD-10-CM

## 2020-02-06 DIAGNOSIS — IMO0002 Reserved for concepts with insufficient information to code with codable children: Secondary | ICD-10-CM

## 2020-02-06 LAB — POCT GLYCOSYLATED HEMOGLOBIN (HGB A1C): Hemoglobin A1C: 10.8 % — AB (ref 4.0–5.6)

## 2020-02-06 MED ORDER — INSULIN PEN NEEDLE 32G X 4 MM MISC: 3 refills | Status: DC

## 2020-02-06 MED ORDER — LANTUS SOLOSTAR 100 UNIT/ML ~~LOC~~ SOPN: 14.0000 [IU] | PEN_INJECTOR | Freq: Every day | SUBCUTANEOUS | 4 refills | Status: DC

## 2020-02-06 NOTE — Addendum Note (Signed)
Addended by: Cardell Peach I on: 02/06/2020 10:08 AM   Modules accepted: Orders

## 2020-02-06 NOTE — Progress Notes (Signed)
Patient ID: Alexander [name], male   DOB: 07-Sep-1966, 54 y.o.   MRN: [id number]   This visit occurred during the SARS-CoV-2 public health emergency.  Safety protocols were in place, including screening questions prior to the visit, additional usage of staff PPE, and extensive cleaning of exam room while observing appropriate contact time as indicated for disinfecting solutions.   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 Loanne Drilling for this in 2014).  Last visit 3 months ago.  At this visit, he tells me that he still did not start insulin despite repeated advice to do so...  Reviewed HbA1c levels: Lab Results  Component Value Date   HGBA1C 9.7 (A) 11/04/2019   HGBA1C 12.1 (H) 05/30/2019   HGBA1C 11.9 (A) 08/16/2018   HGBA1C 14.0 (A) 05/28/2018   HGBA1C 12.1 (H) 11/23/2017   HGBA1C 11.9 10/30/2016   HGBA1C 11.2 (H) 10/21/2016   HGBA1C 11.4 03/08/2016   HGBA1C 13.6 07/22/2015   HGBA1C 11.1 12/23/2014   Pt is on a regimen of: - Metformin 1000 mg 2x a day, with meals - Actos 15 mg daily  - Glipizide 10 mg 2x a day before meals - Ozempic 1 mg weekly - - recommended 05/2019 - discussed with DOT >> approved, but he did not start it... I again recommended this in 10/2019 >> did not start!!!! 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  He used the CGM before but ran out of sensors.  Pt checks his sugars 1-2 times a day: - am: 200-250 >> 125, 175-250 >> 130, 150-175, 200 >> 100-180 - 2h after b'fast: n/c - before lunch: n/c - 2h after lunch: n/c - before dinner: n/c - 2h after dinner: 200s >> 200-300 >> 200-225 >> 180-200s - bedtime: n/c - nighttime: n/c Lowest sugar was 150 >> 125 >> 130 >> 100; it is unclear at which level he has hypoglycemia  unawareness Highest sugar was 280 >> 300 >> 200s >> 200s.  Glucometer: ReliOn  Pt's meals are: - Breakfast: 2 eggs, 2 bacon pieces, OJ - Lunch: fast food, grilled fish + green beans, cabbage, corn, no bread, unsweet tea - Dinner: Fish or chicken + veggies - Snacks: fruit, nuts  -No CKD, last BUN/creatinine:  Lab Results  Component Value Date   BUN 16 05/30/2019   BUN 13 05/28/2018   CREATININE 0.89 05/30/2019   CREATININE 0.85 05/28/2018  On Cozaar 100.  -+ HL; last set of lipids: Lab Results  Component Value Date   CHOL 142 05/30/2019   HDL 63 05/30/2019   LDLCALC 59 05/30/2019   TRIG 100 05/30/2019   CHOLHDL 2.3 05/30/2019  On Zocor 40.  - last eye exam was in 01/2020: No DR  -No numbness and tingling in his feet.  On ASA 81.  ROS: Constitutional: no weight gain/no weight loss, no fatigue, no subjective hyperthermia, no subjective hypothermia Eyes: no blurry vision, no xerophthalmia ENT: no sore throat, no nodules palpated in neck, no dysphagia, no odynophagia, no hoarseness Cardiovascular: no CP/no SOB/no palpitations/no leg swelling Respiratory: no cough/no SOB/no wheezing Gastrointestinal: no N/no V/no D/no C/no acid reflux Musculoskeletal: no muscle aches/no joint aches Skin: no rashes, no hair loss Neurological: no tremors/no numbness/no tingling/no dizziness  I reviewed pt's medications, allergies,  PMH, social hx, family hx, and changes were documented in the history of present illness. Otherwise, unchanged from my initial visit note.  Past Medical History:  Diagnosis Date  . Diabetes mellitus without complication ([place])   . Hyperlipidemia   . Hypertension    Past Surgical History:  Procedure Laterality Date  . I & D EXTREMITY Left 11/24/2017   Procedure: IRRIGATION AND DEBRIDEMENT LEFT THIGH ABSCESS;  Surgeon: Erroll Luna, MD;  Location: [place];  Service: General;  Laterality: Left;  . ORIF HIP FRACTURE Right    Social History   Socioeconomic  History  . Marital status: Married    Spouse name: Thayer Headings  . Number of children: 1  . Years of education: college  . Highest education level: Not on file  Occupational History  . Occupation: Self employed  Tobacco Use  . Smoking status: Never Smoker  . Smokeless tobacco: Never Used  Substance and Sexual Activity  . Alcohol use: Yes    Alcohol/week: 2.0 standard drinks    Types: 2 Standard drinks or equivalent per week    Comment: social   . Drug use: No  . Sexual activity: Yes    Partners: Female  Other Topics Concern  . Not on file  Social History Narrative   Lives with wife.     Social Determinants of Health   Financial Resource Strain:   . Difficulty of Paying Living Expenses: Not on file  Food Insecurity:   . Worried About Charity fundraiser in the Last Year: Not on file  . Ran Out of Food in the Last Year: Not on file  Transportation Needs:   . Lack of Transportation (Medical): Not on file  . Lack of Transportation (Non-Medical): Not on file  Physical Activity:   . Days of Exercise per Week: Not on file  . Minutes of Exercise per Session: Not on file  Stress:   . Feeling of Stress : Not on file  Social Connections:   . Frequency of Communication with Friends and Family: Not on file  . Frequency of Social Gatherings with Friends and Family: Not on file  . Attends Religious Services: Not on file  . Active Member of Clubs or Organizations: Not on file  . Attends Archivist Meetings: Not on file  . Marital Status: Not on file  Intimate Partner Violence:   . Fear of Current or Ex-Partner: Not on file  . Emotionally Abused: Not on file  . Physically Abused: Not on file  . Sexually Abused: Not on file   Current Outpatient Medications on File Prior to Visit  Medication Sig Dispense Refill  . aspirin 81 MG tablet Take 1 tablet (81 mg total) by mouth every other day. 30 tablet 11  . clotrimazole (LOTRIMIN) 1 % cream APPLY TO AFFECTED AREA TWICE A DAY *NOT  COVERED* 30 g 0  . Continuous Blood Gluc Sensor (FREESTYLE LIBRE 14 DAY SENSOR) MISC 1 each by Does not apply route every 14 (fourteen) days. Change every 2 weeks 2 each 11  . glipiZIDE (GLUCOTROL) 10 MG tablet TAKE 2 TABLETS BY MOUTH 2  TIMES DAILY BEFORE MEALS 360 tablet 3  . Insulin Glargine (LANTUS SOLOSTAR) 100 UNIT/ML Solostar Pen Inject 14 Units into the skin at bedtime. 15 mL 4  . Insulin Pen Needle 32G X 4 MM MISC Use 1x a day 100 each 3  . losartan (COZAAR) 100 MG tablet TAKE 1 TABLET BY MOUTH  DAILY 90 tablet 3  .  metFORMIN (GLUCOPHAGE) 1000 MG tablet TAKE 1 TABLET BY MOUTH TWICE A DAY WITH MEALS 60 tablet 5  . Multiple Vitamin (MULTIVITAMIN) tablet Take 1 tablet by mouth daily.    . pioglitazone (ACTOS) 15 MG tablet TAKE 1 TABLET BY MOUTH  DAILY 90 tablet 0  . Semaglutide, 1 MG/DOSE, (OZEMPIC, 1 MG/DOSE,) 2 MG/1.5ML SOPN Inject 1 mg into the skin once a week. 6 pen 1  . sildenafil (VIAGRA) 100 MG tablet TAKE 1 TABLET BY MOUTH AS  NEEDED FOR ERECTILE  DYSFUNCTION AS DIRECTED 3 tablet 0  . simvastatin (ZOCOR) 40 MG tablet TAKE 1 TABLET BY MOUTH  DAILY 90 tablet 3  . [DISCONTINUED] sildenafil (VIAGRA) 100 MG tablet TAKE 1 TABLET BY MOUTH AS  NEEDED FOR ERECTILE  DYSFUNCTION AS DIRECTED 4 tablet 0   No current facility-administered medications on file prior to visit.   No Known Allergies Family History  Problem Relation Age of Onset  . Diabetes Father   . Heart attack Father 57       Died age 68 MI    PE: BP 126/88   Pulse (!) 105   Ht 5' 8.5" (1.74 m)   Wt 234 lb (106.1 kg)   SpO2 98%   BMI 35.06 kg/m  Wt Readings from Last 3 Encounters:  02/06/20 234 lb (106.1 kg)  11/04/19 233 lb (105.7 kg)  09/05/19 230 lb (104.3 kg)   Constitutional: overweight, in NAD Eyes: PERRLA, EOMI, no exophthalmos ENT: moist mucous membranes, no thyromegaly, no cervical lymphadenopathy Cardiovascular: tachycardia, RR, No MRG Respiratory: CTA B Gastrointestinal: abdomen soft, NT, ND,  BS+ Musculoskeletal: no deformities, strength intact in all 4 Skin: moist, warm, no rashes Neurological: no tremor with outstretched hands, DTR normal in all 4  ASSESSMENT: 1. DM2, insulin-dependent, uncontrolled, with long-term complications - PN - ED  2. HL  3. Obesity class I  PLAN:  1. Patient with longstanding diabetes, on oral antidiabetic regimen, with Metformin, sulfonylurea, and TZD, also on weekly GLP-1 receptor agonist with persistently elevated high blood sugars (latest HbA1c was improved at 9.7%).   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  However, this was approved by DOT and I suggested insulin at last 2 visits.  At this visit, he again returns he mentions that he is not on insulin.  At this time, there was a problem with the pharmacy.  I advised him that he should have blood test now to send the prescription to the other pharmacy.   -At this visit, sugars remain high, and unfortunately, they are not high enough to require insulin at this visit, we will try again to add insulin but I advised him that if he does not take this when he comes back, I cannot help him. - I suggested to:  Patient Instructions  Please continue: - Metformin 1000 mg 2x a day, with meals - Actos 15 mg daily  - Glipizide 10 mg 2x a day before meals - Ozempic 1 mg weekly  Start: - Lantus 10 units daily - increase by 2 units every 4 days if sugars in the am >130  Please come back for a follow-up appointment in 3 months.  - we checked his HbA1c: 10.8% (higher) - advised to check sugars at different times of the day - 1-2x a day, rotating check times - advised for yearly eye exams >> he is UTD - return to clinic in 3 months  2. HL -Reviewed latest lipid panel from  05/2019: All fractions at goal Lab Results  Component Value Date   CHOL 142 05/30/2019   HDL 63 05/30/2019   LDLCALC 59 05/30/2019   TRIG 100 05/30/2019   CHOLHDL 2.3 05/30/2019  -Continues Zocor without side  effects  3. Obesity class I -Continue Ozempic which should also help with weight loss -I am hoping we can stop Actos and glipizide in the future since they are both weight inducing -Unfortunately, we need to start insulin, which is also weight inducing -gained 1 pound since last visit   Philemon Kingdom, MD PhD [facility]

## 2020-02-06 NOTE — Patient Instructions (Signed)
Please continue: - Metformin 1000 mg 2x a day, with meals - Actos 15 mg daily  - Glipizide 10 mg 2x a day before meals - Ozempic 1 mg weekly  Start: - Lantus 10 units daily - increase by 2 units every 4 days if sugars in the am >130  Please come back for a follow-up appointment in 3 months.

## 2020-02-19 ENCOUNTER — Other Ambulatory Visit: Payer: Self-pay | Admitting: Family Medicine

## 2020-02-19 DIAGNOSIS — E785 Hyperlipidemia, unspecified: Secondary | ICD-10-CM

## 2020-02-19 DIAGNOSIS — E119 Type 2 diabetes mellitus without complications: Secondary | ICD-10-CM

## 2020-03-19 ENCOUNTER — Encounter: Payer: Self-pay | Admitting: Family Medicine

## 2020-03-19 ENCOUNTER — Ambulatory Visit (INDEPENDENT_AMBULATORY_CARE_PROVIDER_SITE_OTHER): Payer: 59 | Admitting: Family Medicine

## 2020-03-19 ENCOUNTER — Other Ambulatory Visit: Payer: Self-pay

## 2020-03-19 VITALS — BP 108/80 | HR 91 | Temp 96.6°F | Ht 68.6 in | Wt 230.0 lb

## 2020-03-19 DIAGNOSIS — E1165 Type 2 diabetes mellitus with hyperglycemia: Secondary | ICD-10-CM | POA: Diagnosis not present

## 2020-03-19 DIAGNOSIS — I5021 Acute systolic (congestive) heart failure: Secondary | ICD-10-CM | POA: Diagnosis not present

## 2020-03-19 DIAGNOSIS — I10 Essential (primary) hypertension: Secondary | ICD-10-CM | POA: Diagnosis not present

## 2020-03-19 DIAGNOSIS — E785 Hyperlipidemia, unspecified: Secondary | ICD-10-CM | POA: Diagnosis not present

## 2020-03-19 NOTE — Patient Instructions (Signed)
     If you have lab work done today you will be contacted with your lab results within the next 2 weeks.  If you have not heard from us then please contact us. The fastest way to get your results is to register for My Chart.   IF you received an x-ray today, you will receive an invoice from [facility]. Please contact [facility] at [phone] with questions or concerns regarding your invoice.   IF you received labwork today, you will receive an invoice from [facility]. Please contact [facility] at [phone] with questions or concerns regarding your invoice.   Our billing staff will not be able to assist you with questions regarding bills from these companies.  You will be contacted with the lab results as soon as they are available. The fastest way to get your results is to activate your My Chart account. Instructions are located on the last page of this paperwork. If you have not heard from us regarding the results in 2 weeks, please contact this office.        If you have lab work done today you will be contacted with your lab results within the next 2 weeks.  If you have not heard from us then please contact us. The fastest way to get your results is to register for My Chart.   IF you received an x-ray today, you will receive an invoice from [facility]. Please contact  Radiology at [phone] with questions or concerns regarding your invoice.   IF you received labwork today, you will receive an invoice from [facility]. Please contact [facility] at [phone] with questions or concerns regarding your invoice.   Our billing staff will not be able to assist you with questions regarding bills from these companies.  You will be contacted with the lab results as soon as they are available. The fastest way to get your results is to activate your My Chart account. Instructions are located on the last page of this paperwork. If you have not heard from us  regarding the results in 2 weeks, please contact this office.     

## 2020-03-19 NOTE — Progress Notes (Signed)
3/29/20218:17 AM  Tyler Deis [birth year], 54 y.o., male [id number]  Chief Complaint  Patient presents with  . Follow-up    went to er in [place] for sob, told he needs cards and to discuss meds    HPI:   Patient is a 54 y.o. male with past medical history significant for HTN and DM2 who presents today for hosp followup  Last OV with me in sept 2020 Last endo visit feb 2021  Went to ER on March 11th 2021 for SOB that had been getting progressively worse over the past several months Had a televisit days prior - given albuterol which helped some SOB got worse, could not sleep, and went to ER No chest pain, no fever or chills, edema He did have mild dry cough He did have nausea and vomiting  Patient states he was hospitalized until march 14th Per discharge instructions given to patient: Discharge diagnosis: 1. Dyspnea 2. RLL PNA 3. Acute CHF, per patient his EF was 25-20% 4. Transaminitis 5. Type 2 DM 6. Hyperglycemia 7. Hypertension  Started on ASA 81, carvedilol 6.54m BID, furosemide 230m Spironolactone 2564mtrovastatin 5m48manged to simvastatin 40mg93mcompleted antiobiotic  He feels back to normal self Breathing back to normal Tolerating new meds wo any issues He reports neg covid   He reports that his cbg this morning 314, he reports ate dinner very late last night He is taking lantus 14 units  [facility] 09/05/2019 05/30/2019  Decreased Interest 0 0 0  Down, Depressed, Hopeless 0 0 0  PHQ - 2 Score 0 0 0    Fall Risk  03/19/2020 09/05/2019 05/30/2019 05/28/2018 11/27/2017  Falls in the past year? 0 0 0 No No  Number falls in past yr: 0 0 0 - -  Injury with Fall? 0 0 0 - -     No Known Allergies  Prior to Admission medications   Medication Sig Start Date End Date Taking? Authorizing Provider  albuterol (VENTOLIN HFA) 108 (90 Base) MCG/ACT inhaler INHALE 2 PUFFS BY MOUTH FOUR TIMES DAILY FOR 7 DAYS THEN AS NEEDED 01/15/20    [provider]  aspirin 81 MG tablet Take 1 tablet (81 mg total) by mouth every other day. 05/28/18   McVey, ElizaGelene MinkC  BAYER LOW DOSE 81 MG chewable tablet Chew 81 mg by mouth daily. 03/01/20   [provider]  carvedilol (COREG) 6.25 MG tablet Take 6.25 mg by mouth 2 (two) times daily. 03/01/20   [provider]  cefpodoxime (VANTIN) 200 MG tablet SMARTSIG:2 Tablet(s) By Mouth Every 12 Hours 03/01/20   [provider]  clotrimazole (LOTRIMIN) 1 % cream APPLY TO AFFECTED AREA TWICE A DAY *NOT COVERED* 07/05/18   McVey, ElizaGelene MinkC  furosemide (LASIX) 20 MG tablet Take 20 mg by mouth daily. 03/01/20   [provider]  glipiZIDE (GLUCOTROL) 10 MG tablet TAKE 2 TABLETS BY MOUTH 2  TIMES DAILY BEFORE MEALS 09/27/19   SantiRutherford Guys Insulin Glargine (LANTUS SOLOSTAR) 100 UNIT/ML Solostar Pen Inject 14 Units into the skin at bedtime. 02/06/20   GhergPhilemon Kingdom Insulin Pen Needle 32G X 4 MM MISC Use 1x a day 02/06/20   GhergPhilemon Kingdom losartan (COZAAR) 100 MG tablet TAKE 1 TABLET BY MOUTH  DAILY 10/10/19   McVey, ElizaGelene MinkC  metFORMIN (GLUCOPHAGE) 1000 MG tablet TAKE 1 TABLET BY MOUTH  TWICE DAILY WITH MEALS 02/19/20  Rutherford Guys, MD  Multiple Vitamin (MULTIVITAMIN) tablet Take 1 tablet by mouth daily.    [provider]  pioglitazone (ACTOS) 15 MG tablet TAKE 1 TABLET BY MOUTH  DAILY 01/25/20   Rutherford Guys, MD  Semaglutide, 1 MG/DOSE, (OZEMPIC, 1 MG/DOSE,) 2 MG/1.5ML SOPN Inject 1 mg into the skin once a week. 01/11/20   Philemon Kingdom, MD  sildenafil (VIAGRA) 100 MG tablet TAKE 1 TABLET BY MOUTH AS  NEEDED FOR ERECTILE  DYSFUNCTION AS DIRECTED 10/10/19   Rutherford Guys, MD  simvastatin (ZOCOR) 40 MG tablet TAKE 1 TABLET BY MOUTH  DAILY 09/14/19   Rutherford Guys, MD  sildenafil (VIAGRA) 100 MG tablet TAKE 1 TABLET BY MOUTH AS  NEEDED FOR ERECTILE  DYSFUNCTION AS DIRECTED 06/20/19   Maximiano Coss, NP    Past Medical History:  Diagnosis Date  . Diabetes mellitus without complication ([place])   . Hyperlipidemia   . Hypertension     Past Surgical History:  Procedure Laterality Date  . I & D EXTREMITY Left 11/24/2017   Procedure: IRRIGATION AND DEBRIDEMENT LEFT THIGH ABSCESS;  Surgeon: Erroll Luna, MD;  Location: [place];  Service: General;  Laterality: Left;  . ORIF HIP FRACTURE Right     Social History   Tobacco Use  . Smoking status: Never Smoker  . Smokeless tobacco: Never Used  Substance Use Topics  . Alcohol use: Yes    Alcohol/week: 2.0 standard drinks    Types: 2 Standard drinks or equivalent per week    Comment: social     Family History  Problem Relation Age of Onset  . Diabetes Father   . Heart attack Father 34       Died age 81 MI    Review of Systems  Constitutional: Negative for chills and fever.  Respiratory: Negative for cough and shortness of breath.   Cardiovascular: Negative for chest pain, palpitations and leg swelling.  Gastrointestinal: Negative for abdominal pain, nausea and vomiting.     OBJECTIVE:  Today's Vitals   03/19/20 0811  BP: 108/80  Pulse: 91  Temp: (!) 96.6 F (35.9 C)  SpO2: 98%  Weight: 230 lb (104.3 kg)  Height: 5' 8.6" (1.742 m)   Body mass index is 34.36 kg/m.  Wt Readings from Last 3 Encounters:  03/19/20 230 lb (104.3 kg)  02/06/20 234 lb (106.1 kg)  11/04/19 233 lb (105.7 kg)    Physical Exam Vitals and nursing note reviewed.  Constitutional:      Appearance: He is well-developed.  HENT:     Head: Normocephalic and atraumatic.  Eyes:     Conjunctiva/sclera: Conjunctivae normal.     Pupils: Pupils are equal, round, and reactive to light.  Cardiovascular:     Rate and Rhythm: Normal rate and regular rhythm.     Heart sounds: No murmur. No friction rub. No gallop.   Pulmonary:     Effort: Pulmonary effort is normal.     Breath sounds: Normal breath sounds. No wheezing, rhonchi or rales.   Musculoskeletal:     Cervical back: Neck supple.     Right lower leg: No edema.     Left lower leg: No edema.  Skin:    General: Skin is warm and dry.  Neurological:     Mental Status: He is alert and oriented to person, place, and time.     No results found for this or any previous visit (from the past 24 hour(s)).  No results  found.   ASSESSMENT and PLAN  1. Type 2 diabetes mellitus with hyperglycemia, without long-term current use of insulin (HCC) Managed by endo, last A1c in feb > 10. Cont with lantus titration. Cont with diet and weight loss efforts - Lipid panel - CMP14+EGFR - TSH  2. Essential hypertension Controlled. Continue current regime.  - Lipid panel - CMP14+EGFR  3. Hyperlipidemia, unspecified hyperlipidemia type Checking labs today, medications will be adjusted as needed.  - Lipid panel - TFT73+UKGU  4. Acute systolic congestive heart failure ([place]) euvolemic in clinic. Cont low salt, fluid restriction. Hosp records being requested. Referring to cards for further eval and treatment.  - Ambulatory referral to Cardiology  Other orders - albuterol (VENTOLIN HFA) 108 (90 Base) MCG/ACT inhaler; INHALE 2 PUFFS BY MOUTH FOUR TIMES DAILY FOR 7 DAYS THEN AS NEEDED - BAYER LOW DOSE 81 MG chewable tablet; Chew 81 mg by mouth daily. - carvedilol (COREG) 6.25 MG tablet; Take 6.25 mg by mouth 2 (two) times daily. - furosemide (LASIX) 20 MG tablet; Take 20 mg by mouth daily.  Return in about 3 months (around 06/19/2020).    Rutherford Guys, MD [facility] at [place], [place] [address] Ph.  [phone] Fax [phone]

## 2020-03-20 LAB — CMP14+EGFR
ALT: 13 IU/L (ref 0–44)
AST: 15 IU/L (ref 0–40)
Albumin/Globulin Ratio: 1.4 (ref 1.2–2.2)
Albumin: 4 g/dL (ref 3.8–4.9)
Alkaline Phosphatase: 71 IU/L (ref 39–117)
BUN/Creatinine Ratio: 19 (ref 9–20)
BUN: 16 mg/dL (ref 6–24)
Bilirubin Total: 0.6 mg/dL (ref 0.0–1.2)
CO2: 21 mmol/L (ref 20–29)
Calcium: 9.2 mg/dL (ref 8.7–10.2)
Chloride: 103 mmol/L (ref 96–106)
Creatinine, Ser: 0.85 mg/dL (ref 0.76–1.27)
GFR calc Af Amer: 113 mL/min/{1.73_m2} (ref >59)
GFR calc non Af Amer: 98 mL/min/{1.73_m2} (ref >59)
Globulin, Total: 2.9 g/dL (ref 1.5–4.5)
Glucose: 289 mg/dL — ABNORMAL HIGH (ref 65–99)
Potassium: 4.8 mmol/L (ref 3.5–5.2)
Sodium: 137 mmol/L (ref 134–144)
Total Protein: 6.9 g/dL (ref 6.0–8.5)

## 2020-03-20 LAB — LIPID PANEL
Chol/HDL Ratio: 2 ratio (ref 0.0–5.0)
Cholesterol, Total: 135 mg/dL (ref 100–199)
HDL: 66 mg/dL (ref >39)
LDL Chol Calc (NIH): 47 mg/dL (ref 0–99)
Triglycerides: 126 mg/dL (ref 0–149)
VLDL Cholesterol Cal: 22 mg/dL (ref 5–40)

## 2020-03-20 LAB — TSH: TSH: 1.55 u[IU]/mL (ref 0.450–4.500)

## 2020-03-22 DIAGNOSIS — I5020 Unspecified systolic (congestive) heart failure: Secondary | ICD-10-CM

## 2020-03-22 HISTORY — DX: Unspecified systolic (congestive) heart failure: I50.20

## 2020-03-30 ENCOUNTER — Other Ambulatory Visit: Payer: Self-pay

## 2020-03-30 ENCOUNTER — Other Ambulatory Visit: Payer: Self-pay | Admitting: Family Medicine

## 2020-03-30 ENCOUNTER — Telehealth: Payer: Self-pay | Admitting: Cardiology

## 2020-03-30 DIAGNOSIS — I10 Essential (primary) hypertension: Secondary | ICD-10-CM

## 2020-03-30 DIAGNOSIS — I5021 Acute systolic (congestive) heart failure: Secondary | ICD-10-CM

## 2020-03-30 MED ORDER — CARVEDILOL 6.25 MG PO TABS: 6.2500 mg | ORAL_TABLET | Freq: Two times a day (BID) | ORAL | 0 refills | Status: DC

## 2020-03-30 MED ORDER — SPIRONOLACTONE 25 MG PO TABS: 25.0000 mg | ORAL_TABLET | Freq: Every day | ORAL | 0 refills | Status: DC

## 2020-03-30 MED ORDER — LANTUS SOLOSTAR 100 UNIT/ML ~~LOC~~ SOPN: 14.0000 [IU] | PEN_INJECTOR | Freq: Every day | SUBCUTANEOUS | 4 refills | Status: DC

## 2020-03-30 MED ORDER — FUROSEMIDE 20 MG PO TABS: 20.0000 mg | ORAL_TABLET | Freq: Every day | ORAL | 0 refills | Status: DC

## 2020-03-30 NOTE — Telephone Encounter (Signed)
Pt needs refills on carvedilol, furosemide 20 mg and pt was given spironolactone 25 mg in hospital and needs refills. [facility] in [place]

## 2020-04-02 ENCOUNTER — Ambulatory Visit (INDEPENDENT_AMBULATORY_CARE_PROVIDER_SITE_OTHER): Payer: 59 | Admitting: Cardiology

## 2020-04-02 ENCOUNTER — Other Ambulatory Visit: Payer: Self-pay

## 2020-04-02 ENCOUNTER — Encounter: Payer: Self-pay | Admitting: Cardiology

## 2020-04-02 VITALS — BP 110/80 | HR 98 | Ht 69.0 in | Wt 231.0 lb

## 2020-04-02 DIAGNOSIS — I10 Essential (primary) hypertension: Secondary | ICD-10-CM

## 2020-04-02 DIAGNOSIS — E7800 Pure hypercholesterolemia, unspecified: Secondary | ICD-10-CM | POA: Diagnosis not present

## 2020-04-02 DIAGNOSIS — I509 Heart failure, unspecified: Secondary | ICD-10-CM | POA: Diagnosis not present

## 2020-04-02 NOTE — Progress Notes (Signed)
Cardiology Office Note:    Date:  04/02/2020   ID:  Alexander Drop., DOB 20-Mar-1966, MRN [id number]  PCP:  Rutherford Guys, MD  Cardiologist:  Kate Sable, MD  Electrophysiologist:  None   Referring MD: Rutherford Guys, MD   Chief Complaint  Patient presents with  . New Patient (Initial Visit)    Referred by PCP for CHF. Meds reviewed verbally with patient.    Alexander Millwee. is a 54 y.o. male who is being seen today for the evaluation of chf at the request of Pamella Pert, Irma M, MD.   History of Present Illness:    Alexander Griego. is a 54 y.o. male with a hx of diabetes, hyperlipidemia, hypertension who presents due to concern for CHF.  Patient states feeling out of breath starting 2 months ago.  He is a Administrator.  Last morning during the trip to [place], shortness of breath became so bad that he had to go to the emergency room.  He was seen in the ED where he was diagnosed with pulmonary edema and pneumonia.  He had an echocardiogram where his function was described as being reduced.  Patient was treated with antibiotics, started on Lasix which has greatly improved his symptoms.  He was discharged on Coreg and Aldactone with recommendations to follow-up with cardiology.  He denies any history of heart disease, heart attacks.  States having a family history of CHF in his father.  Currently denies edema, orthopnea, chest pain.  Takes all his medications as prescribed.  Denies smoking.  Past Medical History:  Diagnosis Date  . Diabetes mellitus without complication ([place])   . Hyperlipidemia   . Hypertension     Past Surgical History:  Procedure Laterality Date  . I & D EXTREMITY Left 11/24/2017   Procedure: IRRIGATION AND DEBRIDEMENT LEFT THIGH ABSCESS;  Surgeon: Erroll Luna, MD;  Location: [place];  Service: General;  Laterality: Left;  . ORIF HIP FRACTURE Right     Current Medications: Current Meds  Medication Sig  . albuterol (VENTOLIN HFA) 108 (90 Base) MCG/ACT  inhaler INHALE 2 PUFFS BY MOUTH FOUR TIMES DAILY FOR 7 DAYS THEN AS NEEDED  . aspirin 81 MG tablet Take 1 tablet (81 mg total) by mouth every other day.  Marland Kitchen BAYER LOW DOSE 81 MG chewable tablet Chew 81 mg by mouth daily.  . carvedilol (COREG) 6.25 MG tablet Take 1 tablet (6.25 mg total) by mouth 2 (two) times daily.  . clotrimazole (LOTRIMIN) 1 % cream APPLY TO AFFECTED AREA TWICE A DAY *NOT COVERED*  . furosemide (LASIX) 20 MG tablet Take 1 tablet (20 mg total) by mouth daily.  Marland Kitchen glipiZIDE (GLUCOTROL) 10 MG tablet TAKE 2 TABLETS BY MOUTH 2  TIMES DAILY BEFORE MEALS  . insulin glargine (LANTUS SOLOSTAR) 100 UNIT/ML Solostar Pen Inject 14 Units into the skin at bedtime.  . Insulin Pen Needle 32G X 4 MM MISC Use 1x a day  . losartan (COZAAR) 100 MG tablet TAKE 1 TABLET BY MOUTH  DAILY  . metFORMIN (GLUCOPHAGE) 1000 MG tablet TAKE 1 TABLET BY MOUTH  TWICE DAILY WITH MEALS  . Multiple Vitamin (MULTIVITAMIN) tablet Take 1 tablet by mouth daily.  . pioglitazone (ACTOS) 15 MG tablet TAKE 1 TABLET BY MOUTH  DAILY  . Semaglutide, 1 MG/DOSE, (OZEMPIC, 1 MG/DOSE,) 2 MG/1.5ML SOPN Inject 1 mg into the skin once a week.  . sildenafil (VIAGRA) 100 MG tablet TAKE 1 TABLET BY MOUTH AS  NEEDED  FOR ERECTILE  DYSFUNCTION AS DIRECTED  . simvastatin (ZOCOR) 40 MG tablet TAKE 1 TABLET BY MOUTH  DAILY  . spironolactone (ALDACTONE) 25 MG tablet Take 1 tablet (25 mg total) by mouth daily.     Allergies:   Patient has no known allergies.   Social History   Socioeconomic History  . Marital status: Married    Spouse name: Thayer Headings  . Number of children: 1  . Years of education: college  . Highest education level: Not on file  Occupational History  . Occupation: Self employed  Tobacco Use  . Smoking status: Never Smoker  . Smokeless tobacco: Never Used  Substance and Sexual Activity  . Alcohol use: Yes    Alcohol/week: 2.0 standard drinks    Types: 2 Standard drinks or equivalent per week    Comment: social     . Drug use: No  . Sexual activity: Yes    Partners: Female  Other Topics Concern  . Not on file  Social History Narrative   Lives with wife.     Social Determinants of Health   Financial Resource Strain:   . Difficulty of Paying Living Expenses:   Food Insecurity:   . Worried About Charity fundraiser in the Last Year:   . Arboriculturist in the Last Year:   Transportation Needs:   . Film/video editor (Medical):   Marland Kitchen Lack of Transportation (Non-Medical):   Physical Activity:   . Days of Exercise per Week:   . Minutes of Exercise per Session:   Stress:   . Feeling of Stress :   Social Connections:   . Frequency of Communication with Friends and Family:   . Frequency of Social Gatherings with Friends and Family:   . Attends Religious Services:   . Active Member of Clubs or Organizations:   . Attends Archivist Meetings:   Marland Kitchen Marital Status:      Family History: The patient's family history includes Diabetes in his father; Heart attack (age of onset: 6) in his father.  ROS:   Please see the history of present illness.     All other systems reviewed and are negative.  EKGs/Labs/Other Studies Reviewed:    The following studies were reviewed today:   EKG:  EKG is  ordered today.  The ekg ordered today demonstrates normal sinus rhythm, sinus arrhythmia possible left atrial enlargement.  Recent Labs: 05/30/2019: Hemoglobin 15.5; Platelets 241 03/19/2020: ALT 13; BUN 16; Creatinine, Ser 0.85; Potassium 4.8; Sodium 137; TSH 1.550  Recent Lipid Panel    Component Value Date/Time   CHOL 135 03/19/2020 0856   TRIG 126 03/19/2020 0856   HDL 66 03/19/2020 0856   CHOLHDL 2.0 03/19/2020 0856   CHOLHDL 2.5 10/21/2016 1223   VLDL 31 (H) 10/21/2016 1223   LDLCALC 47 03/19/2020 0856    Physical Exam:    VS:  BP 110/80 (BP Location: Right Arm, Patient Position: Sitting, Cuff Size: Normal)   Pulse 98   Ht 5\' 9"  (1.753 m)   Wt 231 lb (104.8 kg)   SpO2 98%    BMI 34.11 kg/m     Wt Readings from Last 3 Encounters:  04/02/20 231 lb (104.8 kg)  03/19/20 230 lb (104.3 kg)  02/06/20 234 lb (106.1 kg)     GEN:  Well nourished, well developed in no acute distress HEENT: Normal NECK: No JVD; No carotid bruits LYMPHATICS: No lymphadenopathy CARDIAC: RRR, no murmurs, rubs, gallops RESPIRATORY:  Clear  to auscultation without rales, wheezing or rhonchi  ABDOMEN: Soft, non-tender, non-distended MUSCULOSKELETAL:  No edema; No deformity  SKIN: Warm and dry NEUROLOGIC:  Alert and oriented x 3 PSYCHIATRIC:  Normal affect   ASSESSMENT:    1. Congestive heart failure, unspecified HF chronicity, unspecified heart failure type ([place])   2. Essential hypertension   3. Pure hypercholesterolemia    PLAN:    In order of problems listed above:  1. History of shortness of breath, recent admit to the hospital with reported reduced ejection fraction.  He currently describes NYHA class II symptoms.  Will get echocardiogram to evaluate systolic and diastolic function.  Continue current heart failure therapy of Coreg, losartan, spironolactone, Lasix.  If ejection fraction is reduced, will plan ischemic work-up via left heart cath. 2. History of hypertension, blood pressure control.  Continue current BP meds of Coreg losartan spironolactone. 3. History of hyperlipidemia, continue statin.  This note was generated in part or whole with voice recognition software. Voice recognition is usually quite accurate but there are transcription errors that can and very often do occur. I apologize for any typographical errors that were not detected and corrected.  Medication Adjustments/Labs and Tests Ordered: Current medicines are reviewed at length with the patient today.  Concerns regarding medicines are outlined above.  Orders Placed This Encounter  Procedures  . EKG 12-Lead  . ECHOCARDIOGRAM COMPLETE   No orders of the defined types were placed in this  encounter.   Patient Instructions  Medication Instructions:  Your physician recommends that you continue on your current medications as directed. Please refer to the Current Medication list given to you today. *If you need a refill on your cardiac medications before your next appointment, please call your pharmacy*   Lab Work: none If you have labs (blood work) drawn today and your tests are completely normal, you will receive your results only by: Marland Kitchen MyChart Message (if you have MyChart) OR . A paper copy in the mail If you have any lab test that is abnormal or we need to change your treatment, we will call you to review the results.   Testing/Procedures: ECHOCARDIOGRAM AS SOON AS POSSIBLE - Your physician has requested that you have an echocardiogram. Echocardiography is a painless test that uses sound waves to create images of your heart. It provides your doctor with information about the size and shape of your heart and how well your heart's chambers and valves are working. This procedure takes approximately one hour. There are no restrictions for this procedure. You may get an IV, if needed, to receive an ultrasound enhancing agent through to better visualize your heart.   Follow-Up: At [facility], you and your health needs are our priority.  As part of our continuing mission to provide you with exceptional heart care, we have created designated Provider Care Teams.  These Care Teams include your primary Cardiologist (physician) and Advanced Practice Providers (APPs -  Physician Assistants and Nurse Practitioners) who all work together to provide you with the care you need, when you need it.  We recommend signing up for the patient portal called "MyChart".  Sign up information is provided on this After Visit Summary.  MyChart is used to connect with patients for Virtual Visits (Telemedicine).  Patients are able to view lab/test results, encounter notes, upcoming appointments, etc.   Non-urgent messages can be sent to your provider as well.   To learn more about what you can do with MyChart, go to [email or web address].  Your next appointment:   After echo.  The format for your next appointment:   In Person  Provider:   Kate Sable, MD only.   Echocardiogram An echocardiogram is a procedure that uses painless sound waves (ultrasound) to produce an image of the heart. Images from an echocardiogram can provide important information about:  Signs of coronary artery disease (CAD).  Aneurysm detection. An aneurysm is a weak or damaged part of an artery wall that bulges out from the normal force of blood pumping through the body.  Heart size and shape. Changes in the size or shape of the heart can be associated with certain conditions, including heart failure, aneurysm, and CAD.  Heart muscle function.  Heart valve function.  Signs of a past heart attack.  Fluid buildup around the heart.  Thickening of the heart muscle.  A tumor or infectious growth around the heart valves. Tell a health care provider about:  Any allergies you have.  All medicines you are taking, including vitamins, herbs, eye drops, creams, and over-the-counter medicines.  Any blood disorders you have.  Any surgeries you have had.  Any medical conditions you have.  Whether you are pregnant or may be pregnant. What are the risks? Generally, this is a safe procedure. However, problems may occur, including:  Allergic reaction to dye (contrast) that may be used during the procedure. What happens before the procedure? No specific preparation is needed. You may eat and drink normally. What happens during the procedure?   An IV tube may be inserted into one of your veins.  You may receive contrast through this tube. A contrast is an injection that improves the quality of the pictures from your heart.  A gel will be applied to your chest.  A wand-like tool (transducer)  will be moved over your chest. The gel will help to transmit the sound waves from the transducer.  The sound waves will harmlessly bounce off of your heart to allow the heart images to be captured in real-time motion. The images will be recorded on a computer. The procedure may vary among health care providers and hospitals. What happens after the procedure?  You may return to your normal, everyday life, including diet, activities, and medicines, unless your health care provider tells you not to do that. Summary  An echocardiogram is a procedure that uses painless sound waves (ultrasound) to produce an image of the heart.  Images from an echocardiogram can provide important information about the size and shape of your heart, heart muscle function, heart valve function, and fluid buildup around your heart.  You do not need to do anything to prepare before this procedure. You may eat and drink normally.  After the echocardiogram is completed, you may return to your normal, everyday life, unless your health care provider tells you not to do that. This information is not intended to replace advice given to you by your health care provider. Make sure you discuss any questions you have with your health care provider. Document Revised: 03/31/2019 Document Reviewed: 01/10/2017 Elsevier Patient Education  2020 [place], Kate Sable, MD  04/02/2020 12:18 PM    [place]

## 2020-04-02 NOTE — Patient Instructions (Signed)
Medication Instructions:  Your physician recommends that you continue on your current medications as directed. Please refer to the Current Medication list given to you today. *If you need a refill on your cardiac medications before your next appointment, please call your pharmacy*   Lab Work: none If you have labs (blood work) drawn today and your tests are completely normal, you will receive your results only by: Marland Kitchen MyChart Message (if you have MyChart) OR . A paper copy in the mail If you have any lab test that is abnormal or we need to change your treatment, we will call you to review the results.   Testing/Procedures: ECHOCARDIOGRAM AS SOON AS POSSIBLE - Your physician has requested that you have an echocardiogram. Echocardiography is a painless test that uses sound waves to create images of your heart. It provides your doctor with information about the size and shape of your heart and how well your heart's chambers and valves are working. This procedure takes approximately one hour. There are no restrictions for this procedure. You may get an IV, if needed, to receive an ultrasound enhancing agent through to better visualize your heart.   Follow-Up: At [facility], you and your health needs are our priority.  As part of our continuing mission to provide you with exceptional heart care, we have created designated Provider Care Teams.  These Care Teams include your primary Cardiologist (physician) and Advanced Practice Providers (APPs -  Physician Assistants and Nurse Practitioners) who all work together to provide you with the care you need, when you need it.  We recommend signing up for the patient portal called "MyChart".  Sign up information is provided on this After Visit Summary.  MyChart is used to connect with patients for Virtual Visits (Telemedicine).  Patients are able to view lab/test results, encounter notes, upcoming appointments, etc.  Non-urgent messages can be sent to your  provider as well.   To learn more about what you can do with MyChart, go to [email or web address].    Your next appointment:   After echo.  The format for your next appointment:   In Person  Provider:   Kate Sable, MD only.   Echocardiogram An echocardiogram is a procedure that uses painless sound waves (ultrasound) to produce an image of the heart. Images from an echocardiogram can provide important information about:  Signs of coronary artery disease (CAD).  Aneurysm detection. An aneurysm is a weak or damaged part of an artery wall that bulges out from the normal force of blood pumping through the body.  Heart size and shape. Changes in the size or shape of the heart can be associated with certain conditions, including heart failure, aneurysm, and CAD.  Heart muscle function.  Heart valve function.  Signs of a past heart attack.  Fluid buildup around the heart.  Thickening of the heart muscle.  A tumor or infectious growth around the heart valves. Tell a health care provider about:  Any allergies you have.  All medicines you are taking, including vitamins, herbs, eye drops, creams, and over-the-counter medicines.  Any blood disorders you have.  Any surgeries you have had.  Any medical conditions you have.  Whether you are pregnant or may be pregnant. What are the risks? Generally, this is a safe procedure. However, problems may occur, including:  Allergic reaction to dye (contrast) that may be used during the procedure. What happens before the procedure? No specific preparation is needed. You may eat and drink normally. What happens during  the procedure?   An IV tube may be inserted into one of your veins.  You may receive contrast through this tube. A contrast is an injection that improves the quality of the pictures from your heart.  A gel will be applied to your chest.  A wand-like tool (transducer) will be moved over your chest. The gel  will help to transmit the sound waves from the transducer.  The sound waves will harmlessly bounce off of your heart to allow the heart images to be captured in real-time motion. The images will be recorded on a computer. The procedure may vary among health care providers and hospitals. What happens after the procedure?  You may return to your normal, everyday life, including diet, activities, and medicines, unless your health care provider tells you not to do that. Summary  An echocardiogram is a procedure that uses painless sound waves (ultrasound) to produce an image of the heart.  Images from an echocardiogram can provide important information about the size and shape of your heart, heart muscle function, heart valve function, and fluid buildup around your heart.  You do not need to do anything to prepare before this procedure. You may eat and drink normally.  After the echocardiogram is completed, you may return to your normal, everyday life, unless your health care provider tells you not to do that. This information is not intended to replace advice given to you by your health care provider. Make sure you discuss any questions you have with your health care provider. Document Revised: 03/31/2019 Document Reviewed: 01/10/2017 Elsevier Patient Education  [place].

## 2020-04-03 ENCOUNTER — Other Ambulatory Visit: Payer: Self-pay | Admitting: Cardiology

## 2020-04-03 ENCOUNTER — Ambulatory Visit (INDEPENDENT_AMBULATORY_CARE_PROVIDER_SITE_OTHER): Payer: 59

## 2020-04-03 DIAGNOSIS — I509 Heart failure, unspecified: Secondary | ICD-10-CM | POA: Diagnosis not present

## 2020-04-03 DIAGNOSIS — R0600 Dyspnea, unspecified: Secondary | ICD-10-CM

## 2020-04-03 MED ORDER — PERFLUTREN LIPID MICROSPHERE
1.0000 mL | INTRAVENOUS | Status: AC | PRN
  Administered 2020-04-03: 2 mL via INTRAVENOUS

## 2020-04-05 ENCOUNTER — Telehealth: Payer: Self-pay | Admitting: *Deleted

## 2020-04-05 NOTE — Telephone Encounter (Signed)
-----   Message from Kate Sable, MD sent at 04/04/2020  4:15 PM EDT ----- Patient has moderate to severely reduced ejection fraction.  EF 30 to 35%.  Keep follow-up appointment.  If patient can follow-up with me on an earlier date if my schedule permits that will also be good.  We need to schedule patient for right and left heart cath to evaluate presence of CAD.  Thank you

## 2020-04-05 NOTE — Telephone Encounter (Signed)
Results called to pt. Pt verbalized understanding. He is agreeable to coming in tomorrow for appointment.  He is aware of date and time.

## 2020-04-05 NOTE — Telephone Encounter (Signed)
No answer. Left message to call back.   Hold placed on appointment for tomorrow if patient able to come.

## 2020-04-06 ENCOUNTER — Ambulatory Visit: Payer: 59 | Admitting: Cardiology

## 2020-04-07 ENCOUNTER — Other Ambulatory Visit: Payer: Self-pay | Admitting: Family Medicine

## 2020-04-07 DIAGNOSIS — E119 Type 2 diabetes mellitus without complications: Secondary | ICD-10-CM

## 2020-04-07 DIAGNOSIS — E785 Hyperlipidemia, unspecified: Secondary | ICD-10-CM

## 2020-04-07 NOTE — Telephone Encounter (Signed)
Requested Prescriptions  Pending Prescriptions Disp Refills  . metFORMIN (GLUCOPHAGE) 1000 MG tablet [Pharmacy Med Name: METFORMIN HCL 1,000 MG TABLET] 180 tablet 0    Sig: TAKE 1 TABLET BY MOUTH TWICE A DAY WITH MEALS     Endocrinology:  Diabetes - Biguanides Failed - 04/07/2020 10:41 AM      Failed - HBA1C is between 0 and 7.9 and within 180 days    Hemoglobin A1C  Date Value Ref Range Status  02/06/2020 10.8 (A) 4.0 - 5.6 % Final   Hgb A1c MFr Bld  Date Value Ref Range Status  05/30/2019 12.1 (H) 4.8 - 5.6 % Final    Comment:             Prediabetes: 5.7 - 6.4          Diabetes: >6.4          Glycemic control for adults with diabetes: <7.0          Passed - Cr in normal range and within 360 days    Creat  Date Value Ref Range Status  10/21/2016 0.79 0.70 - 1.33 mg/dL Final    Comment:      For patients > or = 55 years of age: The upper reference limit for Creatinine is approximately 13% higher for people identified as African-American.      Creatinine, Ser  Date Value Ref Range Status  03/19/2020 0.85 0.76 - 1.27 mg/dL Final   Creatinine, Urine  Date Value Ref Range Status  07/22/2015 46.8 mg/dL Final    Comment:    No reference range established.         Passed - eGFR in normal range and within 360 days    GFR, Est African American  Date Value Ref Range Status  10/21/2016 >89 >=60 mL/min Final   GFR calc Af Amer  Date Value Ref Range Status  03/19/2020 113 >59 mL/min/1.73 Final   GFR, Est Non African American  Date Value Ref Range Status  10/21/2016 >89 >=60 mL/min Final   GFR calc non Af Amer  Date Value Ref Range Status  03/19/2020 98 >59 mL/min/1.73 Final         Passed - Valid encounter within last 6 months    Recent Outpatient Visits          2 weeks ago Type 2 diabetes mellitus with hyperglycemia, without long-term current use of insulin ([place])   [facility], Lilia Argue, MD   7 months ago Type 2 diabetes mellitus without  complication, without long-term current use of insulin [facility])   [facility], Lilia Argue, MD   10 months ago Annual physical exam   [facility], Lilia Argue, MD   1 year ago Annual physical exam   Primary Care at [facility], Gelene Mink, PA-C   2 years ago Hospital discharge follow-up   Primary Care at [facility], Gelene Mink, PA-C      Future Appointments            In 2 days Loel Dubonnet, NP [facility], [place]   In 2 months Pamella Pert, Lilia Argue, MD Primary Care at [place], [facility]

## 2020-04-08 ENCOUNTER — Encounter: Payer: Self-pay | Admitting: Family

## 2020-04-08 NOTE — Progress Notes (Signed)
Office Visit    Patient Name: Alexander [name]. Date of Encounter: 04/09/2020  Primary Care Provider:  Rutherford Guys, MD Primary Cardiologist:  Kate Sable, MD Electrophysiologist:  None   Chief Complaint    Alexander Merritt. is a 54 y.o. male with a hx of HFrEF, DM2, HLD, HTN presents today for follow up after echocardiogram.   Past Medical History    Past Medical History:  Diagnosis Date  . Diabetes mellitus without complication ([place])   . HFrEF (heart failure with reduced ejection fraction) ([place]) 03/2020   Echo 03/2020 LVEF 30-35%  . Hyperlipidemia   . Hypertension    Past Surgical History:  Procedure Laterality Date  . I & D EXTREMITY Left 11/24/2017   Procedure: IRRIGATION AND DEBRIDEMENT LEFT THIGH ABSCESS;  Surgeon: Erroll Luna, MD;  Location: [place];  Service: General;  Laterality: Left;  . ORIF HIP FRACTURE Right    Allergies  No Known Allergies  History of Present Illness    Alexander Cutrone. is a 54 y.o. male with a hx of HFrEF, DM2, HLD, HTN last seen 04/02/20 by Dr. Garen Lah.  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 His family history is notable for CHF in his father.   He was referred for concern for CHF. Reports feeling out of breath for 2 months. Seen in ED at [facility] in [place], [place] and diagnosed with pulmonary edema and pneumonia. Echocardiogram showed reduced LVEF. He was treated with Lasix and discharged on Coreg and Aldactone.   Dr. Garen Lah recommended for echocardiogram. Ecoh 04/03/20 with LVEF 30-35%, mild LVH, gr2DD, RV normal size and function, LA mildly dilated, small-mod pericardial effusion, trivial MR, mild AI. Recommended for right and left heart cath to evaluate for possible CAD contributory to is cardiomyopathy.   Discussed Actos and Metformin.  Discussed relationship between Actos and heart failure.  Encouraged to discontinue.  Tells me he has been on this medication for a number of years.  He questions whether the  Metformin is still working for him.  He is interested in getting off of this medication even if he has to increase his Lantus dosing.  Encouraged to discuss with his endocrinologist at upcoming appointment 05/07/2020  Reports no chest pain, pressure, tightness.  Reports no shortness of breath at rest.  Reports dyspnea on exertion.  Reports no lower extremity edema.  Does note some abdominal swelling.  Reports compliance with all of his medications.  Walks 2 miles per day for exercise.  Tries to avoid salt and does not add salt to food.  Does eat out for a number of meals out due to traveling.  Reviewed Dr. Thereasa Solo recommendation for right and left cardiac catheterization in the setting of reduced EF 30-35%.  Discussed possible modalities of heart failure.  Heart failure education completed regarding low-sodium diet, lifestyle changes.  Long discussion regarding medical therapies for heart failure.  Initiated discussion regarding Delene Loll but he prefers to wait until after his cardiac catheterization.  EKGs/Labs/Other Studies Reviewed:   The following studies were reviewed today:  Echo 04/03/20  1. Left ventricular ejection fraction, by estimation, is 30 to 35%. The  left ventricle has moderately decreased function. The left ventricle  demonstrates global hypokinesis. There is mild left ventricular  hypertrophy. Left ventricular diastolic  parameters are consistent with Grade II diastolic dysfunction  (pseudonormalization). Elevated left atrial pressure.   2. Right ventricular systolic function is normal. The right ventricular  size is normal.  Tricuspid regurgitation signal is inadequate for assessing  PA pressure.   3. Left atrial size was mildly dilated.   4. Small to moderate pericardial effusion is present.   5. The mitral valve is abnormal. Trivial mitral valve regurgitation.   6. The aortic valve is tricuspid. Aortic valve regurgitation is mild.   7. Mildly dilated pulmonary  artery.   EKG:  EKG is ordered today.  The ekg ordered today demonstrates sinus rhythm 92 BPM with sinus arrhythmia, possible left atrial enlargement, nonspecific T wave abnormality with T wave inversion in lead V6  Recent Labs: 05/30/2019: Hemoglobin 15.5; Platelets 241 03/19/2020: ALT 13; BUN 16; Creatinine, Ser 0.85; Potassium 4.8; Sodium 137; TSH 1.550  Recent Lipid Panel    Component Value Date/Time   CHOL 135 03/19/2020 0856   TRIG 126 03/19/2020 0856   HDL 66 03/19/2020 0856   CHOLHDL 2.0 03/19/2020 0856   CHOLHDL 2.5 10/21/2016 1223   VLDL 31 (H) 10/21/2016 1223   LDLCALC 47 03/19/2020 0856    Home Medications   Current Meds  Medication Sig  . albuterol (VENTOLIN HFA) 108 (90 Base) MCG/ACT inhaler INHALE 2 PUFFS BY MOUTH FOUR TIMES DAILY FOR 7 DAYS THEN AS NEEDED  . aspirin 81 MG tablet Take 1 tablet (81 mg total) by mouth every other day.  Marland Kitchen BAYER LOW DOSE 81 MG chewable tablet Chew 81 mg by mouth daily.  . carvedilol (COREG) 6.25 MG tablet Take 1 tablet (6.25 mg total) by mouth 2 (two) times daily.  . clotrimazole (LOTRIMIN) 1 % cream APPLY TO AFFECTED AREA TWICE A DAY *NOT COVERED*  . furosemide (LASIX) 20 MG tablet Take 1 tablet (20 mg total) by mouth daily.  Marland Kitchen glipiZIDE (GLUCOTROL) 10 MG tablet TAKE 2 TABLETS BY MOUTH 2  TIMES DAILY BEFORE MEALS  . insulin glargine (LANTUS SOLOSTAR) 100 UNIT/ML Solostar Pen Inject 14 Units into the skin at bedtime.  . Insulin Pen Needle 32G X 4 MM MISC Use 1x a day  . losartan (COZAAR) 100 MG tablet TAKE 1 TABLET BY MOUTH  DAILY  . metFORMIN (GLUCOPHAGE) 1000 MG tablet TAKE 1 TABLET BY MOUTH TWICE A DAY WITH MEALS  . Multiple Vitamin (MULTIVITAMIN) tablet Take 1 tablet by mouth daily.  . Semaglutide, 1 MG/DOSE, (OZEMPIC, 1 MG/DOSE,) 2 MG/1.5ML SOPN Inject 1 mg into the skin once a week.  . simvastatin (ZOCOR) 40 MG tablet TAKE 1 TABLET BY MOUTH  DAILY  . spironolactone (ALDACTONE) 25 MG tablet Take 1 tablet (25 mg total) by mouth daily.   . [DISCONTINUED] pioglitazone (ACTOS) 15 MG tablet TAKE 1 TABLET BY MOUTH  DAILY      Review of Systems    Review of Systems  Constitution: Negative for chills, fever and malaise/fatigue.  Cardiovascular: Positive for dyspnea on exertion. Negative for chest pain, leg swelling, near-syncope, orthopnea, palpitations and syncope.  Respiratory: Negative for cough, shortness of breath and wheezing.   Gastrointestinal: Positive for bloating. Negative for nausea and vomiting.  Neurological: Negative for dizziness, light-headedness and weakness.   All other systems reviewed and are otherwise negative except as noted above.  Physical Exam    VS:  BP 128/90 (BP Location: Left Arm, Patient Position: Sitting, Cuff Size: Normal)   Pulse 92   Ht 5\' 9"  (1.753 m)   Wt 233 lb 4 oz (105.8 kg)   SpO2 98%   BMI 34.45 kg/m  , BMI Body mass index is 34.45 kg/m. GEN: Well nourished, overweight, well developed, in no  acute distress. HEENT: normal. Neck: Supple, no JVD, carotid bruits, or masses. Cardiac: RRR, no murmurs, rubs, or gallops. No clubbing, cyanosis, edema.  Radials/DP/PT 2+ and equal bilaterally.  Respiratory:  Respirations regular and unlabored, clear to auscultation bilaterally. GI: Soft, nontender, nondistended, BS + x 4. MS: No deformity or atrophy. Skin: Warm and dry, no rash. Neuro:  Strength and sensation are intact. Psych: Normal affect.  Accessory Clinical Findings    ECG personally reviewed by me today - SR 92 bpm with sinus arrhythmia and possible LA enlargement. Noted TWI in lead V6  - no acute changes.  Assessment & Plan    1. HFrEF - Echo 04/03/2020 with LVEF [id number], grade 2 diastolic dysfunction.  Euvolemic and well compensated on exam.  NYHA II with DOE on exertion. GDMT includes ARB, beta blocker, loop diuretic, MRA.   Low-sodium, heart healthy diet encouraged.  Regular cardiovascular exercise recommended.  He will report weight gain of 3 pounds overnight or 5 pounds  in 1 week.  CBC, BMP today  Right and left cardiac catheterization ordered today for evaluation of possible CAD in setting of newly reduced EF. The patient understands that risks include but are not limited to stroke (1 in 1000), death (1 in 4), kidney failure [usually temporary] (1 in 500), bleeding (1 in 200), allergic reaction [possibly serious] (1 in 200), and agrees to proceed.   Initiated discussion regarding Entresto. Provided with education pamphlet. He prefers to discuss with Dr. Garen Lah after catheterization.   2. HTN - BP well controlled. Continue present antihypertensive regimen.   3. HLD - Lipid panel 03/19/20 with total cholesterol 135, HDL 66, triglycerides 126, LDL 47. Continue Simvastatin 40mg  daily.   4. DM2 - 02/06/20 A1c 10.8. Follows with Dr. Cruzita Lederer of endocrinology as well as primary care. Would strongly suggest discontinuation of Actos as it can be contributory to heart failure. I have asked him to stop Actos and will make Dr. Renne Crigler and Dr. Pamella Pert aware. He is interested in simplifying his DM2 regimen and asks if he could stop Metformin and use more Lantus - encouraged him to discuss at upcoming appt with Dr. Cruzita Lederer. May benefit from Dapagliflozin for heart failure benefit.   Disposition: BMP, CBC today. Cardiac catheterization 04/30/20. Follow up one week after cardiac cath with Dr. Garen Lah.  Alexander Dubonnet, NP 04/09/2020, 9:45 AM

## 2020-04-08 NOTE — H&P (View-Only) (Signed)
Office Visit    Patient Name: Alexander [name]. Date of Encounter: 04/09/2020  Primary Care Provider:  Rutherford Guys, MD Primary Cardiologist:  Kate Sable, MD Electrophysiologist:  None   Chief Complaint    Alexander Kempker. is a 54 y.o. male with a hx of HFrEF, DM2, HLD, HTN presents today for follow up after echocardiogram.   Past Medical History    Past Medical History:  Diagnosis Date  . Diabetes mellitus without complication ([place])   . HFrEF (heart failure with reduced ejection fraction) ([place]) 03/2020   Echo 03/2020 LVEF 30-35%  . Hyperlipidemia   . Hypertension    Past Surgical History:  Procedure Laterality Date  . I & D EXTREMITY Left 11/24/2017   Procedure: IRRIGATION AND DEBRIDEMENT LEFT THIGH ABSCESS;  Surgeon: Erroll Luna, MD;  Location: [facility];  Service: General;  Laterality: Left;  . ORIF HIP FRACTURE Right    Allergies  No Known Allergies  History of Present Illness    Alexander Rosener. is a 54 y.o. male with a hx of HFrEF, DM2, HLD, HTN last seen 04/02/20 by Dr. Garen [name].  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 His family history is notable for CHF in his father.   He was referred for concern for CHF. Reports feeling out of breath for 2 months. Seen in ED at [facility] in [place], [place] and diagnosed with pulmonary edema and pneumonia. Echocardiogram showed reduced LVEF. He was treated with Lasix and discharged on Coreg and Aldactone.   Dr. Garen [name] recommended for echocardiogram. Ecoh 04/03/20 with LVEF 30-35%, mild LVH, gr2DD, RV normal size and function, LA mildly dilated, small-mod pericardial effusion, trivial MR, mild AI. Recommended for right and left heart cath to evaluate for possible CAD contributory to is cardiomyopathy.   Discussed Actos and Metformin.  Discussed relationship between Actos and heart failure.  Encouraged to discontinue.  Tells me he has been on this medication for a number of years.  He questions whether the  Metformin is still working for him.  He is interested in getting off of this medication even if he has to increase his Lantus dosing.  Encouraged to discuss with his endocrinologist at upcoming appointment 05/07/2020  Reports no chest pain, pressure, tightness.  Reports no shortness of breath at rest.  Reports dyspnea on exertion.  Reports no lower extremity edema.  Does note some abdominal swelling.  Reports compliance with all of his medications.  Walks 2 miles per day for exercise.  Tries to avoid salt and does not add salt to food.  Does eat out for a number of meals out due to traveling.  Reviewed Dr. Thereasa Solo recommendation for right and left cardiac catheterization in the setting of reduced EF 30-35%.  Discussed possible modalities of heart failure.  Heart failure education completed regarding low-sodium diet, lifestyle changes.  Long discussion regarding medical therapies for heart failure.  Initiated discussion regarding Delene Loll but he prefers to wait until after his cardiac catheterization.  EKGs/Labs/Other Studies Reviewed:   The following studies were reviewed today:  Echo 04/03/20  1. Left ventricular ejection fraction, by estimation, is 30 to 35%. The  left ventricle has moderately decreased function. The left ventricle  demonstrates global hypokinesis. There is mild left ventricular  hypertrophy. Left ventricular diastolic  parameters are consistent with Grade II diastolic dysfunction  (pseudonormalization). Elevated left atrial pressure.   2. Right ventricular systolic function is normal. The right ventricular  size is normal.  Tricuspid regurgitation signal is inadequate for assessing  PA pressure.   3. Left atrial size was mildly dilated.   4. Small to moderate pericardial effusion is present.   5. The mitral valve is abnormal. Trivial mitral valve regurgitation.   6. The aortic valve is tricuspid. Aortic valve regurgitation is mild.   7. Mildly dilated pulmonary  artery.   EKG:  EKG is ordered today.  The ekg ordered today demonstrates sinus rhythm 92 BPM with sinus arrhythmia, possible left atrial enlargement, nonspecific T wave abnormality with T wave inversion in lead V6  Recent Labs: 05/30/2019: Hemoglobin 15.5; Platelets 241 03/19/2020: ALT 13; BUN 16; Creatinine, Ser 0.85; Potassium 4.8; Sodium 137; TSH 1.550  Recent Lipid Panel    Component Value Date/Time   CHOL 135 03/19/2020 0856   TRIG 126 03/19/2020 0856   HDL 66 03/19/2020 0856   CHOLHDL 2.0 03/19/2020 0856   CHOLHDL 2.5 10/21/2016 1223   VLDL 31 (H) 10/21/2016 1223   LDLCALC 47 03/19/2020 0856    Home Medications   Current Meds  Medication Sig  . albuterol (VENTOLIN HFA) 108 (90 Base) MCG/ACT inhaler INHALE 2 PUFFS BY MOUTH FOUR TIMES DAILY FOR 7 DAYS THEN AS NEEDED  . aspirin 81 MG tablet Take 1 tablet (81 mg total) by mouth every other day.  Marland Kitchen BAYER LOW DOSE 81 MG chewable tablet Chew 81 mg by mouth daily.  . carvedilol (COREG) 6.25 MG tablet Take 1 tablet (6.25 mg total) by mouth 2 (two) times daily.  . clotrimazole (LOTRIMIN) 1 % cream APPLY TO AFFECTED AREA TWICE A DAY *NOT COVERED*  . furosemide (LASIX) 20 MG tablet Take 1 tablet (20 mg total) by mouth daily.  Marland Kitchen glipiZIDE (GLUCOTROL) 10 MG tablet TAKE 2 TABLETS BY MOUTH 2  TIMES DAILY BEFORE MEALS  . insulin glargine (LANTUS SOLOSTAR) 100 UNIT/ML Solostar Pen Inject 14 Units into the skin at bedtime.  . Insulin Pen Needle 32G X 4 MM MISC Use 1x a day  . losartan (COZAAR) 100 MG tablet TAKE 1 TABLET BY MOUTH  DAILY  . metFORMIN (GLUCOPHAGE) 1000 MG tablet TAKE 1 TABLET BY MOUTH TWICE A DAY WITH MEALS  . Multiple Vitamin (MULTIVITAMIN) tablet Take 1 tablet by mouth daily.  . Semaglutide, 1 MG/DOSE, (OZEMPIC, 1 MG/DOSE,) 2 MG/1.5ML SOPN Inject 1 mg into the skin once a week.  . simvastatin (ZOCOR) 40 MG tablet TAKE 1 TABLET BY MOUTH  DAILY  . spironolactone (ALDACTONE) 25 MG tablet Take 1 tablet (25 mg total) by mouth daily.   . [DISCONTINUED] pioglitazone (ACTOS) 15 MG tablet TAKE 1 TABLET BY MOUTH  DAILY      Review of Systems    Review of Systems  Constitution: Negative for chills, fever and malaise/fatigue.  Cardiovascular: Positive for dyspnea on exertion. Negative for chest pain, leg swelling, near-syncope, orthopnea, palpitations and syncope.  Respiratory: Negative for cough, shortness of breath and wheezing.   Gastrointestinal: Positive for bloating. Negative for nausea and vomiting.  Neurological: Negative for dizziness, light-headedness and weakness.   All other systems reviewed and are otherwise negative except as noted above.  Physical Exam    VS:  BP 128/90 (BP Location: Left Arm, Patient Position: Sitting, Cuff Size: Normal)   Pulse 92   Ht 5\' 9"  (1.753 m)   Wt 233 lb 4 oz (105.8 kg)   SpO2 98%   BMI 34.45 kg/m  , BMI Body mass index is 34.45 kg/m. GEN: Well nourished, overweight, well developed, in no  acute distress. HEENT: normal. Neck: Supple, no JVD, carotid bruits, or masses. Cardiac: RRR, no murmurs, rubs, or gallops. No clubbing, cyanosis, edema.  Radials/DP/PT 2+ and equal bilaterally.  Respiratory:  Respirations regular and unlabored, clear to auscultation bilaterally. GI: Soft, nontender, nondistended, BS + x 4. MS: No deformity or atrophy. Skin: Warm and dry, no rash. Neuro:  Strength and sensation are intact. Psych: Normal affect.  Accessory Clinical Findings    ECG personally reviewed by me today - SR 92 bpm with sinus arrhythmia and possible LA enlargement. Noted TWI in lead V6  - no acute changes.  Assessment & Plan    1. HFrEF - Echo 04/03/2020 with LVEF 99991111, grade 2 diastolic dysfunction.  Euvolemic and well compensated on exam.  NYHA II with DOE on exertion. GDMT includes ARB, beta blocker, loop diuretic, MRA.   Low-sodium, heart healthy diet encouraged.  Regular cardiovascular exercise recommended.  He will report weight gain of 3 pounds overnight or 5 pounds  in 1 week.  CBC, BMP today  Right and left cardiac catheterization ordered today for evaluation of possible CAD in setting of newly reduced EF. The patient understands that risks include but are not limited to stroke (1 in 1000), death (1 in 50), kidney failure [usually temporary] (1 in 500), bleeding (1 in 200), allergic reaction [possibly serious] (1 in 200), and agrees to proceed.   Initiated discussion regarding Entresto. Provided with education pamphlet. He prefers to discuss with Dr. Garen [name] after catheterization.   2. HTN - BP well controlled. Continue present antihypertensive regimen.   3. HLD - Lipid panel 03/19/20 with total cholesterol 135, HDL 66, triglycerides 126, LDL 47. Continue Simvastatin 40mg  daily.   4. DM2 - 02/06/20 A1c 10.8. Follows with Dr. Cruzita Lederer of endocrinology as well as primary care. Would strongly suggest discontinuation of Actos as it can be contributory to heart failure. I have asked him to stop Actos and will make Dr. Renne [name] and Dr. Pamella [name] aware. He is interested in simplifying his DM2 regimen and asks if he could stop Metformin and use more Lantus - encouraged him to discuss at upcoming appt with Dr. Cruzita Lederer. May benefit from Dapagliflozin for heart failure benefit.   Disposition: BMP, CBC today. Cardiac catheterization 04/30/20. Follow up one week after cardiac cath with Dr. Garen [name].  Loel Dubonnet, NP 04/09/2020, 9:45 AM

## 2020-04-09 ENCOUNTER — Ambulatory Visit (INDEPENDENT_AMBULATORY_CARE_PROVIDER_SITE_OTHER): Payer: 59 | Admitting: Family

## 2020-04-09 ENCOUNTER — Telehealth: Payer: Self-pay | Admitting: Cardiology

## 2020-04-09 ENCOUNTER — Other Ambulatory Visit: Payer: Self-pay

## 2020-04-09 ENCOUNTER — Encounter: Payer: Self-pay | Admitting: Family

## 2020-04-09 VITALS — BP 128/90 | HR 92 | Ht 69.0 in | Wt 233.2 lb

## 2020-04-09 DIAGNOSIS — Z794 Long term (current) use of insulin: Secondary | ICD-10-CM

## 2020-04-09 DIAGNOSIS — E7800 Pure hypercholesterolemia, unspecified: Secondary | ICD-10-CM

## 2020-04-09 DIAGNOSIS — E1165 Type 2 diabetes mellitus with hyperglycemia: Secondary | ICD-10-CM | POA: Diagnosis not present

## 2020-04-09 DIAGNOSIS — I10 Essential (primary) hypertension: Secondary | ICD-10-CM | POA: Diagnosis not present

## 2020-04-09 DIAGNOSIS — I5042 Chronic combined systolic (congestive) and diastolic (congestive) heart failure: Secondary | ICD-10-CM

## 2020-04-09 NOTE — Telephone Encounter (Signed)
Patient wants to change cath date to 5/10.  Please call to change .

## 2020-04-09 NOTE — Patient Instructions (Addendum)
Medication Instructions:  1- STOP Actos *If you need a refill on your cardiac medications before your next appointment, please call your pharmacy*   Lab Work: 1- Your physician recommends that you have lab work today(CBC, BMET) 2- CV19 Pre admit testing DRIVE THRU  Please report to the PAT testing site ([facility]) on ______5/6 or 5/7___ date ______8 am-12 noon_____ time for your DRIVE THRU covid testing that is required prior to your procedure.  Following covid testing, please remain in quarantine. If you must be around others, please wash hands, avoid touching face and wear your mask.    If you have labs (blood work) drawn today and your tests are completely normal, you will receive your results only by: Marland Kitchen MyChart Message (if you have MyChart) OR . A paper copy in the mail If you have any lab test that is abnormal or we need to change your treatment, we will call you to review the results.   Testing/Procedures:    [address], [place] [address] Dept: [phone] Loc: [facility].  04/09/2020  You are scheduled for a Cardiac Catheterization on Monday, May 10rd with Dr. Kathlyn Sacramento.  1. Please arrive at the medical mall of [facility] at _____ (This time is one  hour before your procedure to ensure your preparation). Free valet parking service is available.   Special note: Every effort is made to have your procedure done on time. Please understand that emergencies sometimes delay scheduled procedures.  2. Diet: Do not eat solid foods after midnight.  The patient may have clear liquids until 5am upon the day of the procedure.  3. Labs: Done today   4. Medication instructions in preparation for your procedure: Hold metfomin 24 hr prior to procedure. Hold Lasix the morning of procedure.  Take 1/2 dose of Lantus the evening before  procedure.  On the morning of your procedure, take your Aspirin and any morning medicines NOT listed above.  You may use sips of water.  5. Plan for one night stay--bring personal belongings. 6. Bring a current list of your medications and current insurance cards. 7. You MUST have a responsible person to drive you home. 8. Someone MUST be with you the first 24 hours after you arrive home or your discharge will be delayed. 9. Please wear clothes that are easy to get on and off and wear slip-on shoes.  Thank you for allowing [place] to care for you!   -- [facility]   Follow-Up: At [facility], you and your health needs are our priority.  As part of our continuing mission to provide you with exceptional heart care, we have created designated Provider Care Teams.  These Care Teams include your primary Cardiologist (physician) and Advanced Practice Providers (APPs -  Physician Assistants and Nurse Practitioners) who all work together to provide you with the care you need, when you need it.  We recommend signing up for the patient portal called "MyChart".  Sign up information is provided on this After Visit Summary.  MyChart is used to connect with patients for Virtual Visits (Telemedicine).  Patients are able to view lab/test results, encounter notes, upcoming appointments, etc.  Non-urgent messages can be sent to your provider as well.   To learn more about what you can do with MyChart, go to [email or web address].    Your next appointment:  4 weeks with MD  or Laurann Montana, NP

## 2020-04-09 NOTE — Telephone Encounter (Signed)
Call to patient to confirm cath 5/10 @ 0830 arrival time. Pt verbalized understanding and will get covid test 04/27/20 with PAT.   No further questions at this time.

## 2020-04-10 ENCOUNTER — Encounter: Payer: Self-pay | Admitting: Internal Medicine

## 2020-04-10 LAB — CBC
Hematocrit: 41.8 % (ref 37.5–51.0)
Hemoglobin: 13.9 g/dL (ref 13.0–17.7)
MCH: 29.5 pg (ref 26.6–33.0)
MCHC: 33.3 g/dL (ref 31.5–35.7)
MCV: 89 fL (ref 79–97)
Platelets: 246 10*3/uL (ref 150–450)
RBC: 4.71 x10E6/uL (ref 4.14–5.80)
RDW: 13.7 % (ref 11.6–15.4)
WBC: 6.1 10*3/uL (ref 3.4–10.8)

## 2020-04-10 LAB — BASIC METABOLIC PANEL
BUN/Creatinine Ratio: 23 — ABNORMAL HIGH (ref 9–20)
BUN: 20 mg/dL (ref 6–24)
CO2: 20 mmol/L (ref 20–29)
Calcium: 9.3 mg/dL (ref 8.7–10.2)
Chloride: 103 mmol/L (ref 96–106)
Creatinine, Ser: 0.86 mg/dL (ref 0.76–1.27)
GFR calc Af Amer: 113 mL/min/{1.73_m2} (ref >59)
GFR calc non Af Amer: 98 mL/min/{1.73_m2} (ref >59)
Glucose: 171 mg/dL — ABNORMAL HIGH (ref 65–99)
Potassium: 4.1 mmol/L (ref 3.5–5.2)
Sodium: 139 mmol/L (ref 134–144)

## 2020-04-16 ENCOUNTER — Ambulatory Visit: Payer: 59 | Admitting: Cardiology

## 2020-04-23 ENCOUNTER — Other Ambulatory Visit: Payer: Self-pay | Admitting: Family Medicine

## 2020-04-23 DIAGNOSIS — I10 Essential (primary) hypertension: Secondary | ICD-10-CM

## 2020-04-23 DIAGNOSIS — I5021 Acute systolic (congestive) heart failure: Secondary | ICD-10-CM

## 2020-04-23 MED ORDER — CARVEDILOL 6.25 MG PO TABS: 6.2500 mg | ORAL_TABLET | Freq: Two times a day (BID) | ORAL | 0 refills | Status: DC

## 2020-04-23 MED ORDER — SPIRONOLACTONE 25 MG PO TABS: 25.0000 mg | ORAL_TABLET | Freq: Every day | ORAL | 0 refills | Status: DC

## 2020-04-23 MED ORDER — FUROSEMIDE 20 MG PO TABS: 20.0000 mg | ORAL_TABLET | Freq: Every day | ORAL | 0 refills | Status: DC

## 2020-04-23 NOTE — Telephone Encounter (Signed)
Copied from [place] [id number]. Topic: Quick Communication - Rx Refill/Question >> Apr 23, 2020  3:44 PM Mcneil, Ja-Kwan wrote: Medication: furosemide (LASIX) 20 MG tablet, spironolactone (ALDACTONE) 25 MG tablet, and carvedilol (COREG) 6.25 MG tablet  Has the patient contacted their pharmacy? no  Preferred Pharmacy (with phone number or street name): [facility] [name], [place] Phone: [phone]   Fax: [phone]  Agent: Please be advised that RX refills may take up to 3 business days. We ask that you follow-up with your pharmacy.

## 2020-04-26 ENCOUNTER — Other Ambulatory Visit: Admission: RE | Admit: 2020-04-26 | Payer: 59 | Source: Ambulatory Visit

## 2020-04-27 ENCOUNTER — Other Ambulatory Visit
Admission: RE | Admit: 2020-04-27 | Discharge: 2020-04-27 | Disposition: A | Discharge status: Home / Self Care | Payer: 59 | Source: Ambulatory Visit | Attending: Cardiovascular Disease | Admitting: Cardiovascular Disease

## 2020-04-27 DIAGNOSIS — Z01812 Encounter for preprocedural laboratory examination: Secondary | ICD-10-CM | POA: Insufficient documentation

## 2020-04-27 DIAGNOSIS — Z20822 Contact with and (suspected) exposure to covid-19: Secondary | ICD-10-CM | POA: Insufficient documentation

## 2020-04-27 LAB — SARS CORONAVIRUS 2 (TAT 6-24 HRS): SARS Coronavirus 2: NEGATIVE

## 2020-04-30 ENCOUNTER — Ambulatory Visit
Admission: RE | Admit: 2020-04-30 | Discharge: 2020-04-30 | Disposition: A | Discharge status: Home / Self Care | Payer: 59 | Attending: Cardiovascular Disease | Admitting: Cardiovascular Disease

## 2020-04-30 ENCOUNTER — Other Ambulatory Visit: Payer: Self-pay

## 2020-04-30 ENCOUNTER — Encounter
Admission: RE | Disposition: A | Discharge status: Home / Self Care | Payer: Self-pay | Source: Home / Self Care | Attending: Cardiovascular Disease

## 2020-04-30 ENCOUNTER — Encounter: Payer: Self-pay | Admitting: Cardiovascular Disease

## 2020-04-30 DIAGNOSIS — I5020 Unspecified systolic (congestive) heart failure: Secondary | ICD-10-CM

## 2020-04-30 DIAGNOSIS — I110 Hypertensive heart disease with heart failure: Secondary | ICD-10-CM | POA: Insufficient documentation

## 2020-04-30 DIAGNOSIS — Z7982 Long term (current) use of aspirin: Secondary | ICD-10-CM | POA: Diagnosis not present

## 2020-04-30 DIAGNOSIS — I5042 Chronic combined systolic (congestive) and diastolic (congestive) heart failure: Secondary | ICD-10-CM

## 2020-04-30 DIAGNOSIS — Z794 Long term (current) use of insulin: Secondary | ICD-10-CM | POA: Diagnosis not present

## 2020-04-30 DIAGNOSIS — I5022 Chronic systolic (congestive) heart failure: Secondary | ICD-10-CM | POA: Insufficient documentation

## 2020-04-30 DIAGNOSIS — I272 Pulmonary hypertension, unspecified: Secondary | ICD-10-CM

## 2020-04-30 DIAGNOSIS — E785 Hyperlipidemia, unspecified: Secondary | ICD-10-CM | POA: Insufficient documentation

## 2020-04-30 DIAGNOSIS — Z79899 Other long term (current) drug therapy: Secondary | ICD-10-CM | POA: Insufficient documentation

## 2020-04-30 DIAGNOSIS — E119 Type 2 diabetes mellitus without complications: Secondary | ICD-10-CM | POA: Insufficient documentation

## 2020-04-30 DIAGNOSIS — I428 Other cardiomyopathies: Secondary | ICD-10-CM | POA: Diagnosis not present

## 2020-04-30 DIAGNOSIS — Z8249 Family history of ischemic heart disease and other diseases of the circulatory system: Secondary | ICD-10-CM | POA: Insufficient documentation

## 2020-04-30 HISTORY — PX: RIGHT/LEFT HEART CATH AND CORONARY ANGIOGRAPHY: CATH118266

## 2020-04-30 LAB — GLUCOSE, CAPILLARY
Glucose-Capillary: 246 mg/dL — ABNORMAL HIGH (ref 70–99)
Glucose-Capillary: 265 mg/dL — ABNORMAL HIGH (ref 70–99)

## 2020-04-30 SURGERY — RIGHT/LEFT HEART CATH AND CORONARY ANGIOGRAPHY
Anesthesia: Moderate Sedation

## 2020-04-30 MED ORDER — SODIUM CHLORIDE 0.9% FLUSH: 3.0000 mL | INTRAVENOUS | Status: DC | PRN

## 2020-04-30 MED ORDER — VERAPAMIL HCL 2.5 MG/ML IV SOLN
INTRAVENOUS | Status: DC | PRN
  Administered 2020-04-30: 2.5 mg via INTRAVENOUS

## 2020-04-30 MED ORDER — HEPARIN SODIUM (PORCINE) 1000 UNIT/ML IJ SOLN
INTRAMUSCULAR | Status: DC | PRN
  Administered 2020-04-30: 5000 [IU] via INTRAVENOUS

## 2020-04-30 MED ORDER — HEPARIN (PORCINE) IN NACL 1000-0.9 UT/500ML-% IV SOLN
INTRAVENOUS | Status: DC | PRN
  Administered 2020-04-30: 500 mL

## 2020-04-30 MED ORDER — MIDAZOLAM HCL 2 MG/2ML IJ SOLN
INTRAMUSCULAR | Status: AC
  Filled 2020-04-30: qty 2

## 2020-04-30 MED ORDER — HEPARIN (PORCINE) IN NACL 1000-0.9 UT/500ML-% IV SOLN
INTRAVENOUS | Status: AC
  Filled 2020-04-30: qty 1000

## 2020-04-30 MED ORDER — SODIUM CHLORIDE 0.9% FLUSH: 3.0000 mL | Freq: Two times a day (BID) | INTRAVENOUS | Status: DC

## 2020-04-30 MED ORDER — SODIUM CHLORIDE 0.9 % WEIGHT BASED INFUSION: 1.0000 mL/kg/h | INTRAVENOUS | Status: DC

## 2020-04-30 MED ORDER — MIDAZOLAM HCL 2 MG/2ML IJ SOLN
INTRAMUSCULAR | Status: DC | PRN
  Administered 2020-04-30: 1 mg via INTRAVENOUS

## 2020-04-30 MED ORDER — FENTANYL CITRATE (PF) 100 MCG/2ML IJ SOLN
INTRAMUSCULAR | Status: DC | PRN
  Administered 2020-04-30: 50 ug via INTRAVENOUS

## 2020-04-30 MED ORDER — HEPARIN SODIUM (PORCINE) 1000 UNIT/ML IJ SOLN
INTRAMUSCULAR | Status: AC
  Filled 2020-04-30: qty 1

## 2020-04-30 MED ORDER — ASPIRIN 81 MG PO CHEW
CHEWABLE_TABLET | ORAL | Status: AC
  Filled 2020-04-30: qty 1

## 2020-04-30 MED ORDER — SODIUM CHLORIDE 0.9 % WEIGHT BASED INFUSION
3.0000 mL/kg/h | INTRAVENOUS | Status: AC
  Administered 2020-04-30: 3 mL/kg/h via INTRAVENOUS

## 2020-04-30 MED ORDER — IOHEXOL 300 MG/ML  SOLN
INTRAMUSCULAR | Status: DC | PRN
  Administered 2020-04-30: 65 mL

## 2020-04-30 MED ORDER — SODIUM CHLORIDE 0.9 % IV SOLN: 250.0000 mL | INTRAVENOUS | Status: DC | PRN

## 2020-04-30 MED ORDER — ASPIRIN 81 MG PO CHEW
81.0000 mg | CHEWABLE_TABLET | ORAL | Status: AC
  Administered 2020-04-30: 81 mg via ORAL

## 2020-04-30 MED ORDER — FENTANYL CITRATE (PF) 100 MCG/2ML IJ SOLN
INTRAMUSCULAR | Status: AC
  Filled 2020-04-30: qty 2

## 2020-04-30 MED ORDER — VERAPAMIL HCL 2.5 MG/ML IV SOLN
INTRAVENOUS | Status: AC
  Filled 2020-04-30: qty 2

## 2020-04-30 SURGICAL SUPPLY — 12 items
CATH BALLN WEDGE 5F 110CM (CATHETERS) ×1 IMPLANT
CATH INFINITI 5 FR JL3.5 (CATHETERS) ×1 IMPLANT
CATH INFINITI 5FR JK (CATHETERS) ×1 IMPLANT
CATH INFINITI JR4 5F (CATHETERS) ×1 IMPLANT
DEVICE RAD TR BAND REGULAR (VASCULAR PRODUCTS) ×1 IMPLANT
GLIDESHEATH SLEND SS 6F .021 (SHEATH) ×1 IMPLANT
GUIDEWIRE .025 260CM (WIRE) ×1 IMPLANT
GUIDEWIRE INQWIRE 1.5J.035X260 (WIRE) IMPLANT
INQWIRE 1.5J .035X260CM (WIRE) ×2
KIT MANI 3VAL PERCEP (MISCELLANEOUS) ×2 IMPLANT
PACK CARDIAC CATH (CUSTOM PROCEDURE TRAY) ×1 IMPLANT
SHEATH GLIDE SLENDER 4/5FR (SHEATH) ×1 IMPLANT

## 2020-04-30 NOTE — Discharge Instructions (Signed)
Radial Site Care  This sheet gives you information about how to care for yourself after your procedure. Your health care provider may also give you more specific instructions. If you have problems or questions, contact your health care provider. What can I expect after the procedure? After the procedure, it is common to have:  Bruising and tenderness at the catheter insertion area. Follow these instructions at home: Medicines  Take over-the-counter and prescription medicines only as told by your health care provider. Insertion site care  Follow instructions from your health care provider about how to take care of your insertion site. Make sure you: ? Wash your hands with soap and water before you change your bandage (dressing). If soap and water are not available, use hand sanitizer. ? Change your dressing as told by your health care provider. ? Leave stitches (sutures), skin glue, or adhesive strips in place. These skin closures may need to stay in place for 2 weeks or longer. If adhesive strip edges start to loosen and curl up, you may trim the loose edges. Do not remove adhesive strips completely unless your health care provider tells you to do that.  Check your insertion site every day for signs of infection. Check for: ? Redness, swelling, or pain. ? Fluid or blood. ? Pus or a bad smell. ? Warmth.  Do not take baths, swim, or use a hot tub until your health care provider approves.  You may shower 24-48 hours after the procedure, or as directed by your health care provider. ? Remove the dressing and gently wash the site with plain soap and water. ? Pat the area dry with a clean towel. ? Do not rub the site. That could cause bleeding.  Do not apply powder or lotion to the site. Activity   For 24 hours after the procedure, or as directed by your health care provider: ? Do not flex or bend the affected arm. ? Do not push or pull heavy objects with the affected arm. ? Do not  drive yourself home from the hospital or clinic. You may drive 24 hours after the procedure unless your health care provider tells you not to. ? Do not operate machinery or power tools.  Do not lift anything that is heavier than 10 lb (4.5 kg), or the limit that you are told, until your health care provider says that it is safe.  Ask your health care provider when it is okay to: ? Return to work or school. ? Resume usual physical activities or sports. ? Resume sexual activity. General instructions  If the catheter site starts to bleed, raise your arm and put firm pressure on the site. If the bleeding does not stop, get help right away. This is a medical emergency.  If you went home on the same day as your procedure, a responsible adult should be with you for the first 24 hours after you arrive home.  Keep all follow-up visits as told by your health care provider. This is important. Contact a health care provider if:  You have a fever.  You have redness, swelling, or yellow drainage around your insertion site. Get help right away if:  You have unusual pain at the radial site.  The catheter insertion area swells very fast.  The insertion area is bleeding, and the bleeding does not stop when you hold steady pressure on the area.  Your arm or hand becomes pale, cool, tingly, or numb. These symptoms may represent a serious problem   that is an emergency. Do not wait to see if the symptoms will go away. Get medical help right away. Call your local emergency services (911 in the [place] Do not drive yourself to the hospital. Summary  After the procedure, it is common to have bruising and tenderness at the site.  Follow instructions from your health care provider about how to take care of your radial site wound. Check the wound every day for signs of infection.  Do not lift anything that is heavier than 10 lb (4.5 kg), or the limit that you are told, until your health care provider says  that it is safe. This information is not intended to replace advice given to you by your health care provider. Make sure you discuss any questions you have with your health care provider. Document Revised: 01/13/2018 Document Reviewed: 01/13/2018 Elsevier Patient Education  [place].   Coronary Angiogram A coronary angiogram is an X-ray procedure that is used to examine the arteries in the heart. Contrast dye is injected through a long, thin tube (catheter) into these arteries. Then X-rays are taken to show any blockage in these arteries. You may have this procedure if you:  Are having chest pain, or other symptoms of angina, and you are at risk for heart disease.  Have an abnormal stress test or test of your heart's electrical activity (electrocardiogram, or ECG).  Have chest pain and heart failure.  Are having irregular heart rhythms. A coronary angiogram or heart catheterization can show if you have valve disease or a disease of the aorta. This procedure can also be used to check the overall function of your heart muscle. Let your health care provider know about:  Any allergies you have, including allergies to medicines or contrast dye.  All medicines you are taking, including vitamins, herbs, eye drops, creams, and over-the-counter medicines.  Any problems you or family members have had with anesthetic medicines.  Any blood disorders you have.  Any surgeries you have had.  Any history of kidney problems or kidney failure.  Any medical conditions you have.  Whether you are pregnant or may be pregnant.  Whether you are breastfeeding. What are the risks? Generally, this is a safe procedure. However, problems may occur, including:  Infection.  Allergic reaction to medicines or dyes that are used.  Bleeding from the insertion site or other places.  Damage to nearby structures, such as blood vessels, or damage to kidneys from contrast dye.  Irregular heart  rhythms.  Stroke (rare).  Heart attack (rare). What happens before the procedure? Staying hydrated Follow instructions from your health care provider about hydration, which may include:  Up to 2 hours before the procedure - you may continue to drink clear liquids, such as water, clear fruit juice, black coffee, and plain tea.  Eating and drinking restrictions Follow instructions from your health care provider about eating and drinking, which may include:  8 hours before the procedure - stop eating heavy meals or foods, such as meat, fried foods, or fatty foods.  6 hours before the procedure - stop eating light meals or foods, such as toast or cereal.  6 hours before the procedure - stop drinking milk or drinks that contain milk.  2 hours before the procedure - stop drinking clear liquids. Medicines Ask your health care provider about:  Changing or stopping your regular medicines. This is especially important if you are taking diabetes medicines or blood thinners.  Taking medicines such as aspirin and  ibuprofen. These medicines can thin your blood. Do not take these medicines unless your health care provider tells you to take them. Aspirin may be recommended before coronary angiograms even if you do not normally take it.  Taking over-the-counter medicines, vitamins, herbs, and supplements. General instructions  Do not use any products that contain nicotine or tobacco for at least 4 weeks before the procedure. These products include cigarettes, e-cigarettes, and chewing tobacco. If you need help quitting, ask your health care provider.  You may have an exam or testing.  Plan to have someone take you home from the hospital or clinic.  If you will be going home right after the procedure, plan to have someone with you for 24 hours.  Ask your health care provider: ? How your insertion site will be marked. ? What steps will be taken to help prevent infection. These may  include:  Removing hair at the insertion site.  Washing skin with a germ-killing soap.  Taking antibiotic medicine. What happens during the procedure?   You will lie on your back on an X-ray table.  An IV will be inserted into one of your veins.  Electrodes will be placed on your chest.  You will be given one or more of the following: ? A medicine to help you relax (sedative). ? A medicine to numb the catheter insertion area (local anesthetic).  You will be connected to a continuous ECG monitor.  The catheter will be inserted into an artery in one of these areas: ? Your groin area in your upper thigh. ? Your wrist. ? The fold of your arm, near your elbow.  An X-ray procedure (fluoroscopy) will be used to help guide the catheter to the opening of the blood vessel to be used.  A dye will be injected into the catheter and X-rays will be taken. The dye will help to show any narrowing or blockages in the heart arteries.  Tell your health care provider if you have chest pain or trouble breathing.  If blockages are found, another procedure may be done to open the artery.  The catheter will be removed after the fluoroscopy is complete.  A bandage (dressing) will be placed over the insertion site. Pressure will be applied to stop bleeding.  The IV will be removed. The procedure may vary among health care providers and hospitals. What happens after the procedure?  Your blood pressure, heart rate, breathing rate, and blood oxygen level will be monitored until you leave the hospital or clinic.  You will need to lie still for a few hours, or for as long as told by your health care provider. ? If the procedure is done through the groin, you will be told not to bend or cross your legs.  The insertion site and the pulse in your foot or wrist will be checked often.  More blood tests, X-rays, and an ECG may be done.  Do not drive for 24 hours if you were given a sedative during your  procedure. Summary  A coronary angiogram is an X-ray procedure that is used to examine the arteries in the heart.  Contrast dye is injected through a long, thin tube (catheter) into each artery.  Tell your health care provider about any allergies you have, including allergies to contrast dye.  After the procedure, you will need to lie still for a few hours and drink plenty of fluids. This information is not intended to replace advice given to you by your health  care provider. Make sure you discuss any questions you have with your health care provider. Document Revised: 06/30/2019 Document Reviewed: 06/30/2019 Elsevier Patient Education  [place].   Moderate Conscious Sedation, Adult, Care After These instructions provide you with information about caring for yourself after your procedure. Your health care provider may also give you more specific instructions. Your treatment has been planned according to current medical practices, but problems sometimes occur. Call your health care provider if you have any problems or questions after your procedure. What can I expect after the procedure? After your procedure, it is common:  To feel sleepy for several hours.  To feel clumsy and have poor balance for several hours.  To have poor judgment for several hours.  To vomit if you eat too soon. Follow these instructions at home: For at least 24 hours after the procedure:   Do not: ? Participate in activities where you could fall or become injured. ? Drive. ? Use heavy machinery. ? Drink alcohol. ? Take sleeping pills or medicines that cause drowsiness. ? Make important decisions or sign legal documents. ? Take care of children on your own.  Rest. Eating and drinking  Follow the diet recommended by your health care provider.  If you vomit: ? Drink water, juice, or soup when you can drink without vomiting. ? Make sure you have little or no nausea before eating solid  foods. General instructions  Have a responsible adult stay with you until you are awake and alert.  Take over-the-counter and prescription medicines only as told by your health care provider.  If you smoke, do not smoke without supervision.  Keep all follow-up visits as told by your health care provider. This is important. Contact a health care provider if:  You keep feeling nauseous or you keep vomiting.  You feel light-headed.  You develop a rash.  You have a fever. Get help right away if:  You have trouble breathing. This information is not intended to replace advice given to you by your health care provider. Make sure you discuss any questions you have with your health care provider. Document Revised: 11/20/2017 Document Reviewed: 03/29/2016 Elsevier Patient Education  2020 [employer].

## 2020-04-30 NOTE — Interval H&P Note (Signed)
History and Physical Interval Note:  04/30/2020 9:32 AM  Alexander Drop.  has presented today for surgery, with the diagnosis of RT LT Heart Cath   Heart failure with reduced EF.  The various methods of treatment have been discussed with the patient and family. After consideration of risks, benefits and other options for treatment, the patient has consented to  Procedure(s): RIGHT/LEFT HEART CATH AND CORONARY ANGIOGRAPHY (N/A) as a surgical intervention.  The patient's history has been reviewed, patient examined, no change in status, stable for surgery.  I have reviewed the patient's chart and labs.  Questions were answered to the patient's satisfaction.     Kathlyn Sacramento

## 2020-04-30 NOTE — Progress Notes (Signed)
Educated patient and wife prior to d/c to hold metformin for 48 hours.  Vaughan Sine, RN  04/30/2020

## 2020-05-01 ENCOUNTER — Other Ambulatory Visit: Payer: Self-pay | Admitting: Family Medicine

## 2020-05-03 ENCOUNTER — Other Ambulatory Visit: Payer: Self-pay

## 2020-05-07 ENCOUNTER — Other Ambulatory Visit: Payer: Self-pay

## 2020-05-07 ENCOUNTER — Encounter: Payer: Self-pay | Admitting: Internal Medicine

## 2020-05-07 ENCOUNTER — Ambulatory Visit (INDEPENDENT_AMBULATORY_CARE_PROVIDER_SITE_OTHER): Payer: 59 | Admitting: Internal Medicine

## 2020-05-07 VITALS — BP 130/70 | HR 95 | Ht 69.0 in | Wt 235.0 lb

## 2020-05-07 DIAGNOSIS — E7800 Pure hypercholesterolemia, unspecified: Secondary | ICD-10-CM

## 2020-05-07 DIAGNOSIS — Z6834 Body mass index (BMI) 34.0-34.9, adult: Secondary | ICD-10-CM

## 2020-05-07 DIAGNOSIS — E1159 Type 2 diabetes mellitus with other circulatory complications: Secondary | ICD-10-CM

## 2020-05-07 DIAGNOSIS — E1165 Type 2 diabetes mellitus with hyperglycemia: Secondary | ICD-10-CM

## 2020-05-07 DIAGNOSIS — E669 Obesity, unspecified: Secondary | ICD-10-CM | POA: Diagnosis not present

## 2020-05-07 LAB — POCT GLYCOSYLATED HEMOGLOBIN (HGB A1C): Hemoglobin A1C: 10.2 % — AB (ref 4.0–5.6)

## 2020-05-07 MED ORDER — LANTUS SOLOSTAR 100 UNIT/ML ~~LOC~~ SOPN: 20.0000 [IU] | PEN_INJECTOR | Freq: Every day | SUBCUTANEOUS | 4 refills | Status: DC

## 2020-05-07 NOTE — Patient Instructions (Addendum)
Please continue: - Metformin 1000 mg 2x a day, with meals - Glipizide 20 mg 2x a day before meals - Ozempic 1 mg weekly  Please increase: - Lantus to 18 units daily   Please come back for a follow-up appointment in 3 months.

## 2020-05-07 NOTE — Progress Notes (Signed)
Patient ID: Alexander Linarez., male   DOB: 1966-01-09, 54 y.o.   MRN: [id number]   This visit occurred during the SARS-CoV-2 public health emergency.  Safety protocols were in place, including screening questions prior to the visit, additional usage of staff PPE, and extensive cleaning of exam room while observing appropriate contact time as indicated for disinfecting solutions.    HPI: Alexander Doolin. is a 54 y.o.-year-old male, initially referred by his PCP,  Rutherford Guys, MD,  returning for follow-up for DM2, dx in 2005, insulin-dependent, uncontrolled, with complications (CHF, PN, ED - saw Dr. Loanne Drilling for this in 2014).  Last visit 3 months ago.  He was recently admitted for heart catheterization on 04/30/2020.  No significant blockages were found.  Actos was stopped at that time.   Reviewed HbA1c levels: Lab Results  Component Value Date   HGBA1C 10.8 (A) 02/06/2020   HGBA1C 9.7 (A) 11/04/2019   HGBA1C 12.1 (H) 05/30/2019   HGBA1C 11.9 (A) 08/16/2018   HGBA1C 14.0 (A) 05/28/2018   HGBA1C 12.1 (H) 11/23/2017   HGBA1C 11.9 10/30/2016   HGBA1C 11.2 (H) 10/21/2016   HGBA1C 11.4 03/08/2016   HGBA1C 13.6 07/22/2015   Pt is on a regimen of: - Metformin 1000 mg 2x a day, with meals  stopped by cardiology - Glipizide 10 >> 20 mg 2x a day before meals - Ozempic 1 mg weekly - - recommended 05/2019 - discussed with DOT >> approved, but he did not start it... 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  He used the CGM before but ran out of sensors.  Pt checks his sugars 1-2 times a day: - am: 200-250 >> 125, 175-250 >> 130, 150-175, 200 >> 100-180 >> 115-180 - 2h after b'fast: n/c - before lunch: n/c - 2h after lunch: n/c - before dinner: n/c - 2h after dinner: 200s >> 200-300 >> 200-225 >> 180-200s >> 120-130 - bedtime: n/c - nighttime: n/c Lowest sugar was 150 >> 125 >> 130 >> 100 >>  115; it is unclear at which level he has hypoglycemia awareness. Highest sugar was 280 >> 300...>> 230 (sweets at night).  Glucometer: ReliOn  Pt's meals are: - Breakfast: 2 eggs, 2 bacon pieces, OJ - Lunch: fast food, grilled fish + green beans, cabbage, corn, no bread, unsweet tea - Dinner: Fish or chicken + veggies - Snacks: fruit, nuts  -No CKD, last BUN/creatinine:  Lab Results  Component Value Date   BUN 20 04/09/2020   BUN 16 03/19/2020   CREATININE 0.86 04/09/2020   CREATININE 0.85 03/19/2020  On Cozaar 100.  -+ HL; last set of lipids: Lab Results  Component Value Date   CHOL 135 03/19/2020   HDL 66 03/19/2020   LDLCALC 47 03/19/2020   TRIG 126 03/19/2020   CHOLHDL 2.0 03/19/2020  On Zocor 40.  - last eye exam was in 01/2020: No DR  - no numbness and tingling in his feet.  On ASA 81.  Reviewed the report of his catheterization from 04/30/2020:  There is moderate to severe left ventricular systolic dysfunction.  LV end diastolic pressure is moderately elevated.  The left ventricular ejection fraction is 25-35% by visual estimate.   1.  Minimal irregularities with no evidence of obstructive coronary artery disease. 2.  Moderately to severely reduced LV systolic function with an ejection fraction of 30%.  3.  Right heart catheterization showed mildly elevated left-sided filling pressures with wedge pressure of 19 mmHg, moderate pulmonary hypertension at 50/23 with a mean of 35 mmHg and normal cardiac output at 6.79 with a cardiac index of 3.11.  Pulmonary vascular resistance of 2.36 Woods unit.  Recommendations: The patient has nonischemic cardiomyopathy for which I recommend continuing medical therapy. He is mildly volume overloaded which could be in part related to the preprocedure hydration.  ROS: Constitutional: no weight gain/no weight loss, no fatigue, no subjective hyperthermia, no subjective hypothermia Eyes: no blurry vision, no xerophthalmia ENT:  no sore throat, no nodules palpated in neck, no dysphagia, no odynophagia, no hoarseness Cardiovascular: no CP/no SOB/no palpitations/no leg swelling Respiratory: no cough/no SOB/no wheezing Gastrointestinal: no N/no V/no D/no C/no acid reflux Musculoskeletal: no muscle aches/no joint aches Skin: no rashes, no hair loss Neurological: no tremors/no numbness/no tingling/no dizziness  I reviewed pt's medications, allergies, PMH, social hx, family hx, and changes were documented in the history of present illness. Otherwise, unchanged from my initial visit note.  Past Medical History:  Diagnosis Date  . Diabetes mellitus without complication ([place])   . HFrEF (heart failure with reduced ejection fraction) ([place]) 03/2020   Echo 03/2020 LVEF 30-35%  . Hyperlipidemia   . Hypertension    Past Surgical History:  Procedure Laterality Date  . I & D EXTREMITY Left 11/24/2017   Procedure: IRRIGATION AND DEBRIDEMENT LEFT THIGH ABSCESS;  Surgeon: Erroll Luna, MD;  Location: [place];  Service: General;  Laterality: Left;  . ORIF HIP FRACTURE Right   . RIGHT/LEFT HEART CATH AND CORONARY ANGIOGRAPHY N/A 04/30/2020   Procedure: RIGHT/LEFT HEART CATH AND CORONARY ANGIOGRAPHY;  Surgeon: Wellington Hampshire, MD;  Location: [place] [facility];  Service: Cardiovascular;  Laterality: N/A;   Social History   Socioeconomic History  . Marital status: Married    Spouse name: Thayer Headings  . Number of children: 1  . Years of education: college  . Highest education level: Not on file  Occupational History  . Occupation: Self employed  Tobacco Use  . Smoking status: Never Smoker  . Smokeless tobacco: Never Used  Substance and Sexual Activity  . Alcohol use: Yes    Alcohol/week: 2.0 standard drinks    Types: 2 Standard drinks or equivalent per week    Comment: social   . Drug use: No  . Sexual activity: Yes    Partners: Female  Other Topics Concern  . Not on file  Social History Narrative   Lives with wife.      Social Determinants of Health   Financial Resource Strain:   . Difficulty of Paying Living Expenses:   Food Insecurity:   . Worried About Charity fundraiser in the Last Year:   . Arboriculturist in the Last Year:   Transportation Needs:   . Film/video editor (Medical):   Marland Kitchen Lack of Transportation (Non-Medical):   Physical Activity:   . Days of Exercise per Week:   . Minutes of Exercise per Session:   Stress:   . Feeling of Stress :   Social Connections:   . Frequency of Communication with Friends and Family:   . Frequency of Social Gatherings with Friends and Family:   . Attends Religious Services:   . Active Member of Clubs or Organizations:   . Attends Archivist Meetings:   Marland Kitchen Marital Status:   Intimate Partner Violence:   . Fear of Current or Ex-Partner:   .  Emotionally Abused:   Marland Kitchen Physically Abused:   . Sexually Abused:    Current Outpatient Medications on File Prior to Visit  Medication Sig Dispense Refill  . albuterol (VENTOLIN HFA) 108 (90 Base) MCG/ACT inhaler Inhale 2 puffs into the lungs every 4 (four) hours as needed for wheezing or shortness of breath.     Marland Kitchen aspirin 81 MG tablet Take 1 tablet (81 mg total) by mouth every other day. 30 tablet 11  . carvedilol (COREG) 6.25 MG tablet Take 1 tablet (6.25 mg total) by mouth 2 (two) times daily. 60 tablet 0  . clotrimazole (LOTRIMIN) 1 % cream APPLY TO AFFECTED AREA TWICE A DAY *NOT COVERED* (Patient taking differently: Apply 1 application topically 2 (two) times daily. ) 30 g 0  . furosemide (LASIX) 20 MG tablet Take 1 tablet (20 mg total) by mouth daily. 30 tablet 0  . glipiZIDE (GLUCOTROL) 10 MG tablet TAKE 2 TABLETS BY MOUTH 2  TIMES DAILY BEFORE MEALS (Patient taking differently: Take 20 mg by mouth 2 (two) times daily before a meal. TAKE 2 TABLETS BY MOUTH 2  TIMES DAILY BEFORE MEALS) 360 tablet 3  . insulin glargine (LANTUS SOLOSTAR) 100 UNIT/ML Solostar Pen Inject 14 Units into the skin at bedtime.  15 mL 4  . Insulin Pen Needle 32G X 4 MM MISC Use 1x a day 100 each 3  . losartan (COZAAR) 100 MG tablet TAKE 1 TABLET BY MOUTH  DAILY 90 tablet 3  . metFORMIN (GLUCOPHAGE) 1000 MG tablet TAKE 1 TABLET BY MOUTH TWICE A DAY WITH MEALS 180 tablet 0  . Multiple Vitamin (MULTIVITAMIN) tablet Take 1 tablet by mouth daily.    . Semaglutide, 1 MG/DOSE, (OZEMPIC, 1 MG/DOSE,) 2 MG/1.5ML SOPN Inject 1 mg into the skin once a week. 6 pen 1  . sildenafil (VIAGRA) 100 MG tablet TAKE 1 TABLET BY MOUTH AS  NEEDED FOR ERECTILE  DYSFUNCTION AS DIRECTED 3 tablet 0  . simvastatin (ZOCOR) 40 MG tablet TAKE 1 TABLET BY MOUTH  DAILY 90 tablet 3  . spironolactone (ALDACTONE) 25 MG tablet Take 1 tablet (25 mg total) by mouth daily. 30 tablet 0  . [DISCONTINUED] sildenafil (VIAGRA) 100 MG tablet TAKE 1 TABLET BY MOUTH AS  NEEDED FOR ERECTILE  DYSFUNCTION AS DIRECTED 4 tablet 0   No current facility-administered medications on file prior to visit.   No Known Allergies Family History  Problem Relation Age of Onset  . Diabetes Father   . Heart attack Father 70       Died age 61 MI    PE: BP 130/70   Pulse 95   Ht 5\' 9"  (1.753 m)   Wt 235 lb (106.6 kg)   SpO2 97%   BMI 34.70 kg/m  Wt Readings from Last 3 Encounters:  05/07/20 235 lb (106.6 kg)  04/30/20 228 lb (103.4 kg)  04/09/20 233 lb 4 oz (105.8 kg)   Constitutional: overweight, in NAD Eyes: PERRLA, EOMI, no exophthalmos ENT: moist mucous membranes, no thyromegaly, no cervical lymphadenopathy Cardiovascular: RRR, No MRG Respiratory: CTA B Gastrointestinal: abdomen soft, NT, ND, BS+ Musculoskeletal: no deformities, strength intact in all 4 Skin: moist, warm, no rashes Neurological: no tremor with outstretched hands, DTR normal in all 4  ASSESSMENT: 1. DM2, insulin-dependent, uncontrolled, with long-term complications - CHF - PN - ED  2. HL  3. Obesity class I  PLAN:  1. Patient with longstanding diabetes, on oral antidiabetic regimen  with Metformin, TCD, sulfonylurea, and also  weekly GLP-1 receptor agonist, with persistently elevated blood sugars and an HbA1c of 10.8% at last visit.  At that time, I again advised him to start insulin.  I recommended a low dose and increase as needed.  This is the third visit at which I recommended insulin.  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  At last visit, I advised him that if he does not start insulin, I would not be able to help him going further.  He did start insulin after our last visit.  Increase the dose of Lantus from 10 units to 14 units, which he continues now.  He tolerates it well. -At this visit, sugars are better after dinner, however, they are still high in the morning.  He mentions that the sugars would be better in the morning if he stopped eating after 8 PM.  He is working on doing so.  For now, since the sugars in the morning are still higher than target, I advised him to increase his insulin dose. -He inquires about possibly stopping Metformin, but I advised him to continue and explained why this and the rest of the medications are needed.  Also, at next visit, due to his history of CHF, will try to add an SGLT 2 inhibitor. - I suggested to:  Patient Instructions  Please continue: - Metformin 1000 mg 2x a day, with meals - Glipizide 20 mg 2x a day before meals - Ozempic 1 mg weekly  Please increase: - Lantus to 18 units daily   Please come back for a follow-up appointment in 3 months.  - we checked his HbA1c: 10.2% (only slightly improved) - advised to check sugars at different times of the day - 1-2x a day, rotating check times - advised for yearly eye exams >> he is UTD - return to clinic in 3 months  2. HL -Reviewed latest lipid panel from 02/2020: All fractions at goal Lab Results  Component Value Date   CHOL 135 03/19/2020   HDL 66 03/19/2020   LDLCALC 47 03/19/2020   TRIG 126 03/19/2020   CHOLHDL 2.0 03/19/2020   -Continue Zocor without side effects  3. Obesity class I -Continue Ozempic which should also help with weight loss -I am hoping we can stop glipizide in the near future since these are also weight gain inducing, as his insulin -His weight is approximately stable since last visit.   Philemon Kingdom, MD PhD [facility] Endocrinology

## 2020-05-07 NOTE — Addendum Note (Signed)
Addended by: Cardell Peach I on: 05/07/2020 10:43 AM   Modules accepted: Orders

## 2020-05-14 ENCOUNTER — Other Ambulatory Visit: Payer: Self-pay

## 2020-05-14 ENCOUNTER — Ambulatory Visit (INDEPENDENT_AMBULATORY_CARE_PROVIDER_SITE_OTHER): Payer: 59 | Admitting: Cardiology

## 2020-05-14 ENCOUNTER — Encounter: Payer: Self-pay | Admitting: Cardiology

## 2020-05-14 VITALS — BP 120/80 | HR 100 | Ht 69.0 in | Wt 231.0 lb

## 2020-05-14 DIAGNOSIS — E7800 Pure hypercholesterolemia, unspecified: Secondary | ICD-10-CM | POA: Diagnosis not present

## 2020-05-14 DIAGNOSIS — I10 Essential (primary) hypertension: Secondary | ICD-10-CM

## 2020-05-14 DIAGNOSIS — I428 Other cardiomyopathies: Secondary | ICD-10-CM

## 2020-05-14 MED ORDER — CARVEDILOL 12.5 MG PO TABS
12.5000 mg | ORAL_TABLET | Freq: Two times a day (BID) | ORAL | 3 refills | Status: DC
Start: 2020-05-14 — End: 2020-06-18

## 2020-05-14 MED ORDER — FUROSEMIDE 20 MG PO TABS: 20.0000 mg | ORAL_TABLET | Freq: Every day | ORAL | 0 refills | Status: DC

## 2020-05-14 NOTE — Progress Notes (Signed)
Cardiology Office Note:    Date:  05/14/2020   ID:  Alexander Drop., DOB 09/09/1966, MRN [id number]  PCP:  Rutherford Guys, MD  Cardiologist:  Kate Sable, MD  Electrophysiologist:  None   Referring MD: Rutherford Guys, MD   Chief Complaint  Patient presents with  . Other    Follow up post cath. meds reviewed verbally with patient.      History of Present Illness:    Alexander Abdullahi. is a 54 y.o. male with a hx of diabetes, heart failure reduced ejection fraction, EF 30 to 35%, hyperlipidemia, hypertension who presents for follow-up.  He was last seen due to moderate to severely reduced ejection fraction.  Left heart cath was ordered to evaluate presence of CAD.  Currently on Coreg, losartan 100, spironolactone 25 mg daily, Lasix 20 mg daily.  He states feeling well, denies chest pain or shortness of breath.   Past Medical History:  Diagnosis Date  . Diabetes mellitus without complication ([place])   . HFrEF (heart failure with reduced ejection fraction) ([place]) 03/2020   Echo 03/2020 LVEF 30-35%  . Hyperlipidemia   . Hypertension     Past Surgical History:  Procedure Laterality Date  . I & D EXTREMITY Left 11/24/2017   Procedure: IRRIGATION AND DEBRIDEMENT LEFT THIGH ABSCESS;  Surgeon: Erroll Luna, MD;  Location: [place];  Service: General;  Laterality: Left;  . ORIF HIP FRACTURE Right   . RIGHT/LEFT HEART CATH AND CORONARY ANGIOGRAPHY N/A 04/30/2020   Procedure: RIGHT/LEFT HEART CATH AND CORONARY ANGIOGRAPHY;  Surgeon: Wellington Hampshire, MD;  Location: [place] [facility];  Service: Cardiovascular;  Laterality: N/A;    Current Medications: Current Meds  Medication Sig  . albuterol (VENTOLIN HFA) 108 (90 Base) MCG/ACT inhaler Inhale 2 puffs into the lungs every 4 (four) hours as needed for wheezing or shortness of breath.   Marland Kitchen aspirin 81 MG tablet Take 1 tablet (81 mg total) by mouth every other day.  . clotrimazole (LOTRIMIN) 1 % cream APPLY TO AFFECTED AREA TWICE A DAY  *NOT COVERED* (Patient taking differently: Apply 1 application topically 2 (two) times daily. )  . furosemide (LASIX) 20 MG tablet Take 1 tablet (20 mg total) by mouth daily.  Marland Kitchen glipiZIDE (GLUCOTROL) 10 MG tablet TAKE 2 TABLETS BY MOUTH 2  TIMES DAILY BEFORE MEALS (Patient taking differently: Take 20 mg by mouth 2 (two) times daily before a meal. TAKE 2 TABLETS BY MOUTH 2  TIMES DAILY BEFORE MEALS)  . insulin glargine (LANTUS SOLOSTAR) 100 UNIT/ML Solostar Pen Inject 20 Units into the skin at bedtime.  . Insulin Pen Needle 32G X 4 MM MISC Use 1x a day  . losartan (COZAAR) 100 MG tablet TAKE 1 TABLET BY MOUTH  DAILY  . metFORMIN (GLUCOPHAGE) 1000 MG tablet TAKE 1 TABLET BY MOUTH TWICE A DAY WITH MEALS  . Multiple Vitamin (MULTIVITAMIN) tablet Take 1 tablet by mouth daily.  . Semaglutide, 1 MG/DOSE, (OZEMPIC, 1 MG/DOSE,) 2 MG/1.5ML SOPN Inject 1 mg into the skin once a week.  . sildenafil (VIAGRA) 100 MG tablet TAKE 1 TABLET BY MOUTH AS  NEEDED FOR ERECTILE  DYSFUNCTION AS DIRECTED  . simvastatin (ZOCOR) 40 MG tablet TAKE 1 TABLET BY MOUTH  DAILY  . spironolactone (ALDACTONE) 25 MG tablet Take 1 tablet (25 mg total) by mouth daily.  . [DISCONTINUED] carvedilol (COREG) 6.25 MG tablet Take 1 tablet (6.25 mg total) by mouth 2 (two) times daily.  . [DISCONTINUED] furosemide (  LASIX) 20 MG tablet Take 1 tablet (20 mg total) by mouth daily.     Allergies:   Patient has no known allergies.   Social History   Socioeconomic History  . Marital status: Married    Spouse name: Thayer Headings  . Number of children: 1  . Years of education: college  . Highest education level: Not on file  Occupational History  . Occupation: Self employed  Tobacco Use  . Smoking status: Never Smoker  . Smokeless tobacco: Never Used  Substance and Sexual Activity  . Alcohol use: Yes    Alcohol/week: 2.0 standard drinks    Types: 2 Standard drinks or equivalent per week    Comment: social   . Drug use: No  . Sexual  activity: Yes    Partners: Female  Other Topics Concern  . Not on file  Social History Narrative   Lives with wife.     Social Determinants of Health   Financial Resource Strain:   . Difficulty of Paying Living Expenses:   Food Insecurity:   . Worried About Charity fundraiser in the Last Year:   . Arboriculturist in the Last Year:   Transportation Needs:   . Film/video editor (Medical):   Marland Kitchen Lack of Transportation (Non-Medical):   Physical Activity:   . Days of Exercise per Week:   . Minutes of Exercise per Session:   Stress:   . Feeling of Stress :   Social Connections:   . Frequency of Communication with Friends and Family:   . Frequency of Social Gatherings with Friends and Family:   . Attends Religious Services:   . Active Member of Clubs or Organizations:   . Attends Archivist Meetings:   Marland Kitchen Marital Status:      Family History: The patient's family history includes Diabetes in his father; Heart attack (age of onset: 58) in his father.  ROS:   Please see the history of present illness.     All other systems reviewed and are negative.  EKGs/Labs/Other Studies Reviewed:    The following studies were reviewed today: LHC May 28, 2020  1.  Minimal irregularities with no evidence of obstructive coronary artery disease. 2.  Moderately to severely reduced LV systolic function with an ejection fraction of 30%. 3.  Right heart catheterization showed mildly elevated left-sided filling pressures with wedge pressure of 19 mmHg, moderate pulmonary hypertension at 50/23 with a mean of 35 mmHg and normal cardiac output at 6.79 with a cardiac index of 3.11.  Pulmonary vascular resistance of 2.36 Woods unit.  Recommendations: The patient has nonischemic cardiomyopathy for which I recommend continuing medical therapy. He is mildly volume overloaded which could be in part related to the preprocedure hydration.  EKG:  EKG is  ordered today.  The ekg ordered today  demonstrates normal sinus rhythm, nonspecific T wave changes.  Recent Labs: 03/19/2020: ALT 13; TSH 1.550 04/09/2020: BUN 20; Creatinine, Ser 0.86; Hemoglobin 13.9; Platelets 246; Potassium 4.1; Sodium 139  Recent Lipid Panel    Component Value Date/Time   CHOL 135 03/19/2020 0856   TRIG 126 03/19/2020 0856   HDL 66 03/19/2020 0856   CHOLHDL 2.0 03/19/2020 0856   CHOLHDL 2.5 10/21/2016 1223   VLDL 31 (H) 10/21/2016 1223   LDLCALC 47 03/19/2020 0856    Physical Exam:    VS:  BP 120/80 (BP Location: Left Arm, Patient Position: Sitting, Cuff Size: Normal)   Pulse 100   Ht 5\' 9"  (  1.753 m)   Wt 231 lb (104.8 kg)   SpO2 96%   BMI 34.11 kg/m     Wt Readings from Last 3 Encounters:  05/14/20 231 lb (104.8 kg)  05/07/20 235 lb (106.6 kg)  04/30/20 228 lb (103.4 kg)     GEN:  Well nourished, well developed in no acute distress HEENT: Normal NECK: No JVD; No carotid bruits LYMPHATICS: No lymphadenopathy CARDIAC: RRR, no murmurs, rubs, gallops RESPIRATORY:  Clear to auscultation without rales, wheezing or rhonchi  ABDOMEN: Soft, non-tender, non-distended MUSCULOSKELETAL:  No edema; No deformity  SKIN: Warm and dry NEUROLOGIC:  Alert and oriented x 3 PSYCHIATRIC:  Normal affect   ASSESSMENT:    1. NICM (nonischemic cardiomyopathy) ([place])   2. Essential hypertension   3. Pure hypercholesterolemia    PLAN:    In order of problems listed above:  1. Patient with nonischemic cardiomyopathy, last EF 30 to 35%, left heart cath with no obstructive coronary artery disease.  Patient describes NYHA class II symptoms.  Patient is euvolemic.  Increase Coreg to 12.5 mg twice daily, continue losartan 100 mg, spironolactone 25 mg, Lasix 20 mg daily.  We discussed possibility of switching to [facility], but patient would like to stay with losartan due to cost which I think is reasonable.  We will continue medication titration and plan for repeat echocardiogram in roughly 3 months. 2. History of  hypertension, blood pressure well control.  Continue current BP therapy as above. 3. History of hyperlipidemia, continue statin.  Follow-up in 1 month for medication titration.  Total encounter time 45 minutes  Greater than 50% was spent in counseling and coordination of care with the patient Time spent counseling patient on medication adherence, etiologies for heart failure, guideline directed medical therapy for heart failure.  This note was generated in part or whole with voice recognition software. Voice recognition is usually quite accurate but there are transcription errors that can and very often do occur. I apologize for any typographical errors that were not detected and corrected.  Medication Adjustments/Labs and Tests Ordered: Current medicines are reviewed at length with the patient today.  Concerns regarding medicines are outlined above.  Orders Placed This Encounter  Procedures  . EKG 12-Lead   Meds ordered this encounter  Medications  . furosemide (LASIX) 20 MG tablet    Sig: Take 1 tablet (20 mg total) by mouth daily.    Dispense:  90 tablet    Refill:  0  . carvedilol (COREG) 12.5 MG tablet    Sig: Take 1 tablet (12.5 mg total) by mouth 2 (two) times daily.    Dispense:  180 tablet    Refill:  3    Patient Instructions  Medication Instructions:  Your physician has recommended you make the following change in your medication:  Take Coreg (Carvedilol) 12.5mg , one tablet twice a day.  *If you need a refill on your cardiac medications before your next appointment, please call your pharmacy*   Lab Work: None Ordered. If you have labs (blood work) drawn today and your tests are completely normal, you will receive your results only by: Marland Kitchen MyChart Message (if you have MyChart) OR . A paper copy in the mail If you have any lab test that is abnormal or we need to change your treatment, we will call you to review the results.   Testing/Procedures: None  Ordered.   Follow-Up: At [facility], you and your health needs are our priority.  As part of our  continuing mission to provide you with exceptional heart care, we have created designated Provider Care Teams.  These Care Teams include your primary Cardiologist (physician) and Advanced Practice Providers (APPs -  Physician Assistants and Nurse Practitioners) who all work together to provide you with the care you need, when you need it.  We recommend signing up for the patient portal called "MyChart".  Sign up information is provided on this After Visit Summary.  MyChart is used to connect with patients for Virtual Visits (Telemedicine).  Patients are able to view lab/test results, encounter notes, upcoming appointments, etc.  Non-urgent messages can be sent to your provider as well.   To learn more about what you can do with MyChart, go to [email or web address].    Your next appointment:   1 month(s)  The format for your next appointment:   In Person  Provider:   Kate Sable, MD   Other Instructions N/A    Signed, Kate Sable, MD  05/14/2020 4:27 PM    [place]

## 2020-05-14 NOTE — Patient Instructions (Signed)
Medication Instructions:  Your physician has recommended you make the following change in your medication:  Take Coreg (Carvedilol) 12.5mg , one tablet twice a day.  *If you need a refill on your cardiac medications before your next appointment, please call your pharmacy*   Lab Work: None Ordered. If you have labs (blood work) drawn today and your tests are completely normal, you will receive your results only by: Marland Kitchen MyChart Message (if you have MyChart) OR . A paper copy in the mail If you have any lab test that is abnormal or we need to change your treatment, we will call you to review the results.   Testing/Procedures: None Ordered.   Follow-Up: At [facility], you and your health needs are our priority.  As part of our continuing mission to provide you with exceptional heart care, we have created designated Provider Care Teams.  These Care Teams include your primary Cardiologist (physician) and Advanced Practice Providers (APPs -  Physician Assistants and Nurse Practitioners) who all work together to provide you with the care you need, when you need it.  We recommend signing up for the patient portal called "MyChart".  Sign up information is provided on this After Visit Summary.  MyChart is used to connect with patients for Virtual Visits (Telemedicine).  Patients are able to view lab/test results, encounter notes, upcoming appointments, etc.  Non-urgent messages can be sent to your provider as well.   To learn more about what you can do with MyChart, go to [email or web address].    Your next appointment:   1 month(s)  The format for your next appointment:   In Person  Provider:   Kate Sable, MD   Other Instructions N/A

## 2020-06-10 ENCOUNTER — Other Ambulatory Visit: Payer: Self-pay | Admitting: Internal Medicine

## 2020-06-11 ENCOUNTER — Other Ambulatory Visit: Payer: Self-pay | Admitting: Family Medicine

## 2020-06-11 DIAGNOSIS — E785 Hyperlipidemia, unspecified: Secondary | ICD-10-CM

## 2020-06-11 DIAGNOSIS — E119 Type 2 diabetes mellitus without complications: Secondary | ICD-10-CM

## 2020-06-11 MED ORDER — METFORMIN HCL 1000 MG PO TABS: ORAL_TABLET | ORAL | 0 refills | Status: DC

## 2020-06-11 NOTE — Telephone Encounter (Signed)
Medication Refill - Medication: metFORMIN (GLUCOPHAGE) 1000 MG tablet    Preferred Pharmacy (with phone number or street name):  [id number] [name], [place] Phone:  [phone]  Fax:  [phone]       Agent: Please be advised that RX refills may take up to 3 business days. We ask that you follow-up with your pharmacy.

## 2020-06-18 ENCOUNTER — Ambulatory Visit (INDEPENDENT_AMBULATORY_CARE_PROVIDER_SITE_OTHER): Payer: 59 | Admitting: Family Medicine

## 2020-06-18 ENCOUNTER — Encounter: Payer: Self-pay | Admitting: Cardiology

## 2020-06-18 ENCOUNTER — Encounter: Payer: Self-pay | Admitting: Family Medicine

## 2020-06-18 ENCOUNTER — Other Ambulatory Visit: Payer: Self-pay

## 2020-06-18 ENCOUNTER — Ambulatory Visit (INDEPENDENT_AMBULATORY_CARE_PROVIDER_SITE_OTHER): Payer: 59 | Admitting: Cardiology

## 2020-06-18 VITALS — BP 140/90 | HR 86 | Ht 69.0 in | Wt 233.2 lb

## 2020-06-18 VITALS — BP 115/80 | HR 89 | Temp 97.8°F | Resp 15 | Ht 69.0 in | Wt 234.2 lb

## 2020-06-18 DIAGNOSIS — E7800 Pure hypercholesterolemia, unspecified: Secondary | ICD-10-CM | POA: Diagnosis not present

## 2020-06-18 DIAGNOSIS — I10 Essential (primary) hypertension: Secondary | ICD-10-CM | POA: Diagnosis not present

## 2020-06-18 DIAGNOSIS — I428 Other cardiomyopathies: Secondary | ICD-10-CM | POA: Diagnosis not present

## 2020-06-18 DIAGNOSIS — E1165 Type 2 diabetes mellitus with hyperglycemia: Secondary | ICD-10-CM

## 2020-06-18 DIAGNOSIS — Z1211 Encounter for screening for malignant neoplasm of colon: Secondary | ICD-10-CM

## 2020-06-18 DIAGNOSIS — E1142 Type 2 diabetes mellitus with diabetic polyneuropathy: Secondary | ICD-10-CM

## 2020-06-18 DIAGNOSIS — IMO0002 Reserved for concepts with insufficient information to code with codable children: Secondary | ICD-10-CM

## 2020-06-18 MED ORDER — FUROSEMIDE 20 MG PO TABS: 20.0000 mg | ORAL_TABLET | Freq: Every day | ORAL | 2 refills | Status: DC

## 2020-06-18 MED ORDER — LOSARTAN POTASSIUM 100 MG PO TABS: 100.0000 mg | ORAL_TABLET | Freq: Every day | ORAL | 2 refills | Status: DC

## 2020-06-18 MED ORDER — CARVEDILOL 12.5 MG PO TABS: 12.5000 mg | ORAL_TABLET | Freq: Two times a day (BID) | ORAL | 2 refills | Status: DC

## 2020-06-18 MED ORDER — SIMVASTATIN 40 MG PO TABS: 40.0000 mg | ORAL_TABLET | Freq: Every day | ORAL | 2 refills | Status: DC

## 2020-06-18 MED ORDER — SPIRONOLACTONE 25 MG PO TABS: 25.0000 mg | ORAL_TABLET | Freq: Every day | ORAL | 2 refills | Status: DC

## 2020-06-18 NOTE — Patient Instructions (Signed)
Medication Instructions:   Your physician recommends that you continue on your current medications as directed. Please refer to the Current Medication list given to you today.  *If you need a refill on your cardiac medications before your next appointment, please call your pharmacy*   Lab Work: None Ordered If you have labs (blood work) drawn today and your tests are completely normal, you will receive your results only by: Marland Kitchen MyChart Message (if you have MyChart) OR . A paper copy in the mail If you have any lab test that is abnormal or we need to change your treatment, we will call you to review the results.   Testing/Procedures:  Your physician has requested that you have an echocardiogram in 2 months . Echocardiography is a painless test that uses sound waves to create images of your heart. It provides your doctor with information about the size and shape of your heart and how well your heart's chambers and valves are working. This procedure takes approximately one hour. There are no restrictions for this procedure.   Follow-Up: At [facility], you and your health needs are our priority.  As part of our continuing mission to provide you with exceptional heart care, we have created designated Provider Care Teams.  These Care Teams include your primary Cardiologist (physician) and Advanced Practice Providers (APPs -  Physician Assistants and Nurse Practitioners) who all work together to provide you with the care you need, when you need it.  We recommend signing up for the patient portal called "MyChart".  Sign up information is provided on this After Visit Summary.  MyChart is used to connect with patients for Virtual Visits (Telemedicine).  Patients are able to view lab/test results, encounter notes, upcoming appointments, etc.  Non-urgent messages can be sent to your provider as well.   To learn more about what you can do with MyChart, go to [email or web address].    Your next  appointment:   After follow up echo in 2 months   The format for your next appointment:   In Person  Provider:   Kate Sable, MD   Other Instructions  N/A

## 2020-06-18 NOTE — Patient Instructions (Signed)
° ° ° °  If you have lab work done today you will be contacted with your lab results within the next 2 weeks.  If you have not heard from us then please contact us. The fastest way to get your results is to register for My Chart. ° ° °IF you received an x-ray today, you will receive an invoice from [facility]. Please contact [facility] at [phone] with questions or concerns regarding your invoice.  ° °IF you received labwork today, you will receive an invoice from [facility]. Please contact [facility] at [phone] with questions or concerns regarding your invoice.  ° °Our billing staff will not be able to assist you with questions regarding bills from these companies. ° °You will be contacted with the lab results as soon as they are available. The fastest way to get your results is to activate your My Chart account. Instructions are located on the last page of this paperwork. If you have not heard from us regarding the results in 2 weeks, please contact this office. °  ° ° ° °

## 2020-06-18 NOTE — Progress Notes (Signed)
Cardiology Office Note:    Date:  06/18/2020   ID:  Alexander Drop., DOB May 21, 1966, MRN [id number]  PCP:  Rutherford Guys, MD  Cardiologist:  Kate Sable, MD  Electrophysiologist:  None   Referring MD: Rutherford Guys, MD   Chief Complaint  Patient presents with  . OTHER    1 month f/u c/o left leg pain. Meds reviewed verbally with pt.     History of Present Illness:    Alexander Stuck. is a 54 y.o. male with a hx of diabetes, nonischemic cardiomyopathy, EF 30 to 35%, hyperlipidemia, hypertension who presents for follow-up.    Patient is being seen due to nonischemic cardiomyopathy, CHF medications being optimized.  Coreg was increased to 12.5 mg twice daily after last visit.  Currently on Coreg, losartan 100, spironolactone 25 mg daily, Lasix 20 mg daily.  He states feeling well, denies chest pain or shortness of breath.  Tolerating medications okay without any side effects.   Past Medical History:  Diagnosis Date  . Diabetes mellitus without complication ([place])   . HFrEF (heart failure with reduced ejection fraction) ([place]) 03/2020   Echo 03/2020 LVEF 30-35%  . Hyperlipidemia   . Hypertension     Past Surgical History:  Procedure Laterality Date  . I & D EXTREMITY Left 11/24/2017   Procedure: IRRIGATION AND DEBRIDEMENT LEFT THIGH ABSCESS;  Surgeon: Erroll Luna, MD;  Location: [place];  Service: General;  Laterality: Left;  . ORIF HIP FRACTURE Right   . RIGHT/LEFT HEART CATH AND CORONARY ANGIOGRAPHY N/A 04/30/2020   Procedure: RIGHT/LEFT HEART CATH AND CORONARY ANGIOGRAPHY;  Surgeon: Wellington Hampshire, MD;  Location: [place] [facility];  Service: Cardiovascular;  Laterality: N/A;    Current Medications: Current Meds  Medication Sig  . albuterol (VENTOLIN HFA) 108 (90 Base) MCG/ACT inhaler Inhale 2 puffs into the lungs every 4 (four) hours as needed for wheezing or shortness of breath.   Marland Kitchen aspirin 81 MG tablet Take 1 tablet (81 mg total) by mouth every other day.   . carvedilol (COREG) 12.5 MG tablet Take 1 tablet (12.5 mg total) by mouth 2 (two) times daily.  . clotrimazole (LOTRIMIN) 1 % cream APPLY TO AFFECTED AREA TWICE A DAY *NOT COVERED* (Patient taking differently: Apply 1 application topically 2 (two) times daily. )  . furosemide (LASIX) 20 MG tablet Take 1 tablet (20 mg total) by mouth daily.  Marland Kitchen glipiZIDE (GLUCOTROL) 10 MG tablet TAKE 2 TABLETS BY MOUTH 2  TIMES DAILY BEFORE MEALS (Patient taking differently: Take 20 mg by mouth 2 (two) times daily before a meal. TAKE 2 TABLETS BY MOUTH 2  TIMES DAILY BEFORE MEALS)  . insulin glargine (LANTUS SOLOSTAR) 100 UNIT/ML Solostar Pen Inject 20 Units into the skin at bedtime.  . Insulin Pen Needle 32G X 4 MM MISC Use 1x a day  . losartan (COZAAR) 100 MG tablet Take 1 tablet (100 mg total) by mouth daily.  . metFORMIN (GLUCOPHAGE) 1000 MG tablet TAKE 1 TABLET BY MOUTH TWICE A DAY WITH MEALS  . Multiple Vitamin (MULTIVITAMIN) tablet Take 1 tablet by mouth daily.  Marland Kitchen OZEMPIC, 1 MG/DOSE, 2 MG/1.5ML SOPN INJECT 1 MG INTO THE SKIN  WEEKLY  . sildenafil (VIAGRA) 100 MG tablet TAKE 1 TABLET BY MOUTH AS  NEEDED FOR ERECTILE  DYSFUNCTION AS DIRECTED  . simvastatin (ZOCOR) 40 MG tablet Take 1 tablet (40 mg total) by mouth daily.  Marland Kitchen spironolactone (ALDACTONE) 25 MG tablet Take 1 tablet (25  mg total) by mouth daily.  . [DISCONTINUED] carvedilol (COREG) 12.5 MG tablet Take 1 tablet (12.5 mg total) by mouth 2 (two) times daily.  . [DISCONTINUED] furosemide (LASIX) 20 MG tablet Take 1 tablet (20 mg total) by mouth daily.  . [DISCONTINUED] losartan (COZAAR) 100 MG tablet TAKE 1 TABLET BY MOUTH  DAILY  . [DISCONTINUED] simvastatin (ZOCOR) 40 MG tablet TAKE 1 TABLET BY MOUTH  DAILY  . [DISCONTINUED] spironolactone (ALDACTONE) 25 MG tablet Take 1 tablet (25 mg total) by mouth daily.     Allergies:   Patient has no known allergies.   Social History   Socioeconomic History  . Marital status: Married    Spouse name:  Thayer Headings  . Number of children: 1  . Years of education: college  . Highest education level: Not on file  Occupational History  . Occupation: Self employed  Tobacco Use  . Smoking status: Never Smoker  . Smokeless tobacco: Never Used  Substance and Sexual Activity  . Alcohol use: Yes    Alcohol/week: 2.0 standard drinks    Types: 2 Standard drinks or equivalent per week    Comment: social   . Drug use: No  . Sexual activity: Yes    Partners: Female  Other Topics Concern  . Not on file  Social History Narrative   Lives with wife.     Social Determinants of Health   Financial Resource Strain:   . Difficulty of Paying Living Expenses:   Food Insecurity:   . Worried About Charity fundraiser in the Last Year:   . Arboriculturist in the Last Year:   Transportation Needs:   . Film/video editor (Medical):   Marland Kitchen Lack of Transportation (Non-Medical):   Physical Activity:   . Days of Exercise per Week:   . Minutes of Exercise per Session:   Stress:   . Feeling of Stress :   Social Connections:   . Frequency of Communication with Friends and Family:   . Frequency of Social Gatherings with Friends and Family:   . Attends Religious Services:   . Active Member of Clubs or Organizations:   . Attends Archivist Meetings:   Marland Kitchen Marital Status:      Family History: The patient's family history includes Diabetes in his father; Heart attack (age of onset: 58) in his father.  ROS:   Please see the history of present illness.     All other systems reviewed and are negative.  EKGs/Labs/Other Studies Reviewed:    The following studies were reviewed today:  EKG:  EKG is  ordered today.  The ekg ordered today demonstrates normal sinus rhythm.  Recent Labs: 03/19/2020: ALT 13; TSH 1.550 04/09/2020: BUN 20; Creatinine, Ser 0.86; Hemoglobin 13.9; Platelets 246; Potassium 4.1; Sodium 139  Recent Lipid Panel    Component Value Date/Time   CHOL 135 03/19/2020 0856   TRIG 126  03/19/2020 0856   HDL 66 03/19/2020 0856   CHOLHDL 2.0 03/19/2020 0856   CHOLHDL 2.5 10/21/2016 1223   VLDL 31 (H) 10/21/2016 1223   LDLCALC 47 03/19/2020 0856    Physical Exam:    VS:  BP 140/90 (BP Location: Left Arm, Patient Position: Sitting, Cuff Size: Normal)   Pulse 86   Ht 5\' 9"  (1.753 m)   Wt 233 lb 4 oz (105.8 kg)   SpO2 98%   BMI 34.45 kg/m     Wt Readings from Last 3 Encounters:  06/18/20 233 lb 4  oz (105.8 kg)  06/18/20 234 lb 3.2 oz (106.2 kg)  05/14/20 231 lb (104.8 kg)     GEN:  Well nourished, well developed in no acute distress HEENT: Normal NECK: No JVD; No carotid bruits LYMPHATICS: No lymphadenopathy CARDIAC: RRR, no murmurs, rubs, gallops RESPIRATORY:  Clear to auscultation without rales, wheezing or rhonchi  ABDOMEN: Soft, non-tender, non-distended MUSCULOSKELETAL:  No edema; No deformity  SKIN: Warm and dry NEUROLOGIC:  Alert and oriented x 3 PSYCHIATRIC:  Normal affect   ASSESSMENT:    1. NICM (nonischemic cardiomyopathy) ([place])   2. Essential hypertension   3. Pure hypercholesterolemia    PLAN:    In order of problems listed above:  1. Patient with nonischemic cardiomyopathy, last EF 30 to 35%.  Patient describes NYHA class II symptoms.  Patient is euvolemic.  cont Coreg to 12.5 mg twice daily, continue losartan 100 mg, spironolactone 25 mg, Lasix 20 mg daily.  We will continue current meds and plan for repeat echocardiogram in roughly 2 months. 2. History of hypertension, systolic blood pressure elevated.  Usually normal.  Blood pressure at home today was 384 systolic.  Continue current BP therapy as above. 3. History of hyperlipidemia, LDL at goal, continue statin.  Follow-up in 2-3 months after echo  Total encounter time 42 minutes  Greater than 50% was spent in counseling and coordination of care with the patient Time spent on but not limited to counseling patient on medication adherence, reason for repeat echocardiogram, timing for  repeat echocardiogram, possible outcomes.  This note was generated in part or whole with voice recognition software. Voice recognition is usually quite accurate but there are transcription errors that can and very often do occur. I apologize for any typographical errors that were not detected and corrected.  Medication Adjustments/Labs and Tests Ordered: Current medicines are reviewed at length with the patient today.  Concerns regarding medicines are outlined above.  Orders Placed This Encounter  Procedures  . EKG 12-Lead  . ECHOCARDIOGRAM COMPLETE   Meds ordered this encounter  Medications  . carvedilol (COREG) 12.5 MG tablet    Sig: Take 1 tablet (12.5 mg total) by mouth 2 (two) times daily.    Dispense:  180 tablet    Refill:  2  . furosemide (LASIX) 20 MG tablet    Sig: Take 1 tablet (20 mg total) by mouth daily.    Dispense:  90 tablet    Refill:  2  . losartan (COZAAR) 100 MG tablet    Sig: Take 1 tablet (100 mg total) by mouth daily.    Dispense:  90 tablet    Refill:  2    Requesting 1 year supply  . simvastatin (ZOCOR) 40 MG tablet    Sig: Take 1 tablet (40 mg total) by mouth daily.    Dispense:  90 tablet    Refill:  2    Requesting 1 year supply  . spironolactone (ALDACTONE) 25 MG tablet    Sig: Take 1 tablet (25 mg total) by mouth daily.    Dispense:  90 tablet    Refill:  2    Patient Instructions  Medication Instructions:   Your physician recommends that you continue on your current medications as directed. Please refer to the Current Medication list given to you today.  *If you need a refill on your cardiac medications before your next appointment, please call your pharmacy*   Lab Work: None Ordered If you have labs (blood work) drawn today and your  tests are completely normal, you will receive your results only by: Marland Kitchen MyChart Message (if you have MyChart) OR . A paper copy in the mail If you have any lab test that is abnormal or we need to change your  treatment, we will call you to review the results.   Testing/Procedures:  Your physician has requested that you have an echocardiogram in 2 months . Echocardiography is a painless test that uses sound waves to create images of your heart. It provides your doctor with information about the size and shape of your heart and how well your heart's chambers and valves are working. This procedure takes approximately one hour. There are no restrictions for this procedure.   Follow-Up: At [facility], you and your health needs are our priority.  As part of our continuing mission to provide you with exceptional heart care, we have created designated Provider Care Teams.  These Care Teams include your primary Cardiologist (physician) and Advanced Practice Providers (APPs -  Physician Assistants and Nurse Practitioners) who all work together to provide you with the care you need, when you need it.  We recommend signing up for the patient portal called "MyChart".  Sign up information is provided on this After Visit Summary.  MyChart is used to connect with patients for Virtual Visits (Telemedicine).  Patients are able to view lab/test results, encounter notes, upcoming appointments, etc.  Non-urgent messages can be sent to your provider as well.   To learn more about what you can do with MyChart, go to [email or web address].    Your next appointment:   After follow up echo in 2 months   The format for your next appointment:   In Person  Provider:   Kate Sable, MD   Other Instructions  N/A     Signed, Kate Sable, MD  06/18/2020 1:13 PM    [place]

## 2020-06-18 NOTE — Progress Notes (Signed)
6/28/20219:01 AM  Alexander [name]. 09-12-1966, 54 y.o., male [id number]  Chief Complaint  Patient presents with  . Diabetes    pt has been about 120-150 on average when he takes BG at home, pt has tried to eliminate sugars in their diet to help.   Marland Kitchen Referral    pt needs referral to have a colonoscopy.     HPI:   Patient is a 54 y.o. male with past medical history significant for HTN, CHF, DM2, HLP who presents today for routine followup  Last OV March 2021 Last endo visit may 2021 - Dr Cruzita Lederer, stopped actos, on lantus per note ok by DOT Last cards visit may 2021 - heart cath LVEF 30%, no CAD, mod pHTN, increased coreg, repeat echo in 3 months, sees him later today  Patient reports he has had 2 colonoscopy, last one at age 35 He denies any h/o polyps or fhx colon cancer Last colonoscopy ordered by Dr Deland Pretty, previous PCP  Lab Results  Component Value Date   CREATININE 0.86 04/09/2020   BUN 20 04/09/2020   NA 139 04/09/2020   K 4.1 04/09/2020   CL 103 04/09/2020   CO2 20 04/09/2020   Lab Results  Component Value Date   CHOL 135 03/19/2020   HDL 66 03/19/2020   LDLCALC 47 03/19/2020   TRIG 126 03/19/2020   CHOLHDL 2.0 03/19/2020    Depression screen PHQ 2/9 06/18/2020 03/19/2020 09/05/2019  Decreased Interest 0 0 0  Down, Depressed, Hopeless 0 0 0  PHQ - 2 Score 0 0 0    Fall Risk  06/18/2020 03/19/2020 09/05/2019 05/30/2019 05/28/2018  Falls in the past year? 0 0 0 0 No  Number falls in past yr: - 0 0 0 -  Injury with Fall? - 0 0 0 -  Follow up Falls evaluation completed - - - -     No Known Allergies  Prior to Admission medications   Medication Sig Start Date End Date Taking? Authorizing Provider  albuterol (VENTOLIN HFA) 108 (90 Base) MCG/ACT inhaler Inhale 2 puffs into the lungs every 4 (four) hours as needed for wheezing or shortness of breath.  01/15/20  Yes [provider]  aspirin 81 MG tablet Take 1 tablet (81 mg total) by mouth every other  day. 05/28/18  Yes McVey, Gelene Mink, PA-C  carvedilol (COREG) 12.5 MG tablet Take 1 tablet (12.5 mg total) by mouth 2 (two) times daily. 05/14/20 08/12/20 Yes Agbor-Etang, Aaron Edelman, MD  clotrimazole (LOTRIMIN) 1 % cream APPLY TO AFFECTED AREA TWICE A DAY *NOT COVERED* Patient taking differently: Apply 1 application topically 2 (two) times daily.  07/05/18  Yes McVey, Gelene Mink, PA-C  furosemide (LASIX) 20 MG tablet Take 1 tablet (20 mg total) by mouth daily. 05/14/20  Yes Agbor-Etang, Aaron Edelman, MD  glipiZIDE (GLUCOTROL) 10 MG tablet TAKE 2 TABLETS BY MOUTH 2  TIMES DAILY BEFORE MEALS Patient taking differently: Take 20 mg by mouth 2 (two) times daily before a meal. TAKE 2 TABLETS BY MOUTH 2  TIMES DAILY BEFORE MEALS 09/27/19  Yes Rutherford Guys, MD  insulin glargine (LANTUS SOLOSTAR) 100 UNIT/ML Solostar Pen Inject 20 Units into the skin at bedtime. 05/07/20  Yes Philemon Kingdom, MD  Insulin Pen Needle 32G X 4 MM MISC Use 1x a day 02/06/20  Yes Philemon Kingdom, MD  losartan (COZAAR) 100 MG tablet TAKE 1 TABLET BY MOUTH  DAILY 10/10/19  Yes McVey, Gelene Mink, PA-C  metFORMIN (GLUCOPHAGE) 1000 MG  tablet TAKE 1 TABLET BY MOUTH TWICE A DAY WITH MEALS 06/11/20  Yes Rutherford Guys, MD  Multiple Vitamin (MULTIVITAMIN) tablet Take 1 tablet by mouth daily.   Yes [provider]  OZEMPIC, 1 MG/DOSE, 2 MG/1.5ML SOPN INJECT 1 MG INTO THE SKIN  WEEKLY 06/11/20  Yes Philemon Kingdom, MD  sildenafil (VIAGRA) 100 MG tablet TAKE 1 TABLET BY MOUTH AS  NEEDED FOR ERECTILE  DYSFUNCTION AS DIRECTED 10/10/19  Yes Rutherford Guys, MD  simvastatin (ZOCOR) 40 MG tablet TAKE 1 TABLET BY MOUTH  DAILY 09/14/19  Yes Rutherford Guys, MD  spironolactone (ALDACTONE) 25 MG tablet Take 1 tablet (25 mg total) by mouth daily. 04/23/20  Yes Wendall Mola, NP  sildenafil (VIAGRA) 100 MG tablet TAKE 1 TABLET BY MOUTH AS  NEEDED FOR ERECTILE  DYSFUNCTION AS DIRECTED 06/20/19   Maximiano Coss, NP    Past  Medical History:  Diagnosis Date  . Diabetes mellitus without complication ([place])   . HFrEF (heart failure with reduced ejection fraction) ([place]) 03/2020   Echo 03/2020 LVEF 30-35%  . Hyperlipidemia   . Hypertension     Past Surgical History:  Procedure Laterality Date  . I & D EXTREMITY Left 11/24/2017   Procedure: IRRIGATION AND DEBRIDEMENT LEFT THIGH ABSCESS;  Surgeon: Erroll Luna, MD;  Location: [place];  Service: General;  Laterality: Left;  . ORIF HIP FRACTURE Right   . RIGHT/LEFT HEART CATH AND CORONARY ANGIOGRAPHY N/A 04/30/2020   Procedure: RIGHT/LEFT HEART CATH AND CORONARY ANGIOGRAPHY;  Surgeon: Wellington Hampshire, MD;  Location: [place] [facility];  Service: Cardiovascular;  Laterality: N/A;    Social History   Tobacco Use  . Smoking status: Never Smoker  . Smokeless tobacco: Never Used  Substance Use Topics  . Alcohol use: Yes    Alcohol/week: 2.0 standard drinks    Types: 2 Standard drinks or equivalent per week    Comment: social     Family History  Problem Relation Age of Onset  . Diabetes Father   . Heart attack Father 49       Died age 66 MI    Review of Systems  Constitutional: Negative for chills and fever.  Respiratory: Negative for cough and shortness of breath.   Cardiovascular: Negative for chest pain, palpitations and leg swelling.  Gastrointestinal: Negative for abdominal pain, nausea and vomiting.     OBJECTIVE:  Today's Vitals   06/18/20 0830  BP: 115/80  Pulse: 89  Resp: 15  Temp: 97.8 F (36.6 C)  TempSrc: Temporal  SpO2: 95%  Weight: 234 lb 3.2 oz (106.2 kg)  Height: 5\' 9"  (1.753 m)   Body mass index is 34.59 kg/m.  Wt Readings from Last 3 Encounters:  06/18/20 234 lb 3.2 oz (106.2 kg)  05/14/20 231 lb (104.8 kg)  05/07/20 235 lb (106.6 kg)    Physical Exam Vitals and nursing note reviewed.  Constitutional:      Appearance: He is well-developed.  HENT:     Head: Normocephalic and atraumatic.  Eyes:      Conjunctiva/sclera: Conjunctivae normal.     Pupils: Pupils are equal, round, and reactive to light.  Cardiovascular:     Rate and Rhythm: Normal rate and regular rhythm.     Heart sounds: No murmur heard.  No friction rub. No gallop.   Pulmonary:     Effort: Pulmonary effort is normal.     Breath sounds: Normal breath sounds. No wheezing or rales.  Musculoskeletal:  Cervical back: Neck supple.     Right lower leg: No edema.     Left lower leg: No edema.  Skin:    General: Skin is warm and dry.  Neurological:     Mental Status: He is alert and oriented to person, place, and time.      Diabetic Foot Form - Detailed   Diabetic Foot Exam - detailed Visual Foot Exam completed.: Yes  Pulse Foot Exam completed.: Yes  Sensory Foot Exam Completed.: Yes Semmes-Weinstein Monofilament Test   Comments: Pt had some dry skin with some minimal cracking      No results found for this or any previous visit (from the past 24 hour(s)).  No results found.   ASSESSMENT and PLAN  1. Pure hypercholesterolemia Controlled. Continue current regime.   2. Nonischemic cardiomyopathy ([place]) Managed by cards  3. Essential hypertension Controlled. Continue current regime.   4. Uncontrolled type 2 diabetes mellitus with peripheral neuropathy ([place]) Managed by endo - [facility] DIABETES FOOT EXAM  5. Colon cancer screening Per patient UTD, will request records  Return in about 6 months (around 12/18/2020).    Rutherford Guys, MD Primary [facility] at [place],  [id number] Ph.  [phone] Fax [phone]

## 2020-06-22 NOTE — Progress Notes (Signed)
Internal hemorrhoids, otherwise unremarkable, follow up in 5 yrs

## 2020-07-10 ENCOUNTER — Encounter: Payer: Self-pay | Admitting: Family Medicine

## 2020-08-01 ENCOUNTER — Other Ambulatory Visit: Payer: Self-pay | Admitting: Family Medicine

## 2020-08-01 DIAGNOSIS — N481 Balanitis: Secondary | ICD-10-CM

## 2020-08-01 NOTE — Telephone Encounter (Signed)
Medication Refill - Medication: Lotrimin Cream  Has the patient contacted their pharmacy? No. (Agent: If no, request that the patient contact the pharmacy for the refill.) (Agent: If yes, when and what did the pharmacy advise?)  Preferred Pharmacy (with phone number or street name): [facility] [address]  Agent: Please be advised that RX refills may take up to 3 business days. We ask that you follow-up with your pharmacy.

## 2020-08-01 NOTE — Telephone Encounter (Signed)
Requested medication (s) are due for refill today: no  Requested medication (s) are on the active medication list:yes   Future visit scheduled: yes  Notes to clinic:  Medication not assigned to a protocol, review manually   Requested Prescriptions  Pending Prescriptions Disp Refills   clotrimazole (LOTRIMIN) 1 % cream 30 g 0      Off-Protocol Failed - 08/01/2020 10:23 AM      Failed - Medication not assigned to a protocol, review manually.      Passed - Valid encounter within last 12 months    Recent Outpatient Visits           1 month ago Pure hypercholesterolemia   Primary Care at [facility], Lilia Argue, MD   4 months ago Type 2 diabetes mellitus with hyperglycemia, without long-term current use of insulin [facility])   [facility] at [facility], Lilia Argue, MD   11 months ago Type 2 diabetes mellitus without complication, without long-term current use of insulin [facility])   [facility] at [facility], Lilia Argue, MD   1 year ago Annual physical exam   [facility] at [facility], Lilia Argue, MD   2 years ago Annual physical exam   Primary Care at [facility], Gelene Mink, PA-C       Future Appointments             In 1 month Agbor-Etang, Aaron Edelman, MD [facility], [facility]   In 4 months Pamella Pert, Lilia Argue, MD Primary Care at [place], [facility]

## 2020-08-02 MED ORDER — CLOTRIMAZOLE 1 % EX CREA: 1.0000 "application " | TOPICAL_CREAM | Freq: Two times a day (BID) | CUTANEOUS | 0 refills | Status: AC

## 2020-08-07 ENCOUNTER — Telehealth: Payer: Self-pay | Admitting: Family Medicine

## 2020-08-07 NOTE — Telephone Encounter (Signed)
Pt is caLliing NEEDS REFILL ON  What is the name of the medication? clotrimazole (LOTRIMIN) 1 % cream    Have you contacted your pharmacy to request a refill? y  Which pharmacy would you like this sent to? [facility] [place], [place]  [address] [place], [place] [address]  Phone:  [phone] Fax:  [phone]  DEA #:  [id number]    Patient notified that their request is being sent to the clinical staff for review and that they should receive a call once it is complete. If they do not receive a call within 72 hours they can check with their pharmacy or our office.

## 2020-08-09 ENCOUNTER — Other Ambulatory Visit: Payer: Self-pay | Admitting: Family Medicine

## 2020-08-09 DIAGNOSIS — E785 Hyperlipidemia, unspecified: Secondary | ICD-10-CM

## 2020-08-09 DIAGNOSIS — E119 Type 2 diabetes mellitus without complications: Secondary | ICD-10-CM

## 2020-08-09 NOTE — Telephone Encounter (Signed)
Requested Prescriptions  Pending Prescriptions Disp Refills  . glipiZIDE (GLUCOTROL) 10 MG tablet [Pharmacy Med Name: GLIPIZIDE  10MG  TAB] 360 tablet 1    Sig: TAKE 2 TABLETS BY MOUTH  TWICE DAILY BEFORE MEALS     Endocrinology:  Diabetes - Sulfonylureas Failed - 08/09/2020  3:46 PM      Failed - HBA1C is between 0 and 7.9 and within 180 days    Hemoglobin A1C  Date Value Ref Range Status  05/07/2020 10.2 (A) 4.0 - 5.6 % Final   Hgb A1c MFr Bld  Date Value Ref Range Status  05/30/2019 12.1 (H) 4.8 - 5.6 % Final    Comment:             Prediabetes: 5.7 - 6.4          Diabetes: >6.4          Glycemic control for adults with diabetes: <7.0          Passed - Valid encounter within last 6 months    Recent Outpatient Visits          1 month ago Pure hypercholesterolemia   [facility] at [facility], Lilia Argue, MD   4 months ago Type 2 diabetes mellitus with hyperglycemia, without long-term current use of insulin [facility])   [facility] at [facility], Lilia Argue, MD   11 months ago Type 2 diabetes mellitus without complication, without long-term current use of insulin ([place])   [facility] at [facility], Lilia Argue, MD   1 year ago Annual physical exam   [facility] at [facility], Lilia Argue, MD   2 years ago Annual physical exam   Primary Care at [facility], Gelene Mink, PA-C      Future Appointments            In 1 month Agbor-Etang, Aaron Edelman, MD [facility], [facility]   In 4 months [place], Lilia Argue, MD Primary Care at [place], [facility]           . metFORMIN (GLUCOPHAGE) 1000 MG tablet [Pharmacy Med Name: metFORMIN HCl 1000 MG Oral Tablet] 180 tablet 1    Sig: TAKE 1 TABLET BY MOUTH  TWICE DAILY WITH MEALS     Endocrinology:  Diabetes - Biguanides Failed - 08/09/2020  3:46 PM      Failed - HBA1C is between 0 and 7.9 and within 180 days    Hemoglobin A1C  Date Value Ref Range Status  05/07/2020 10.2 (A) 4.0 - 5.6 % Final   Hgb A1c MFr Bld  Date  Value Ref Range Status  05/30/2019 12.1 (H) 4.8 - 5.6 % Final    Comment:             Prediabetes: 5.7 - 6.4          Diabetes: >6.4          Glycemic control for adults with diabetes: <7.0          Passed - Cr in normal range and within 360 days    Creat  Date Value Ref Range Status  10/21/2016 0.79 0.70 - 1.33 mg/dL Final    Comment:      For patients > or = 55 years of age: The upper reference limit for Creatinine is approximately 13% higher for people identified as African-American.      Creatinine, Ser  Date Value Ref Range Status  04/09/2020 0.86 0.76 - 1.27 mg/dL Final   Creatinine, Urine  Date  Value Ref Range Status  07/22/2015 46.8 mg/dL Final    Comment:    No reference range established.         Passed - eGFR in normal range and within 360 days    GFR, Est African American  Date Value Ref Range Status  10/21/2016 >89 >=60 mL/min Final   GFR calc Af Amer  Date Value Ref Range Status  04/09/2020 113 >59 mL/min/1.73 Final   GFR, Est Non African American  Date Value Ref Range Status  10/21/2016 >89 >=60 mL/min Final   GFR calc non Af Amer  Date Value Ref Range Status  04/09/2020 98 >59 mL/min/1.73 Final         Passed - Valid encounter within last 6 months    Recent Outpatient Visits          1 month ago Pure hypercholesterolemia   [facility] at [facility], Lilia Argue, MD   4 months ago Type 2 diabetes mellitus with hyperglycemia, without long-term current use of insulin [facility])   [facility] at [facility], Lilia Argue, MD   11 months ago Type 2 diabetes mellitus without complication, without long-term current use of insulin [facility])   [facility] at [facility], Lilia Argue, MD   1 year ago Annual physical exam   [facility] at [facility], Lilia Argue, MD   2 years ago Annual physical exam   Primary Care at [facility], Gelene Mink, PA-C      Future Appointments            In 1 month Agbor-Etang, Aaron Edelman, MD [facility],  [facility]   In 4 months Pamella Pert, Lilia Argue, MD Primary Care at [place], [facility]

## 2020-08-10 NOTE — Telephone Encounter (Signed)
I have called the pt and informed him that I have spoken to his pharmacist and that medication is not covered through his insurance because it is not able to be purchased OTC. Pt sated understanding.

## 2020-08-10 NOTE — Telephone Encounter (Signed)
Patient calling back about this request. Patient has not received medication from [facility]

## 2020-09-01 ENCOUNTER — Other Ambulatory Visit: Payer: Self-pay | Admitting: Family Medicine

## 2020-09-01 DIAGNOSIS — E785 Hyperlipidemia, unspecified: Secondary | ICD-10-CM

## 2020-09-01 DIAGNOSIS — E119 Type 2 diabetes mellitus without complications: Secondary | ICD-10-CM

## 2020-09-01 NOTE — Telephone Encounter (Signed)
Change of pharmacy Requested Prescriptions  Pending Prescriptions Disp Refills  . metFORMIN (GLUCOPHAGE) 1000 MG tablet [Pharmacy Med Name: METFORMIN HCL 1,000 MG TABLET] 180 tablet 0    Sig: TAKE 1 TABLET BY MOUTH TWICE A DAY WITH MEALS     Endocrinology:  Diabetes - Biguanides Failed - 09/01/2020  1:30 PM      Failed - HBA1C is between 0 and 7.9 and within 180 days    Hemoglobin A1C  Date Value Ref Range Status  05/07/2020 10.2 (A) 4.0 - 5.6 % Final   Hgb A1c MFr Bld  Date Value Ref Range Status  05/30/2019 12.1 (H) 4.8 - 5.6 % Final    Comment:             Prediabetes: 5.7 - 6.4          Diabetes: >6.4          Glycemic control for adults with diabetes: <7.0          Passed - Cr in normal range and within 360 days    Creat  Date Value Ref Range Status  10/21/2016 0.79 0.70 - 1.33 mg/dL Final    Comment:      For patients > or = 55 years of age: The upper reference limit for Creatinine is approximately 13% higher for people identified as African-American.      Creatinine, Ser  Date Value Ref Range Status  04/09/2020 0.86 0.76 - 1.27 mg/dL Final   Creatinine, Urine  Date Value Ref Range Status  07/22/2015 46.8 mg/dL Final    Comment:    No reference range established.         Passed - eGFR in normal range and within 360 days    GFR, Est African American  Date Value Ref Range Status  10/21/2016 >89 >=60 mL/min Final   GFR calc Af Amer  Date Value Ref Range Status  04/09/2020 113 >59 mL/min/1.73 Final   GFR, Est Non African American  Date Value Ref Range Status  10/21/2016 >89 >=60 mL/min Final   GFR calc non Af Amer  Date Value Ref Range Status  04/09/2020 98 >59 mL/min/1.73 Final         Passed - Valid encounter within last 6 months    Recent Outpatient Visits          2 months ago Pure hypercholesterolemia   [facility] at [facility], Lilia Argue, MD   5 months ago Type 2 diabetes mellitus with hyperglycemia, without long-term current use of  insulin [facility])   [facility] at [facility], Lilia Argue, MD   12 months ago Type 2 diabetes mellitus without complication, without long-term current use of insulin [facility])   [facility] at [facility], Lilia Argue, MD   1 year ago Annual physical exam   [facility] at [facility], Lilia Argue, MD   2 years ago Annual physical exam   Primary Care at [facility], Gelene Mink, PA-C      Future Appointments            In 3 weeks Agbor-Etang, Aaron Edelman, MD [facility], [facility]   In 3 months Pamella Pert, Lilia Argue, MD Primary Care at [place], [facility]

## 2020-09-03 ENCOUNTER — Other Ambulatory Visit: Payer: 59

## 2020-09-03 ENCOUNTER — Ambulatory Visit: Payer: 59 | Admitting: Internal Medicine

## 2020-09-03 NOTE — Progress Notes (Deleted)
Patient ID: Alexander [name]., male   DOB: 11/19/1966, 54 y.o.   MRN: [id number]   This visit occurred during the SARS-CoV-2 public health emergency.  Safety protocols were in place, including screening questions prior to the visit, additional usage of staff PPE, and extensive cleaning of exam room while observing appropriate contact time as indicated for disinfecting solutions.   HPI: Alexander [name]. is a 54 y.o.-year-old male, initially referred by his PCP,  Rutherford Guys, MD,  returning for follow-up for DM2, dx in 2005, insulin-dependent, uncontrolled, with complications (CHF, PN, ED - saw Dr. Loanne Drilling for this in 2014).  Last visit 4 months ago.  Reviewed HbA1c levels: Lab Results  Component Value Date   HGBA1C 10.2 (A) 05/07/2020   HGBA1C 10.8 (A) 02/06/2020   HGBA1C 9.7 (A) 11/04/2019   HGBA1C 12.1 (H) 05/30/2019   HGBA1C 11.9 (A) 08/16/2018   HGBA1C 14.0 (A) 05/28/2018   HGBA1C 12.1 (H) 11/23/2017   HGBA1C 11.9 10/30/2016   HGBA1C 11.2 (H) 10/21/2016   HGBA1C 11.4 03/08/2016   Pt is on a regimen of: - Metformin 1000 mg 2x a day, with meals - Glipizide 10 >> 20 mg 2x a day before meals - Ozempic 1 mg weekly - - recommended 05/2019 - discussed with DOT >> approved, but he did not start it... 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 Actos 50 mg daily was stopped by cardiology.  He used the CGM before but ran out of sensors.  Pt checks his sugars 1-2 times a day: - am: 130, 150-175, 200 >> 100-180 >> 115-180 - 2h after b'fast: n/c - before lunch: n/c - 2h after lunch: n/c - before dinner: n/c - 2h after dinner: 200-225 >> 180-200s >> 120-130 - bedtime: n/c - nighttime: n/c Lowest sugar was 100 >> 115; it is unclear at which CBG level he has hypoglycemia awareness. Highest sugar was 300...>> 230 (sweets at night).  Glucometer: ReliOn  Pt's meals are: - Breakfast:  2 eggs, 2 bacon pieces, OJ - Lunch: fast food, grilled fish + green beans, cabbage, corn, no bread, unsweet tea - Dinner: Fish or chicken + veggies - Snacks: fruit, nuts  -No CKD, last BUN/creatinine:  Lab Results  Component Value Date   BUN 20 04/09/2020   BUN 16 03/19/2020   CREATININE 0.86 04/09/2020   CREATININE 0.85 03/19/2020  On Cozaar 100.  -+ HL; last set of lipids: Lab Results  Component Value Date   CHOL 135 03/19/2020   HDL 66 03/19/2020   LDLCALC 47 03/19/2020   TRIG 126 03/19/2020   CHOLHDL 2.0 03/19/2020  On Zocor 40.  - last eye exam was in 01/2020: No DR  - no numbness and tingling in his feet.  He is on ASA 81.  He was admitted for heart catheterization on 04/30/2020.  No significant blockages were found.  Actos was stopped at that time. Reviewed the report of his catheterization:  There is moderate to severe left ventricular systolic dysfunction.  LV end diastolic pressure is moderately elevated.  The left ventricular ejection fraction is 25-35% by visual estimate.   1.  Minimal irregularities with no evidence of obstructive coronary artery disease. 2.  Moderately to severely reduced LV systolic function with an ejection fraction of 30%. 3.  Right heart catheterization showed mildly elevated left-sided filling pressures with wedge  pressure of 19 mmHg, moderate pulmonary hypertension at 50/23 with a mean of 35 mmHg and normal cardiac output at 6.79 with a cardiac index of 3.11.  Pulmonary vascular resistance of 2.36 [place] unit.  Recommendations: The patient has nonischemic cardiomyopathy for which I recommend continuing medical therapy. He is mildly volume overloaded which could be in part related to the preprocedure hydration.   ROS: Constitutional: no weight gain/no weight loss, no fatigue, no subjective hyperthermia, no subjective hypothermia Eyes: no blurry vision, no xerophthalmia ENT: no sore throat, no nodules palpated in neck, no dysphagia,  no odynophagia, no hoarseness Cardiovascular: no CP/no SOB/no palpitations/no leg swelling Respiratory: no cough/no SOB/no wheezing Gastrointestinal: no N/no V/no D/no C/no acid reflux Musculoskeletal: no muscle aches/no joint aches Skin: no rashes, no hair loss Neurological: no tremors/no numbness/no tingling/no dizziness  I reviewed pt's medications, allergies, PMH, social hx, family hx, and changes were documented in the history of present illness. Otherwise, unchanged from my initial visit note.  Past Medical History:  Diagnosis Date  . Diabetes mellitus without complication ([place])   . HFrEF (heart failure with reduced ejection fraction) ([place]) 03/2020   Echo 03/2020 LVEF 30-35%  . Hyperlipidemia   . Hypertension    Past Surgical History:  Procedure Laterality Date  . I & D EXTREMITY Left 11/24/2017   Procedure: IRRIGATION AND DEBRIDEMENT LEFT THIGH ABSCESS;  Surgeon: Erroll Luna, MD;  Location: [place];  Service: General;  Laterality: Left;  . ORIF HIP FRACTURE Right   . RIGHT/LEFT HEART CATH AND CORONARY ANGIOGRAPHY N/A 04/30/2020   Procedure: RIGHT/LEFT HEART CATH AND CORONARY ANGIOGRAPHY;  Surgeon: Wellington Hampshire, MD;  Location: [place] CV LAB;  Service: Cardiovascular;  Laterality: N/A;   Social History   Socioeconomic History  . Marital status: Married    Spouse name: Thayer Headings  . Number of children: 1  . Years of education: college  . Highest education level: Not on file  Occupational History  . Occupation: Self employed  Tobacco Use  . Smoking status: Never Smoker  . Smokeless tobacco: Never Used  Substance and Sexual Activity  . Alcohol use: Yes    Alcohol/week: 2.0 standard drinks    Types: 2 Standard drinks or equivalent per week    Comment: social   . Drug use: No  . Sexual activity: Yes    Partners: Female  Other Topics Concern  . Not on file  Social History Narrative   Lives with wife.     Social Determinants of Health   Financial Resource  Strain:   . Difficulty of Paying Living Expenses: Not on file  Food Insecurity:   . Worried About Charity fundraiser in the Last Year: Not on file  . Ran Out of Food in the Last Year: Not on file  Transportation Needs:   . Lack of Transportation (Medical): Not on file  . Lack of Transportation (Non-Medical): Not on file  Physical Activity:   . Days of Exercise per Week: Not on file  . Minutes of Exercise per Session: Not on file  Stress:   . Feeling of Stress : Not on file  Social Connections:   . Frequency of Communication with Friends and Family: Not on file  . Frequency of Social Gatherings with Friends and Family: Not on file  . Attends Religious Services: Not on file  . Active Member of Clubs or Organizations: Not on file  . Attends Archivist Meetings: Not on file  . Marital Status:  Not on file  Intimate Partner Violence:   . Fear of Current or Ex-Partner: Not on file  . Emotionally Abused: Not on file  . Physically Abused: Not on file  . Sexually Abused: Not on file   Current Outpatient Medications on File Prior to Visit  Medication Sig Dispense Refill  . albuterol (VENTOLIN HFA) 108 (90 Base) MCG/ACT inhaler Inhale 2 puffs into the lungs every 4 (four) hours as needed for wheezing or shortness of breath.     Marland Kitchen aspirin 81 MG tablet Take 1 tablet (81 mg total) by mouth every other day. 30 tablet 11  . carvedilol (COREG) 12.5 MG tablet Take 1 tablet (12.5 mg total) by mouth 2 (two) times daily. 180 tablet 2  . clotrimazole (LOTRIMIN) 1 % cream Apply 1 application topically 2 (two) times daily. 30 g 0  . furosemide (LASIX) 20 MG tablet Take 1 tablet (20 mg total) by mouth daily. 90 tablet 2  . glipiZIDE (GLUCOTROL) 10 MG tablet TAKE 2 TABLETS BY MOUTH  TWICE DAILY BEFORE MEALS 360 tablet 1  . insulin glargine (LANTUS SOLOSTAR) 100 UNIT/ML Solostar Pen Inject 20 Units into the skin at bedtime. 15 mL 4  . Insulin Pen Needle 32G X 4 MM MISC Use 1x a day 100 each 3  .  losartan (COZAAR) 100 MG tablet Take 1 tablet (100 mg total) by mouth daily. 90 tablet 2  . metFORMIN (GLUCOPHAGE) 1000 MG tablet TAKE 1 TABLET BY MOUTH TWICE A DAY WITH MEALS 180 tablet 0  . Multiple Vitamin (MULTIVITAMIN) tablet Take 1 tablet by mouth daily.    Marland Kitchen OZEMPIC, 1 MG/DOSE, 2 MG/1.5ML SOPN INJECT 1 MG INTO THE SKIN  WEEKLY 9 mL 1  . sildenafil (VIAGRA) 100 MG tablet TAKE 1 TABLET BY MOUTH AS  NEEDED FOR ERECTILE  DYSFUNCTION AS DIRECTED 3 tablet 0  . simvastatin (ZOCOR) 40 MG tablet Take 1 tablet (40 mg total) by mouth daily. 90 tablet 2  . spironolactone (ALDACTONE) 25 MG tablet Take 1 tablet (25 mg total) by mouth daily. 90 tablet 2  . [DISCONTINUED] sildenafil (VIAGRA) 100 MG tablet TAKE 1 TABLET BY MOUTH AS  NEEDED FOR ERECTILE  DYSFUNCTION AS DIRECTED 4 tablet 0   No current facility-administered medications on file prior to visit.   No Known Allergies Family History  Problem Relation Age of Onset  . Diabetes Father   . Heart attack Father 55       Died age 57 MI    PE: There were no vitals taken for this visit. Wt Readings from Last 3 Encounters:  06/18/20 233 lb 4 oz (105.8 kg)  06/18/20 234 lb 3.2 oz (106.2 kg)  05/14/20 231 lb (104.8 kg)   Constitutional: overweight, in NAD Eyes: PERRLA, EOMI, no exophthalmos ENT: moist mucous membranes, no thyromegaly, no cervical lymphadenopathy Cardiovascular: RRR, No MRG Respiratory: CTA B Gastrointestinal: abdomen soft, NT, ND, BS+ Musculoskeletal: no deformities, strength intact in all 4 Skin: moist, warm, no rashes Neurological: no tremor with outstretched hands, DTR normal in all 4  ASSESSMENT: 1. DM2, insulin-dependent, uncontrolled, with long-term complications - CHF - PN - ED  2. HL  3. Obesity class I  PLAN:  1. Patient with longstanding diabetes, on oral antidiabetic regimen with Metformin, sulfonylurea, and also weekly GLP-1 receptor agonist and daily long-acting insulin with still poor control.  HbA1c  at last visit was slightly better, but still very high, at 10.2%.  At that time, sugars were better after dinner  but they were still high in the morning.  We discussed about the need to stop eating after 8 PM.  However, we also increased his Lantus dose.  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 -At this visit, since he does have a history of CHF, will try to add an SGLT2 inhibitor  - I suggested to:  Patient Instructions  Please continue: - Metformin 1000 mg 2x a day, with meals - Glipizide 20 mg 2x a day before meals - Ozempic 1 mg weekly - Lantus 18 units daily   Please come back for a follow-up appointment in 3 months.  - we checked his HbA1c: 7%  - advised to check sugars at different times of the day - 1x a day, rotating check times - advised for yearly eye exams >> he is UTD - return to clinic in 3 months  2. HL -Reviewed latest lipid panel from 02/2020: All fractions at goal: Lab Results  Component Value Date   CHOL 135 03/19/2020   HDL 66 03/19/2020   LDLCALC 47 03/19/2020   TRIG 126 03/19/2020   CHOLHDL 2.0 03/19/2020  -Continues Zocor without side effects  3. Obesity class I -We will continue Ozempic and will also try to add an SGLT2 inhibitor, which should both help with weight loss -At last visit his weight was stable   Philemon Kingdom, MD PhD [facility] Endocrinology

## 2020-09-10 ENCOUNTER — Ambulatory Visit: Payer: 59 | Admitting: Cardiology

## 2020-09-21 ENCOUNTER — Other Ambulatory Visit: Payer: Self-pay

## 2020-09-21 ENCOUNTER — Ambulatory Visit (INDEPENDENT_AMBULATORY_CARE_PROVIDER_SITE_OTHER): Payer: 59

## 2020-09-21 DIAGNOSIS — I10 Essential (primary) hypertension: Secondary | ICD-10-CM | POA: Diagnosis not present

## 2020-09-21 DIAGNOSIS — E7800 Pure hypercholesterolemia, unspecified: Secondary | ICD-10-CM | POA: Diagnosis not present

## 2020-09-21 DIAGNOSIS — I428 Other cardiomyopathies: Secondary | ICD-10-CM

## 2020-09-21 LAB — ECHOCARDIOGRAM COMPLETE
AR max vel: 1.85 cm2
AV Area VTI: 1.67 cm2
AV Area mean vel: 1.79 cm2
AV Mean grad: 6 mmHg
AV Peak grad: 10.5 mmHg
Ao pk vel: 1.62 m/s
Area-P 1/2: 3.66 cm2
Calc EF: 42.5 %
S' Lateral: 4.4 cm
Single Plane A2C EF: 39 %
Single Plane A4C EF: 43.4 %

## 2020-09-21 MED ORDER — PERFLUTREN LIPID MICROSPHERE
1.0000 mL | INTRAVENOUS | Status: AC | PRN
  Administered 2020-09-21: 2 mL via INTRAVENOUS

## 2020-09-24 ENCOUNTER — Encounter: Payer: Self-pay | Admitting: Cardiology

## 2020-09-24 ENCOUNTER — Telehealth (INDEPENDENT_AMBULATORY_CARE_PROVIDER_SITE_OTHER): Payer: 59 | Admitting: Cardiology

## 2020-09-24 VITALS — Ht 69.0 in

## 2020-09-24 DIAGNOSIS — I428 Other cardiomyopathies: Secondary | ICD-10-CM

## 2020-09-24 DIAGNOSIS — I10 Essential (primary) hypertension: Secondary | ICD-10-CM

## 2020-09-24 DIAGNOSIS — E7800 Pure hypercholesterolemia, unspecified: Secondary | ICD-10-CM

## 2020-09-24 NOTE — Patient Instructions (Addendum)
Medication Instructions:  No Changes *If you need a refill on your cardiac medications before your next appointment, please call your pharmacy*   Lab Work: None Ordered If you have labs (blood work) drawn today and your tests are completely normal, you will receive your results only by:  [place] (if you have MyChart) OR  A paper copy in the mail If you have any lab test that is abnormal or we need to change your treatment, we will call you to review the results.   Testing/Procedures: Your physician has requested that you have an echocardiogram in 6 months prior to follow up appointment. Echocardiography is a painless test that uses sound waves to create images of your heart. It provides your doctor with information about the size and shape of your heart and how well your hearts chambers and valves are working. This procedure takes approximately one hour. There are no restrictions for this procedure.    Follow-Up: At [facility], you and your health needs are our priority.  As part of our continuing mission to provide you with exceptional heart care, we have created designated Provider Care Teams.  These Care Teams include your primary Cardiologist (physician) and Advanced Practice Providers (APPs -  Physician Assistants and Nurse Practitioners) who all work together to provide you with the care you need, when you need it.  We recommend signing up for the patient portal called "MyChart".  Sign up information is provided on this After Visit Summary.  MyChart is used to connect with patients for Virtual Visits (Telemedicine).  Patients are able to view lab/test results, encounter notes, upcoming appointments, etc.  Non-urgent messages can be sent to your provider as well.   To learn more about what you can do with MyChart, go to [email or web address].    Your next appointment:   6 month(s)  The format for your next appointment:   In Person  Provider:   Kate Sable,  MD   Other Instructions

## 2020-09-24 NOTE — Progress Notes (Signed)
Virtual Visit via Telephone Note   This visit type was conducted due to national recommendations for restrictions regarding the COVID-19 Pandemic (e.g. social distancing) in an effort to limit this patient's exposure and mitigate transmission in our community.  Due to his co-morbid illnesses, this patient is at least at moderate risk for complications without adequate follow up.  This format is felt to be most appropriate for this patient at this time.  The patient did not have access to video technology/had technical difficulties with video requiring transitioning to audio format only (telephone).  All issues noted in this document were discussed and addressed.  No physical exam could be performed with this format.  Please refer to the patient's chart for his  consent to telehealth for [facility].   Date:  09/24/2020   ID:  Alexander Drop., DOB 1966-03-22, MRN [id number]  Patient Location: Home Provider Location: Office/Clinic  PCP:  Rutherford Guys, MD  Cardiologist:  Kate Sable, MD  Electrophysiologist:  None   Evaluation Performed:  Follow-Up Visit  Chief Complaint: Follow-up echocardiogram results  History of Present Illness:    Alexander Quam. is a 54 y.o. male with history of diabetes, nonischemic cardiomyopathy last EF 30 to 35%, hyperlipidemia, hypertension who presents for follow-up.  Patient takes medications as prescribed including Coreg, losartan, spironolactone.  Repeat echocardiogram was performed to evaluate systolic function on current CHF medications.  He denies any symptoms of shortness of breath at rest or with exertion.  Denies edema.  Feels well overall.  Is no new concerns at this time will like to know his echocardiogram results.  The patient does not have symptoms concerning for COVID-19 infection (fever, chills, cough, or new shortness of breath).    Past Medical History:  Diagnosis Date  . Diabetes mellitus without complication ([place])   . HFrEF  (heart failure with reduced ejection fraction) ([place]) 03/2020   Echo 03/2020 LVEF 30-35%  . Hyperlipidemia   . Hypertension    Past Surgical History:  Procedure Laterality Date  . I & D EXTREMITY Left 11/24/2017   Procedure: IRRIGATION AND DEBRIDEMENT LEFT THIGH ABSCESS;  Surgeon: Erroll Luna, MD;  Location: [place];  Service: General;  Laterality: Left;  . ORIF HIP FRACTURE Right   . RIGHT/LEFT HEART CATH AND CORONARY ANGIOGRAPHY N/A 04/30/2020   Procedure: RIGHT/LEFT HEART CATH AND CORONARY ANGIOGRAPHY;  Surgeon: Wellington Hampshire, MD;  Location: [facility];  Service: Cardiovascular;  Laterality: N/A;     Current Meds  Medication Sig  . albuterol (VENTOLIN HFA) 108 (90 Base) MCG/ACT inhaler Inhale 2 puffs into the lungs every 4 (four) hours as needed for wheezing or shortness of breath.   Marland Kitchen aspirin 81 MG tablet Take 1 tablet (81 mg total) by mouth every other day.  . clotrimazole (LOTRIMIN) 1 % cream Apply 1 application topically 2 (two) times daily.  . furosemide (LASIX) 20 MG tablet Take 1 tablet (20 mg total) by mouth daily.  Marland Kitchen glipiZIDE (GLUCOTROL) 10 MG tablet TAKE 2 TABLETS BY MOUTH  TWICE DAILY BEFORE MEALS  . insulin glargine (LANTUS SOLOSTAR) 100 UNIT/ML Solostar Pen Inject 20 Units into the skin at bedtime.  . Insulin Pen Needle 32G X 4 MM MISC Use 1x a day  . losartan (COZAAR) 100 MG tablet Take 1 tablet (100 mg total) by mouth daily.  . metFORMIN (GLUCOPHAGE) 1000 MG tablet TAKE 1 TABLET BY MOUTH TWICE A DAY WITH MEALS  . Multiple Vitamin (MULTIVITAMIN) tablet Take  1 tablet by mouth daily.  Marland Kitchen OZEMPIC, 1 MG/DOSE, 2 MG/1.5ML SOPN INJECT 1 MG INTO THE SKIN  WEEKLY  . sildenafil (VIAGRA) 100 MG tablet TAKE 1 TABLET BY MOUTH AS  NEEDED FOR ERECTILE  DYSFUNCTION AS DIRECTED  . simvastatin (ZOCOR) 40 MG tablet Take 1 tablet (40 mg total) by mouth daily.  Marland Kitchen spironolactone (ALDACTONE) 25 MG tablet Take 1 tablet (25 mg total) by mouth daily.     Allergies:   Patient has no  known allergies.   Social History   Tobacco Use  . Smoking status: Never Smoker  . Smokeless tobacco: Never Used  Substance Use Topics  . Alcohol use: Yes    Alcohol/week: 2.0 standard drinks    Types: 2 Standard drinks or equivalent per week    Comment: social   . Drug use: No     Family Hx: The patient's family history includes Diabetes in his father; Heart attack (age of onset: 62) in his father.  ROS:   Please see the history of present illness.     All other systems reviewed and are negative.   Prior CV studies:   The following studies were reviewed today:    Labs/Other Tests and Data Reviewed:    EKG:  No ECG reviewed.  Recent Labs: 03/19/2020: ALT 13; TSH 1.550 04/09/2020: BUN 20; Creatinine, Ser 0.86; Hemoglobin 13.9; Platelets 246; Potassium 4.1; Sodium 139   Recent Lipid Panel Lab Results  Component Value Date/Time   CHOL 135 03/19/2020 08:56 AM   TRIG 126 03/19/2020 08:56 AM   HDL 66 03/19/2020 08:56 AM   CHOLHDL 2.0 03/19/2020 08:56 AM   CHOLHDL 2.5 10/21/2016 12:23 PM   LDLCALC 47 03/19/2020 08:56 AM    Wt Readings from Last 3 Encounters:  06/18/20 233 lb 4 oz (105.8 kg)  06/18/20 234 lb 3.2 oz (106.2 kg)  05/14/20 231 lb (104.8 kg)     Objective:    Vital Signs:  Ht 5\' 9"  (1.753 m)   BMI 34.45 kg/m  Blood pressure measured at home was 120/75  VITAL SIGNS:  reviewed  ASSESSMENT & PLAN:    1. History of nonischemic cardiomyopathy, previous EF 30 to 35%.  Patient describes NYHA class II symptoms.  Echo performed on 09/21/2020 shows improvement in ejection fraction now 45 to 50%.  Patient made aware of results.  Continue current medical therapy of Coreg 12.5 twice daily, losartan 100 mg daily, spironolactone 25 mg daily.  Can take Lasix as needed for edema or weight gain.  Follow-up in 3 months. 2. History of hypertension, continue medications as above.  Blood pressure controlled. 3. History of hyperlipidemia, continue statin as  prescribed.  Follow-up in 6 months.  Repeat echocardiogram prior to follow-up visit.  COVID-19 Education: The signs and symptoms of COVID-19 were discussed with the patient and how to seek care for testing (follow up with PCP or arrange E-visit).  The importance of social distancing was discussed today.  Time:   Today, I have spent 40 minutes with the patient with telehealth technology discussing the above problems.     Medication Adjustments/Labs and Tests Ordered: Current medicines are reviewed at length with the patient today.  Concerns regarding medicines are outlined above.   Tests Ordered: Orders Placed This Encounter  Procedures  . ECHOCARDIOGRAM COMPLETE    Medication Changes: No orders of the defined types were placed in this encounter.   Follow Up:  In Person in 6 month(s)  Signed, Kate Sable, MD  09/24/2020 12:25 PM    [facility]

## 2020-10-31 ENCOUNTER — Other Ambulatory Visit: Payer: Self-pay | Admitting: Internal Medicine

## 2020-12-10 ENCOUNTER — Ambulatory Visit (INDEPENDENT_AMBULATORY_CARE_PROVIDER_SITE_OTHER): Payer: 59 | Admitting: Family Medicine

## 2020-12-10 ENCOUNTER — Other Ambulatory Visit: Payer: Self-pay

## 2020-12-10 ENCOUNTER — Encounter: Payer: Self-pay | Admitting: Family Medicine

## 2020-12-10 ENCOUNTER — Ambulatory Visit: Payer: 59 | Admitting: Family Medicine

## 2020-12-10 VITALS — BP 128/78 | HR 82 | Temp 97.4°F | Ht 69.0 in | Wt 239.0 lb

## 2020-12-10 DIAGNOSIS — I10 Essential (primary) hypertension: Secondary | ICD-10-CM

## 2020-12-10 DIAGNOSIS — I428 Other cardiomyopathies: Secondary | ICD-10-CM

## 2020-12-10 DIAGNOSIS — N529 Male erectile dysfunction, unspecified: Secondary | ICD-10-CM

## 2020-12-10 DIAGNOSIS — E1165 Type 2 diabetes mellitus with hyperglycemia: Secondary | ICD-10-CM | POA: Diagnosis not present

## 2020-12-10 MED ORDER — SILDENAFIL CITRATE 100 MG PO TABS: ORAL_TABLET | ORAL | 0 refills | Status: DC

## 2020-12-10 MED ORDER — FUROSEMIDE 20 MG PO TABS: 20.0000 mg | ORAL_TABLET | Freq: Every day | ORAL | 2 refills | Status: DC

## 2020-12-10 NOTE — Progress Notes (Signed)
12/20/202110:05 AM  Alexander [name]. 1966/08/26, 54 y.o., male [id number]  Chief Complaint  Patient presents with  . Diabetes    Follow up FBS at home 150's to 200 's Extra dry skin on feet, itchy     HPI:   Patient is a 54 y.o. male with past medical history significant for HTN, CHF, DM2, HLP who presents today for routine follow-up.  Patient Care Team: Courtez Twaddle, Laurita Quint, FNP as PCP - General (Family Medicine) Kate Sable, MD as PCP - Cardiology (Cardiology)  Last visit Dr. Pamella Pert 06/18/20 Last endo visit may 2021 - Dr Cruzita Lederer, stopped actos, on lantus per note ok by DOT Last cards visit Oct 2021 - heart cath LVEF 30%, previous EF 30 to 35%.  Patient describes NYHA class II symptoms.  Echo performed on 09/21/2020 shows improvement in ejection fraction now 45 to 50%.  Patient made aware of results.  Continue current medical therapy of Coreg 12.5 twice daily, losartan 100 mg daily, spironolactone 25 mg daily.  Can take Lasix as needed for edema or weight gain.  Continue statin.   Colonoscopy age 61 (02/02/12) 07/10/20: cologuard ordered Will do after 1st of the year  Continues to have issues with ED Viagra is not working Has tried Cialis in the past Has seen urology in the past who had discussed injections   Depression screen [facility] 2/9 12/10/2020 06/18/2020 03/19/2020  Decreased Interest 0 0 0  Down, Depressed, Hopeless 0 0 0  PHQ - 2 Score 0 0 0    Fall Risk  12/10/2020 06/18/2020 03/19/2020 09/05/2019 05/30/2019  Falls in the past year? 0 0 0 0 0  Number falls in past yr: 0 - 0 0 0  Injury with Fall? 0 - 0 0 0  Follow up - Falls evaluation completed - - -     No Known Allergies  Prior to Admission medications   Medication Sig Start Date End Date Taking? Authorizing Provider  albuterol (VENTOLIN HFA) 108 (90 Base) MCG/ACT inhaler Inhale 2 puffs into the lungs every 4 (four) hours as needed for wheezing or shortness of breath.  01/15/20  Yes [provider]   aspirin 81 MG tablet Take 1 tablet (81 mg total) by mouth every other day. 05/28/18  Yes McVey, Gelene Mink, PA-C  clotrimazole (LOTRIMIN) 1 % cream Apply 1 application topically 2 (two) times daily. 08/02/20  Yes Jacelyn Pi, Lilia Argue, MD  furosemide (LASIX) 20 MG tablet Take 1 tablet (20 mg total) by mouth daily. 06/18/20  Yes Agbor-Etang, Aaron Edelman, MD  glipiZIDE (GLUCOTROL) 10 MG tablet TAKE 2 TABLETS BY MOUTH  TWICE DAILY BEFORE MEALS 08/09/20  Yes Jacelyn Pi, Irma M, MD  insulin glargine (LANTUS SOLOSTAR) 100 UNIT/ML Solostar Pen Inject 20 Units into the skin at bedtime. 05/07/20  Yes Philemon Kingdom, MD  Insulin Pen Needle 32G X 4 MM MISC Use 1x a day 02/06/20  Yes Philemon Kingdom, MD  losartan (COZAAR) 100 MG tablet Take 1 tablet (100 mg total) by mouth daily. 06/18/20  Yes Agbor-Etang, Aaron Edelman, MD  metFORMIN (GLUCOPHAGE) 1000 MG tablet TAKE 1 TABLET BY MOUTH TWICE A DAY WITH MEALS 09/01/20  Yes Jacelyn Pi, Lilia Argue, MD  Multiple Vitamin (MULTIVITAMIN) tablet Take 1 tablet by mouth daily.   Yes [provider]  OZEMPIC, 1 MG/DOSE, 2 MG/1.5ML SOPN INJECT 1 MG INTO THE SKIN  WEEKLY 06/11/20  Yes Philemon Kingdom, MD  OZEMPIC, 1 MG/DOSE, 4 MG/3ML SOPN INJECT 1 MG INTO THE SKIN  WEEKLY 11/01/20  Yes Philemon Kingdom, MD  sildenafil (VIAGRA) 100 MG tablet TAKE 1 TABLET BY MOUTH AS  NEEDED FOR ERECTILE  DYSFUNCTION AS DIRECTED 10/10/19  Yes Jacelyn Pi, Lilia Argue, MD  simvastatin (ZOCOR) 40 MG tablet Take 1 tablet (40 mg total) by mouth daily. 06/18/20  Yes Kate Sable, MD  spironolactone (ALDACTONE) 25 MG tablet Take 1 tablet (25 mg total) by mouth daily. 06/18/20  Yes Agbor-Etang, Aaron Edelman, MD  carvedilol (COREG) 12.5 MG tablet Take 1 tablet (12.5 mg total) by mouth 2 (two) times daily. 06/18/20 09/16/20  Kate Sable, MD  sildenafil (VIAGRA) 100 MG tablet TAKE 1 TABLET BY MOUTH AS  NEEDED FOR ERECTILE  DYSFUNCTION AS DIRECTED 06/20/19   Maximiano Coss, NP    Past Medical  History:  Diagnosis Date  . Diabetes mellitus without complication ([place])   . HFrEF (heart failure with reduced ejection fraction) ([place]) 03/2020   Echo 03/2020 LVEF 30-35%  . Hyperlipidemia   . Hypertension     Past Surgical History:  Procedure Laterality Date  . I & D EXTREMITY Left 11/24/2017   Procedure: IRRIGATION AND DEBRIDEMENT LEFT THIGH ABSCESS;  Surgeon: Erroll Luna, MD;  Location: [place];  Service: General;  Laterality: Left;  . ORIF HIP FRACTURE Right   . RIGHT/LEFT HEART CATH AND CORONARY ANGIOGRAPHY N/A 04/30/2020   Procedure: RIGHT/LEFT HEART CATH AND CORONARY ANGIOGRAPHY;  Surgeon: Wellington Hampshire, MD;  Location: [facility];  Service: Cardiovascular;  Laterality: N/A;    Social History   Tobacco Use  . Smoking status: Never Smoker  . Smokeless tobacco: Never Used  Substance Use Topics  . Alcohol use: Yes    Alcohol/week: 2.0 standard drinks    Types: 2 Standard drinks or equivalent per week    Comment: social     Family History  Problem Relation Age of Onset  . Diabetes Father   . Heart attack Father 40       Died age 66 MI    Review of Systems  Constitutional: Negative for chills, fever and malaise/fatigue.  Eyes: Negative for blurred vision and double vision.  Respiratory: Negative for cough, shortness of breath and wheezing.   Cardiovascular: Negative for chest pain, palpitations and leg swelling.  Gastrointestinal: Negative for abdominal pain, blood in stool, constipation, diarrhea, heartburn, nausea and vomiting.  Genitourinary: Negative for dysuria, frequency and hematuria.  Musculoskeletal: Negative for back pain and joint pain.  Skin: Negative for rash.       Dry feet  Neurological: Negative for dizziness, weakness and headaches.   BP Readings from Last 3 Encounters:  12/10/20 128/78  06/18/20 140/90  06/18/20 115/80     OBJECTIVE:  Today's Vitals   12/10/20 0911 12/10/20 0929  BP: (!) 151/92 128/78  Pulse: 82   Temp: (!)  97.4 F (36.3 C)   SpO2: 97%   Weight: 239 lb (108.4 kg)   Height: 5' 9"  (1.753 m)    Body mass index is 35.29 kg/m.   Physical Exam Vitals reviewed.  Constitutional:      Appearance: Normal appearance.  HENT:     Head: Normocephalic and atraumatic.  Eyes:     Conjunctiva/sclera: Conjunctivae normal.     Pupils: Pupils are equal, round, and reactive to light.  Cardiovascular:     Rate and Rhythm: Normal rate and regular rhythm.     Pulses: Normal pulses.     Heart sounds: Normal heart sounds. No murmur heard. No friction rub. No gallop.  Pulmonary:     Effort: Pulmonary effort is normal. No respiratory distress.     Breath sounds: Normal breath sounds. No stridor. No wheezing or rales.  Abdominal:     General: Bowel sounds are normal.     Palpations: Abdomen is soft.     Tenderness: There is no abdominal tenderness.  Musculoskeletal:     Right lower leg: No edema.     Left lower leg: No edema.  Skin:    General: Skin is warm and dry.  Neurological:     General: No focal deficit present.     Mental Status: He is alert and oriented to person, place, and time.  Psychiatric:        Mood and Affect: Mood normal.        Behavior: Behavior normal.     No results found for this or any previous visit (from the past 24 hour(s)).  No results found.   ASSESSMENT and PLAN  Problem List Items Addressed This Visit   None   Visit Diagnoses    Type 2 diabetes mellitus with hyperglycemia, without long-term current use of insulin ([place])    -  Primary   Relevant Orders   Hemoglobin A1c Will follow up with lab results.   NICM (nonischemic cardiomyopathy) (HCC)       Relevant Medications   furosemide (LASIX) 20 MG tablet   sildenafil (VIAGRA) 100 MG tablet  Discussed the importance of daily compression socks.   Essential hypertension       Relevant Medications   furosemide (LASIX) 20 MG tablet   sildenafil (VIAGRA) 100 MG tablet   Other Relevant Orders   CMP14+EGFR    Lipid panel  Will follow up with lab results.   Erectile dysfunction, unspecified erectile dysfunction type       Relevant Medications   sildenafil (VIAGRA) 100 MG tablet   Other Relevant Orders   Ambulatory referral to Urology      Return in about 3 months (around 03/10/2021).   Huston Foley Gurjot Brisco, FNP-BC Primary [facility] at [place] [place], [place] [address] Ph.  [phone] Fax [phone]

## 2020-12-10 NOTE — Patient Instructions (Addendum)
Health Maintenance, Male Adopting a healthy lifestyle and getting preventive care are important in promoting health and wellness. Ask your health care provider about:  The right schedule for you to have regular tests and exams.  Things you can do on your own to prevent diseases and keep yourself healthy. What should I know about diet, weight, and exercise? Eat a healthy diet   Eat a diet that includes plenty of vegetables, fruits, low-fat dairy products, and lean protein.  Do not eat a lot of foods that are high in solid fats, added sugars, or sodium. Maintain a healthy weight Body mass index (BMI) is a measurement that can be used to identify possible weight problems. It estimates body fat based on height and weight. Your health care provider can help determine your BMI and help you achieve or maintain a healthy weight. Get regular exercise Get regular exercise. This is one of the most important things you can do for your health. Most adults should:  Exercise for at least 150 minutes each week. The exercise should increase your heart rate and make you sweat (moderate-intensity exercise).  Do strengthening exercises at least twice a week. This is in addition to the moderate-intensity exercise.  Spend less time sitting. Even light physical activity can be beneficial. Watch cholesterol and blood lipids Have your blood tested for lipids and cholesterol at 55 years of age, then have this test every 5 years. You may need to have your cholesterol levels checked more often if:  Your lipid or cholesterol levels are high.  You are older than 55 years of age.  You are at high risk for heart disease. What should I know about cancer screening? Many types of cancers can be detected early and may often be prevented. Depending on your health history and family history, you may need to have cancer screening at various ages. This may include screening for:  Colorectal cancer.  Prostate  cancer.  Skin cancer.  Lung cancer. What should I know about heart disease, diabetes, and high blood pressure? Blood pressure and heart disease  High blood pressure causes heart disease and increases the risk of stroke. This is more likely to develop in people who have high blood pressure readings, are of African descent, or are overweight.  Talk with your health care provider about your target blood pressure readings.  Have your blood pressure checked: ? Every 3-5 years if you are 74-32 years of age. ? Every year if you are 66 years old or older.  If you are between the ages of 72 and 53 and are a current or former smoker, ask your health care provider if you should have a one-time screening for abdominal aortic aneurysm (AAA). Diabetes Have regular diabetes screenings. This checks your fasting blood sugar level. Have the screening done:  Once every three years after age 61 if you are at a normal weight and have a low risk for diabetes.  More often and at a younger age if you are overweight or have a high risk for diabetes. What should I know about preventing infection? Hepatitis B If you have a higher risk for hepatitis B, you should be screened for this virus. Talk with your health care provider to find out if you are at risk for hepatitis B infection. Hepatitis C Blood testing is recommended for:  Everyone born from [birth year] through 1966.  Anyone with known risk factors for hepatitis C. Sexually transmitted infections (STIs)  You should be screened each year  for STIs, including gonorrhea and chlamydia, if: ? You are sexually active and are younger than 55 years of age. ? You are older than 55 years of age and your health care provider tells you that you are at risk for this type of infection. ? Your sexual activity has changed since you were last screened, and you are at increased risk for chlamydia or gonorrhea. Ask your health care provider if you are at risk.  Ask your  health care provider about whether you are at high risk for HIV. Your health care provider may recommend a prescription medicine to help prevent HIV infection. If you choose to take medicine to prevent HIV, you should first get tested for HIV. You should then be tested every 3 months for as long as you are taking the medicine. Follow these instructions at home: Lifestyle  Do not use any products that contain nicotine or tobacco, such as cigarettes, e-cigarettes, and chewing tobacco. If you need help quitting, ask your health care provider.  Do not use street drugs.  Do not share needles.  Ask your health care provider for help if you need support or information about quitting drugs. Alcohol use  Do not drink alcohol if your health care provider tells you not to drink.  If you drink alcohol: ? Limit how much you have to 0-2 drinks a day. ? Be aware of how much alcohol is in your drink. In the [place] one drink equals one 12 oz bottle of beer (355 mL), one 5 oz glass of wine (148 mL), or one 1 oz glass of hard liquor (44 mL). General instructions  Schedule regular health, dental, and eye exams.  Stay current with your vaccines.  Tell your health care provider if: ? You often feel depressed. ? You have ever been abused or do not feel safe at home. Summary  Adopting a healthy lifestyle and getting preventive care are important in promoting health and wellness.  Follow your health care provider's instructions about healthy diet, exercising, and getting tested or screened for diseases.  Follow your health care provider's instructions on monitoring your cholesterol and blood pressure. This information is not intended to replace advice given to you by your health care provider. Make sure you discuss any questions you have with your health care provider. Document Revised: 12/01/2018 Document Reviewed: 12/01/2018 Elsevier Patient Education  [employer].   If you have lab work done  today you will be contacted with your lab results within the next 2 weeks.  If you have not heard from [place] then please contact us. The fastest way to get your results is to register for My Chart.   IF you received an x-ray today, you will receive an invoice from [facility]. Please contact [facility] at [phone] with questions or concerns regarding your invoice.   IF you received labwork today, you will receive an invoice from [facility]. Please contact [facility] at [phone] with questions or concerns regarding your invoice.   Our billing staff will not be able to assist you with questions regarding bills from these companies.  You will be contacted with the lab results as soon as they are available. The fastest way to get your results is to activate your My Chart account. Instructions are located on the last page of this paperwork. If you have not heard from [place] regarding the results in 2 weeks, please contact this office.

## 2020-12-24 ENCOUNTER — Other Ambulatory Visit: Payer: Self-pay

## 2020-12-24 DIAGNOSIS — I10 Essential (primary) hypertension: Secondary | ICD-10-CM

## 2020-12-24 MED ORDER — LOSARTAN POTASSIUM 100 MG PO TABS: 100.0000 mg | ORAL_TABLET | Freq: Every day | ORAL | 0 refills | Status: DC

## 2021-01-09 ENCOUNTER — Telehealth: Payer: Self-pay | Admitting: Cardiology

## 2021-01-09 DIAGNOSIS — I10 Essential (primary) hypertension: Secondary | ICD-10-CM

## 2021-01-09 MED ORDER — SPIRONOLACTONE 25 MG PO TABS: 25.0000 mg | ORAL_TABLET | Freq: Every day | ORAL | 2 refills | Status: DC

## 2021-01-09 NOTE — Telephone Encounter (Signed)
Received fax from [facility] requesting refills for Spironolactone 25 mg QD. Rx request sent to pharmacy.

## 2021-01-18 NOTE — Telephone Encounter (Signed)
Mychart

## 2021-02-07 ENCOUNTER — Other Ambulatory Visit: Payer: Self-pay | Admitting: *Deleted

## 2021-02-07 MED ORDER — SIMVASTATIN 40 MG PO TABS: 40.0000 mg | ORAL_TABLET | Freq: Every day | ORAL | 1 refills | Status: DC

## 2021-02-12 ENCOUNTER — Other Ambulatory Visit: Payer: Self-pay | Admitting: *Deleted

## 2021-02-12 MED ORDER — CARVEDILOL 12.5 MG PO TABS: 12.5000 mg | ORAL_TABLET | Freq: Two times a day (BID) | ORAL | 0 refills | Status: DC

## 2021-02-18 ENCOUNTER — Other Ambulatory Visit: Payer: Self-pay

## 2021-02-18 DIAGNOSIS — E119 Type 2 diabetes mellitus without complications: Secondary | ICD-10-CM

## 2021-02-18 MED ORDER — GLIPIZIDE 10 MG PO TABS: ORAL_TABLET | ORAL | 1 refills | Status: DC

## 2021-02-19 ENCOUNTER — Telehealth: Payer: Self-pay | Admitting: Internal Medicine

## 2021-02-19 NOTE — Telephone Encounter (Signed)
Please advise 

## 2021-02-20 NOTE — Telephone Encounter (Signed)
Patient needs appointment for further refills

## 2021-02-20 NOTE — Telephone Encounter (Signed)
Appt set for 03/22/21 at 1:40

## 2021-02-20 NOTE — Telephone Encounter (Signed)
Please call patient

## 2021-03-11 ENCOUNTER — Ambulatory Visit (INDEPENDENT_AMBULATORY_CARE_PROVIDER_SITE_OTHER): Payer: 59 | Admitting: Family Medicine

## 2021-03-11 ENCOUNTER — Encounter: Payer: Self-pay | Admitting: Family Medicine

## 2021-03-11 ENCOUNTER — Other Ambulatory Visit: Payer: Self-pay

## 2021-03-11 VITALS — BP 136/93 | HR 92 | Temp 97.5°F | Resp 99 | Ht 69.0 in | Wt 237.0 lb

## 2021-03-11 DIAGNOSIS — E1165 Type 2 diabetes mellitus with hyperglycemia: Secondary | ICD-10-CM

## 2021-03-11 DIAGNOSIS — I10 Essential (primary) hypertension: Secondary | ICD-10-CM | POA: Diagnosis not present

## 2021-03-11 DIAGNOSIS — Z1211 Encounter for screening for malignant neoplasm of colon: Secondary | ICD-10-CM

## 2021-03-11 DIAGNOSIS — E7800 Pure hypercholesterolemia, unspecified: Secondary | ICD-10-CM | POA: Diagnosis not present

## 2021-03-11 DIAGNOSIS — IMO0002 Reserved for concepts with insufficient information to code with codable children: Secondary | ICD-10-CM

## 2021-03-11 DIAGNOSIS — E1142 Type 2 diabetes mellitus with diabetic polyneuropathy: Secondary | ICD-10-CM

## 2021-03-11 NOTE — Progress Notes (Signed)
3/21/20228:48 AM  Alexander [name]. [birth year], 55 y.o., male [id number]  Chief Complaint  Patient presents with  . Diabetes    Usually around 165   . Hypertension    Usally higher readings around 136/87   . sharp pains in Left hip and leg    Still going on got a cortisone injection in hip a month ago     HPI:   Patient is a 55 y.o. male with past medical history significant for HTN, CHF, DM2, HLP who presents today for routine follow-up.  Patient Care Team: Javani Spratt, Laurita Quint, FNP as PCP - General (Family Medicine) Kate Sable, MD as PCP - Cardiology (Cardiology)  Has been having pain from back down his leg Was told it was a pulled muscle Was given a cortisone injection and a muscle relaxer Pain comes and goes now, has occasional flares Declines PT at this time  Last endo visit may 2021 - Dr Cruzita Lederer, stopped actos, on lantus per note ok by DOT Will see again April BG range 120-165 in the evening In the morning 120-130 Eye exam scheduled for next monday  Last cards visit Oct 2021 - heart cath LVEF 30%, previous EF 30 to 35%.  Patient describes NYHA class II symptoms.  Echo performed on 09/21/2020 shows improvement in ejection fraction now 45 to 50%.  Patient made aware of results.  Continue current medical therapy of Coreg 12.5 twice daily, losartan 100 mg daily, spironolactone 25 mg daily.  Can take Lasix as needed for edema or weight gain.  Continue statin.  HTN Carvedilol Losartan 100 Sprinolactone Checks BP at home Runs 130s/80s Will continue to work on LFM BP Readings from Last 3 Encounters:  03/11/21 (!) 136/93  12/10/20 128/78  06/18/20 140/90    Never received cologuard Would like colonoscopy referral  Continues to have issues with ED Viagra is not working Has tried Cialis in the past Has seen urology in the past who had discussed injections Will see Urology end of the month   Depression screen [facility] 2/9 03/11/2021 12/10/2020 06/18/2020  Decreased  Interest 0 0 0  Down, Depressed, Hopeless 0 0 0  PHQ - 2 Score 0 0 0    Fall Risk  03/11/2021 12/10/2020 06/18/2020 03/19/2020 09/05/2019  Falls in the past year? 0 0 0 0 0  Number falls in past yr: 0 0 - 0 0  Injury with Fall? 0 0 - 0 0  Follow up Falls evaluation completed - Falls evaluation completed - -     No Known Allergies  Prior to Admission medications   Medication Sig Start Date End Date Taking? Authorizing Provider  albuterol (VENTOLIN HFA) 108 (90 Base) MCG/ACT inhaler Inhale 2 puffs into the lungs every 4 (four) hours as needed for wheezing or shortness of breath.  01/15/20  Yes [provider]  aspirin 81 MG tablet Take 1 tablet (81 mg total) by mouth every other day. 05/28/18  Yes McVey, Gelene Mink, PA-C  clotrimazole (LOTRIMIN) 1 % cream Apply 1 application topically 2 (two) times daily. 08/02/20  Yes Jacelyn Pi, Lilia Argue, MD  furosemide (LASIX) 20 MG tablet Take 1 tablet (20 mg total) by mouth daily. 06/18/20  Yes Agbor-Etang, Aaron Edelman, MD  glipiZIDE (GLUCOTROL) 10 MG tablet TAKE 2 TABLETS BY MOUTH  TWICE DAILY BEFORE MEALS 08/09/20  Yes Jacelyn Pi, Irma M, MD  insulin glargine (LANTUS SOLOSTAR) 100 UNIT/ML Solostar Pen Inject 20 Units into the skin at bedtime. 05/07/20  Yes Philemon Kingdom,  MD  Insulin Pen Needle 32G X 4 MM MISC Use 1x a day 02/06/20  Yes Philemon Kingdom, MD  losartan (COZAAR) 100 MG tablet Take 1 tablet (100 mg total) by mouth daily. 06/18/20  Yes Agbor-Etang, Aaron Edelman, MD  metFORMIN (GLUCOPHAGE) 1000 MG tablet TAKE 1 TABLET BY MOUTH TWICE A DAY WITH MEALS 09/01/20  Yes Jacelyn Pi, Lilia Argue, MD  Multiple Vitamin (MULTIVITAMIN) tablet Take 1 tablet by mouth daily.   Yes [provider]  OZEMPIC, 1 MG/DOSE, 2 MG/1.5ML SOPN INJECT 1 MG INTO THE SKIN  WEEKLY 06/11/20  Yes Philemon Kingdom, MD  OZEMPIC, 1 MG/DOSE, 4 MG/3ML SOPN INJECT 1 MG INTO THE SKIN  WEEKLY 11/01/20  Yes Philemon Kingdom, MD  sildenafil (VIAGRA) 100 MG tablet TAKE 1 TABLET  BY MOUTH AS  NEEDED FOR ERECTILE  DYSFUNCTION AS DIRECTED 10/10/19  Yes Jacelyn Pi, Lilia Argue, MD  simvastatin (ZOCOR) 40 MG tablet Take 1 tablet (40 mg total) by mouth daily. 06/18/20  Yes Kate Sable, MD  spironolactone (ALDACTONE) 25 MG tablet Take 1 tablet (25 mg total) by mouth daily. 06/18/20  Yes Agbor-Etang, Aaron Edelman, MD  carvedilol (COREG) 12.5 MG tablet Take 1 tablet (12.5 mg total) by mouth 2 (two) times daily. 06/18/20 09/16/20  Kate Sable, MD  sildenafil (VIAGRA) 100 MG tablet TAKE 1 TABLET BY MOUTH AS  NEEDED FOR ERECTILE  DYSFUNCTION AS DIRECTED 06/20/19   Maximiano Coss, NP    Past Medical History:  Diagnosis Date  . Diabetes mellitus without complication ([place])   . HFrEF (heart failure with reduced ejection fraction) ([place]) 03/2020   Echo 03/2020 LVEF 30-35%  . Hyperlipidemia   . Hypertension     Past Surgical History:  Procedure Laterality Date  . I & D EXTREMITY Left 11/24/2017   Procedure: IRRIGATION AND DEBRIDEMENT LEFT THIGH ABSCESS;  Surgeon: Erroll Luna, MD;  Location: [place] [facility];  Service: General;  Laterality: Left;  . ORIF HIP FRACTURE Right   . RIGHT/LEFT HEART CATH AND CORONARY ANGIOGRAPHY N/A 04/30/2020   Procedure: RIGHT/LEFT HEART CATH AND CORONARY ANGIOGRAPHY;  Surgeon: Wellington Hampshire, MD;  Location: [facility];  Service: Cardiovascular;  Laterality: N/A;    Social History   Tobacco Use  . Smoking status: Never Smoker  . Smokeless tobacco: Never Used  Substance Use Topics  . Alcohol use: Yes    Alcohol/week: 2.0 standard drinks    Types: 2 Standard drinks or equivalent per week    Comment: social     Family History  Problem Relation Age of Onset  . Diabetes Father   . Heart attack Father 59       Died age 68 MI    Review of Systems  Constitutional: Negative for chills, fever and malaise/fatigue.  Eyes: Negative for blurred vision and double vision.  Respiratory: Negative for cough, shortness of breath and wheezing.    Cardiovascular: Negative for chest pain, palpitations and leg swelling.  Gastrointestinal: Negative for abdominal pain, blood in stool, constipation, diarrhea, heartburn, nausea and vomiting.  Genitourinary: Negative for dysuria, frequency and hematuria.  Musculoskeletal: Positive for back pain. Negative for joint pain.  Skin: Negative for rash.       Dry feet  Neurological: Negative for dizziness, weakness and headaches.   BP Readings from Last 3 Encounters:  03/11/21 (!) 136/93  12/10/20 128/78  06/18/20 140/90     OBJECTIVE:  Today's Vitals   03/11/21 0830  BP: (!) 136/93  Pulse: 92  Resp: (!) 99  Temp: (!) 97.5 F (36.4 C)  Weight: 237 lb (107.5 kg)  Height: 5' 9"  (1.753 m)   Body mass index is 35 kg/m.   Physical Exam Vitals reviewed.  Constitutional:      Appearance: Normal appearance.  HENT:     Head: Normocephalic and atraumatic.  Eyes:     Conjunctiva/sclera: Conjunctivae normal.     Pupils: Pupils are equal, round, and reactive to light.  Cardiovascular:     Rate and Rhythm: Normal rate and regular rhythm.     Pulses: Normal pulses.     Heart sounds: Normal heart sounds. No murmur heard. No friction rub. No gallop.   Pulmonary:     Effort: Pulmonary effort is normal. No respiratory distress.     Breath sounds: Normal breath sounds. No stridor. No wheezing or rales.  Abdominal:     General: Bowel sounds are normal.     Palpations: Abdomen is soft.     Tenderness: There is no abdominal tenderness.  Musculoskeletal:     Right lower leg: No edema.     Left lower leg: No edema.  Skin:    General: Skin is warm and dry.  Neurological:     General: No focal deficit present.     Mental Status: He is alert and oriented to person, place, and time.  Psychiatric:        Mood and Affect: Mood normal.        Behavior: Behavior normal.     No results found for this or any previous visit (from the past 24 hour(s)).  No results found.   ASSESSMENT and  PLAN   Problem List Items Addressed This Visit      Cardiovascular and Mediastinum   HTN (hypertension)   Relevant Orders   CMP14+EGFR     Endocrine   Uncontrolled type 2 diabetes mellitus with peripheral neuropathy ([place])   Relevant Orders   Hemoglobin A1c     Other   Pure hypercholesterolemia   Relevant Orders   Lipid Panel    Other Visit Diagnoses    Special screening for malignant neoplasms, colon    -  Primary   Relevant Orders   Ambulatory referral to Gastroenterology        Plan Declines refills at this time Will follow up with lab results Chronic conditions stable under current regimens Placed referral for colonoscopy  Return in about 3 months (around 06/11/2021).   Huston Foley Nashid Pellum, FNP-BC [facility] [place], [place] [address] Ph.  [phone] Fax [phone]

## 2021-03-11 NOTE — Patient Instructions (Addendum)
Health Maintenance, Male Adopting a healthy lifestyle and getting preventive care are important in promoting health and wellness. Ask your health care provider about:  The right schedule for you to have regular tests and exams.  Things you can do on your own to prevent diseases and keep yourself healthy. What should I know about diet, weight, and exercise? Eat a healthy diet  Eat a diet that includes plenty of vegetables, fruits, low-fat dairy products, and lean protein.  Do not eat a lot of foods that are high in solid fats, added sugars, or sodium.   Maintain a healthy weight Body mass index (BMI) is a measurement that can be used to identify possible weight problems. It estimates body fat based on height and weight. Your health care provider can help determine your BMI and help you achieve or maintain a healthy weight. Get regular exercise Get regular exercise. This is one of the most important things you can do for your health. Most adults should:  Exercise for at least 150 minutes each week. The exercise should increase your heart rate and make you sweat (moderate-intensity exercise).  Do strengthening exercises at least twice a week. This is in addition to the moderate-intensity exercise.  Spend less time sitting. Even light physical activity can be beneficial. Watch cholesterol and blood lipids Have your blood tested for lipids and cholesterol at 56 years of age, then have this test every 5 years. You may need to have your cholesterol levels checked more often if:  Your lipid or cholesterol levels are high.  You are older than 56 years of age.  You are at high risk for heart disease. What should I know about cancer screening? Many types of cancers can be detected early and may often be prevented. Depending on your health history and family history, you may need to have cancer screening at various ages. This may include screening for:  Colorectal cancer.  Prostate  cancer.  Skin cancer.  Lung cancer. What should I know about heart disease, diabetes, and high blood pressure? Blood pressure and heart disease  High blood pressure causes heart disease and increases the risk of stroke. This is more likely to develop in people who have high blood pressure readings, are of African descent, or are overweight.  Talk with your health care provider about your target blood pressure readings.  Have your blood pressure checked: ? Every 3-5 years if you are 18-39 years of age. ? Every year if you are 40 years old or older.  If you are between the ages of 65 and 75 and are a current or former smoker, ask your health care provider if you should have a one-time screening for abdominal aortic aneurysm (AAA). Diabetes Have regular diabetes screenings. This checks your fasting blood sugar level. Have the screening done:  Once every three years after age 45 if you are at a normal weight and have a low risk for diabetes.  More often and at a younger age if you are overweight or have a high risk for diabetes. What should I know about preventing infection? Hepatitis B If you have a higher risk for hepatitis B, you should be screened for this virus. Talk with your health care provider to find out if you are at risk for hepatitis B infection. Hepatitis C Blood testing is recommended for:  Everyone born from 1945 through 1965.  Anyone with known risk factors for hepatitis C. Sexually transmitted infections (STIs)  You should be screened each   year for STIs, including gonorrhea and chlamydia, if: ? You are sexually active and are younger than 56 years of age. ? You are older than 56 years of age and your health care provider tells you that you are at risk for this type of infection. ? Your sexual activity has changed since you were last screened, and you are at increased risk for chlamydia or gonorrhea. Ask your health care provider if you are at risk.  Ask your  health care provider about whether you are at high risk for HIV. Your health care provider may recommend a prescription medicine to help prevent HIV infection. If you choose to take medicine to prevent HIV, you should first get tested for HIV. You should then be tested every 3 months for as long as you are taking the medicine. Follow these instructions at home: Lifestyle  Do not use any products that contain nicotine or tobacco, such as cigarettes, e-cigarettes, and chewing tobacco. If you need help quitting, ask your health care provider.  Do not use street drugs.  Do not share needles.  Ask your health care provider for help if you need support or information about quitting drugs. Alcohol use  Do not drink alcohol if your health care provider tells you not to drink.  If you drink alcohol: ? Limit how much you have to 0-2 drinks a day. ? Be aware of how much alcohol is in your drink. In the [place] one drink equals one 12 oz bottle of beer (355 mL), one 5 oz glass of wine (148 mL), or one 1 oz glass of hard liquor (44 mL). General instructions  Schedule regular health, dental, and eye exams.  Stay current with your vaccines.  Tell your health care provider if: ? You often feel depressed. ? You have ever been abused or do not feel safe at home. Summary  Adopting a healthy lifestyle and getting preventive care are important in promoting health and wellness.  Follow your health care provider's instructions about healthy diet, exercising, and getting tested or screened for diseases.  Follow your health care provider's instructions on monitoring your cholesterol and blood pressure. This information is not intended to replace advice given to you by your health care provider. Make sure you discuss any questions you have with your health care provider. Document Revised: 12/01/2018 Document Reviewed: 12/01/2018 Elsevier Patient Education  2021 [employer].     If you have lab work  done today you will be contacted with your lab results within the next 2 weeks.  If you have not heard from [place] then please contact us. The fastest way to get your results is to register for My Chart.   IF you received an x-ray today, you will receive an invoice from [facility]. Please contact [facility] at [phone] with questions or concerns regarding your invoice.   IF you received labwork today, you will receive an invoice from [facility]. Please contact [facility] at [phone] with questions or concerns regarding your invoice.   Our billing staff will not be able to assist you with questions regarding bills from these companies.  You will be contacted with the lab results as soon as they are available. The fastest way to get your results is to activate your My Chart account. Instructions are located on the last page of this paperwork. If you have not heard from [place] regarding the results in 2 weeks, please contact this office.

## 2021-03-13 LAB — HEMOGLOBIN A1C
Est. average glucose Bld gHb Est-mCnc: 237 mg/dL
Hgb A1c MFr Bld: 9.9 % — ABNORMAL HIGH (ref 4.8–5.6)

## 2021-03-13 LAB — CMP14+EGFR
ALT: 37 IU/L (ref 0–44)
AST: 36 IU/L (ref 0–40)
Albumin/Globulin Ratio: 1.7 (ref 1.2–2.2)
Albumin: 4.7 g/dL (ref 3.8–4.9)
Alkaline Phosphatase: 72 IU/L (ref 44–121)
BUN/Creatinine Ratio: 19 (ref 9–20)
BUN: 19 mg/dL (ref 6–24)
Bilirubin Total: 0.6 mg/dL (ref 0.0–1.2)
CO2: 18 mmol/L — ABNORMAL LOW (ref 20–29)
Calcium: 9.2 mg/dL (ref 8.7–10.2)
Chloride: 100 mmol/L (ref 96–106)
Creatinine, Ser: 1.01 mg/dL (ref 0.76–1.27)
Globulin, Total: 2.8 g/dL (ref 1.5–4.5)
Glucose: 286 mg/dL — ABNORMAL HIGH (ref 65–99)
Potassium: 4.8 mmol/L (ref 3.5–5.2)
Sodium: 138 mmol/L (ref 134–144)
Total Protein: 7.5 g/dL (ref 6.0–8.5)
eGFR: 87 mL/min/{1.73_m2} (ref >59)

## 2021-03-13 LAB — LIPID PANEL
Chol/HDL Ratio: 2.3 ratio (ref 0.0–5.0)
Cholesterol, Total: 156 mg/dL (ref 100–199)
HDL: 68 mg/dL (ref >39)
LDL Chol Calc (NIH): 72 mg/dL (ref 0–99)
Triglycerides: 83 mg/dL (ref 0–149)
VLDL Cholesterol Cal: 16 mg/dL (ref 5–40)

## 2021-03-18 LAB — HM DIABETES EYE EXAM

## 2021-03-22 ENCOUNTER — Encounter: Payer: Self-pay | Admitting: Internal Medicine

## 2021-03-22 ENCOUNTER — Ambulatory Visit: Payer: 59 | Admitting: Internal Medicine

## 2021-03-22 NOTE — Progress Notes (Deleted)
Patient ID: Alexander Kritikos., male   DOB: [birth year], 55 y.o.   MRN: [id number]   This visit occurred during the SARS-CoV-2 public health emergency.  Safety protocols were in place, including screening questions prior to the visit, additional usage of staff PPE, and extensive cleaning of exam room while observing appropriate contact time as indicated for disinfecting solutions.    HPI: Alexander Hackler. is a 55 y.o.-year-old male, initially referred by his PCP,  Just, Alexander Quint, FNP,  returning for follow-up for DM2, dx in 2005, insulin-dependent, uncontrolled, with complications (CHF, PN, ED - saw Dr. Loanne Drilling for this in 2014).  Last visit 10 months ago.  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  However, DOT approved insulin for him in 2020. Actos was stopped 04/2020 by cardiology.  He used the CGM before but ran out of sensors.  Pt checks his sugars 1-2 times a day: - am: 130, 150-175, 200 >> 100-180 >> 115-180 - 2h after b'fast: n/c - before lunch: n/c - 2h after lunch: n/c - before dinner: n/c - 2h after dinner: 200-225 >> 180-200s >> 120-130 - bedtime: n/c - nighttime: n/c Lowest sugar was 100 >> 115; it is unclear at which level he has hypoglycemia awareness. Highest sugar was 300...>> 230 (sweets at night).  Glucometer: ReliOn  Pt's meals are: - Breakfast: 2 eggs, 2 bacon pieces, OJ - Lunch: fast food, grilled fish + green beans,  cabbage, corn, no bread, unsweet tea - Dinner: Fish or chicken + veggies - Snacks: fruit, nuts  -No CKD, last BUN/creatinine:  Lab Results  Component Value Date   BUN 19 03/11/2021   BUN 20 04/09/2020   CREATININE 1.01 03/11/2021   CREATININE 0.86 04/09/2020  On Cozaar 100.  -+ HL; last set of lipids: Lab Results  Component Value Date   CHOL 156 03/11/2021   HDL 68 03/11/2021   LDLCALC 72 03/11/2021   TRIG 83 03/11/2021   CHOLHDL 2.3 03/11/2021  On Zocor 40.  - last eye exam was in 01/2020: No DR  - no numbness and tingling in his feet.  On ASA 81.  Reviewed the report of his catheterization from 04/30/2020:  There is moderate to severe left ventricular systolic dysfunction.  LV end diastolic pressure is moderately elevated.  The left ventricular ejection fraction is 25-35% by visual estimate.   1.  Minimal irregularities with no evidence of obstructive coronary artery disease. 2.  Moderately to severely reduced LV systolic function with an ejection fraction of 30%. 3.  Right heart catheterization showed mildly elevated left-sided filling pressures with wedge pressure of 19 mmHg, moderate pulmonary hypertension at 50/23 with a mean of 35 mmHg and normal cardiac output at 6.79 with a cardiac index of 3.11.  Pulmonary vascular resistance of 2.36 [place] unit.  Recommendations:  The patient has nonischemic cardiomyopathy for which I recommend continuing medical therapy. He is mildly volume overloaded which could be in part related to the preprocedure hydration.  He was admitted for heart catheterization on 04/30/2020.  No significant blockages were found.  ROS: Constitutional: no weight gain/no weight loss, no fatigue, no subjective hyperthermia, no subjective hypothermia Eyes: no blurry vision, no xerophthalmia ENT: no sore throat, no nodules palpated in neck, no dysphagia, no odynophagia, no hoarseness Cardiovascular: no CP/no SOB/no palpitations/no leg  swelling Respiratory: no cough/no SOB/no wheezing Gastrointestinal: no N/no V/no D/no C/no acid reflux Musculoskeletal: no muscle aches/no joint aches Skin: no rashes, no hair loss Neurological: no tremors/no numbness/no tingling/no dizziness  I reviewed pt's medications, allergies, PMH, social hx, family hx, and changes were documented in the history of present illness. Otherwise, unchanged from my initial visit note.  Past Medical History:  Diagnosis Date  . Diabetes mellitus without complication ([place])   . HFrEF (heart failure with reduced ejection fraction) ([place]) 03/2020   Echo 03/2020 LVEF 30-35%  . Hyperlipidemia   . Hypertension    Past Surgical History:  Procedure Laterality Date  . I & D EXTREMITY Left 11/24/2017   Procedure: IRRIGATION AND DEBRIDEMENT LEFT THIGH ABSCESS;  Surgeon: Erroll Luna, MD;  Location: [facility];  Service: General;  Laterality: Left;  . ORIF HIP FRACTURE Right   . RIGHT/LEFT HEART CATH AND CORONARY ANGIOGRAPHY N/A 04/30/2020   Procedure: RIGHT/LEFT HEART CATH AND CORONARY ANGIOGRAPHY;  Surgeon: Wellington Hampshire, MD;  Location: [facility] [facility];  Service: Cardiovascular;  Laterality: N/A;   Social History   Socioeconomic History  . Marital status: Married    Spouse name: Alexander [name]  . Number of children: 1  . Years of education: college  . Highest education level: Not on file  Occupational History  . Occupation: Self employed  Tobacco Use  . Smoking status: Never Smoker  . Smokeless tobacco: Never Used  Substance and Sexual Activity  . Alcohol use: Yes    Alcohol/week: 2.0 standard drinks    Types: 2 Standard drinks or equivalent per week    Comment: social   . Drug use: No  . Sexual activity: Yes    Partners: Female  Other Topics Concern  . Not on file  Social History Narrative   Lives with wife.     Social Determinants of Health   Financial Resource Strain: Not on file  Food Insecurity: Not on file  Transportation Needs: Not on  file  Physical Activity: Not on file  Stress: Not on file  Social Connections: Not on file  Intimate Partner Violence: Not on file   Current Outpatient Medications on File Prior to Visit  Medication Sig Dispense Refill  . albuterol (VENTOLIN HFA) 108 (90 Base) MCG/ACT inhaler Inhale 2 puffs into the lungs every 4 (four) hours as needed for wheezing or shortness of breath.     Marland Kitchen aspirin 81 MG tablet Take 1 tablet (81 mg total) by mouth every other day. 30 tablet 11  . carvedilol (COREG) 12.5 MG tablet Take 1 tablet (12.5 mg total) by mouth 2 (two) times daily. 180 tablet 0  . clotrimazole (LOTRIMIN) 1 % cream Apply 1 application topically 2 (two) times daily. 30 g 0  . furosemide (LASIX) 20 MG tablet Take 1 tablet (20 mg total) by mouth daily. 90 tablet 2  . glipiZIDE (GLUCOTROL) 10 MG tablet TAKE 2 TABLETS BY MOUTH  TWICE DAILY BEFORE MEALS 360 tablet 1  .  insulin glargine (LANTUS SOLOSTAR) 100 UNIT/ML Solostar Pen Inject 18 units at bedtime. 15 mL 0  . Insulin Pen Needle 32G X 4 MM MISC Use 1x a day 100 each 3  . losartan (COZAAR) 100 MG tablet Take 1 tablet (100 mg total) by mouth daily. 90 tablet 0  . metFORMIN (GLUCOPHAGE) 1000 MG tablet TAKE 1 TABLET BY MOUTH TWICE A DAY WITH MEALS 180 tablet 0  . Multiple Vitamin (MULTIVITAMIN) tablet Take 1 tablet by mouth daily.    Marland Kitchen OZEMPIC, 1 MG/DOSE, 2 MG/1.5ML SOPN INJECT 1 MG INTO THE SKIN  WEEKLY 9 mL 1  . OZEMPIC, 1 MG/DOSE, 4 MG/3ML SOPN INJECT 1 MG INTO THE SKIN  WEEKLY 9 mL 0  . sildenafil (VIAGRA) 100 MG tablet As needed to maintain an erection 10 tablet 0  . simvastatin (ZOCOR) 40 MG tablet Take 1 tablet (40 mg total) by mouth daily. 90 tablet 1  . spironolactone (ALDACTONE) 25 MG tablet Take 1 tablet (25 mg total) by mouth daily. 90 tablet 2   No current facility-administered medications on file prior to visit.   No Known Allergies Family History  Problem Relation Age of Onset  . Diabetes Father   . Heart attack Father 30        Died age 70 MI   PE: There were no vitals taken for this visit. Wt Readings from Last 3 Encounters:  03/11/21 237 lb (107.5 kg)  12/10/20 239 lb (108.4 kg)  06/18/20 233 lb 4 oz (105.8 kg)   Constitutional: overweight, in NAD Eyes: PERRLA, EOMI, no exophthalmos ENT: moist mucous membranes, no thyromegaly, no cervical lymphadenopathy Cardiovascular: RRR, No MRG Respiratory: CTA B Gastrointestinal: abdomen soft, NT, ND, BS+ Musculoskeletal: no deformities, strength intact in all 4 Skin: moist, warm, no rashes Neurological: no tremor with outstretched hands, DTR normal in all 4  ASSESSMENT: 1. DM2, insulin-dependent, uncontrolled, with long-term complications - CHF - PN - ED  DOT approved insulin use for him in the past.  2. HL  3. Obesity class I  PLAN:  1. Patient with longstanding, uncontrolled, type 2 diabetes, on oral antidiabetic regimen with Metformin, sulfonylurea and also long-acting insulin and weekly GLP-1 receptor agonist.  At last visit, sugars are better after dinner but they were still high in the morning as he was eating after 8 PM.  He was working on reducing this.  However, I did advise him to increase Lantus to hopefully improve the morning sugars.  At that time, he inquired about possibly starting Metformin but I encouraged him to continue.  - -Since she does have a history of CHF and also obesity, he would be a good candidate for an SGLT2 inhibitor.  - I suggested to:  Patient Instructions  Please continue: - Metformin 1000 mg 2x a day, with meals - Glipizide 20 mg 2x a day before meals - Ozempic 1 mg weekly - Lantus 18 units daily   Please come back for a follow-up appointment in 3 months.  - we checked his HbA1c: 7%  - advised to check sugars at different times of the day - 1x a day, rotating check times - advised for yearly eye exams >> he is UTD - return to clinic in 3 months  2. HL -Reviewed latest lipid panel from last month: Fractions at  goal: Lab Results  Component Value Date   CHOL 156 03/11/2021   HDL 68 03/11/2021   LDLCALC 72 03/11/2021   TRIG 83 03/11/2021   CHOLHDL  2.3 03/11/2021  -We will continue Zocor 40 mg daily-he has no side effects from it  3. Obesity class I -We will continue Ozempic which should also help with weight loss -His weight was stable at last visit   Philemon Kingdom, MD PhD [facility] Endocr[facility]

## 2021-03-27 ENCOUNTER — Telehealth: Payer: Self-pay | Admitting: Internal Medicine

## 2021-03-27 NOTE — Telephone Encounter (Signed)
Patient dismissed from [facility] by Philemon Kingdom, MD, effective 03/22/21. Dismissal Letter sent out by 1st class mail. KLM

## 2021-05-09 ENCOUNTER — Other Ambulatory Visit: Payer: Self-pay

## 2021-05-09 MED ORDER — CARVEDILOL 12.5 MG PO TABS: 12.5000 mg | ORAL_TABLET | Freq: Two times a day (BID) | ORAL | 0 refills | Status: DC

## 2021-05-09 NOTE — Telephone Encounter (Signed)
Refill sent for Coreg 12.5 mg take one tablet twice daily.  Sent a message to scheduling to make a follow up appointment for further refills.

## 2021-05-10 ENCOUNTER — Encounter: Payer: Self-pay | Admitting: Cardiology

## 2021-05-10 ENCOUNTER — Other Ambulatory Visit: Payer: Self-pay

## 2021-05-10 ENCOUNTER — Ambulatory Visit (INDEPENDENT_AMBULATORY_CARE_PROVIDER_SITE_OTHER): Payer: 59 | Admitting: Cardiology

## 2021-05-10 VITALS — BP 132/90 | HR 93 | Ht 69.0 in | Wt 236.0 lb

## 2021-05-10 DIAGNOSIS — E7800 Pure hypercholesterolemia, unspecified: Secondary | ICD-10-CM | POA: Diagnosis not present

## 2021-05-10 DIAGNOSIS — I10 Essential (primary) hypertension: Secondary | ICD-10-CM | POA: Diagnosis not present

## 2021-05-10 DIAGNOSIS — I428 Other cardiomyopathies: Secondary | ICD-10-CM

## 2021-05-10 MED ORDER — CARVEDILOL 25 MG PO TABS: 25.0000 mg | ORAL_TABLET | Freq: Two times a day (BID) | ORAL | 5 refills | Status: DC

## 2021-05-10 NOTE — Progress Notes (Signed)
Cardiology Office Note:    Date:  05/10/2021   ID:  Alexander Drop., DOB Jun 03, 1966, MRN [id number]  PCP:  Just, Laurita Quint, FNP (Inactive)  Cardiologist:  Kate Sable, MD  Electrophysiologist:  None   Referring MD: No ref. provider found   Chief Complaint  Patient presents with  . other    6 month follow up. Meds reviewed verbally with patient.      History of Present Illness:    Alexander More. is a 55 y.o. male with a hx of diabetes, nonischemic cardiomyopathy, initial EF 30 to 35%, (EF improved to 45-50% on echo 09/2020), hyperlipidemia, hypertension who presents for follow-up.    Being seen for nonischemic cardiomyopathy and medication titration.  Currently on Coreg 12.5 mg twice daily, losartan 100 mg daily, Aldactone 25 mg daily.  Denies chest pain, shortness of breath, edema.  Blood pressure controlled on current meds.  Compliant with medications.  He feels well overall, takes Lasix  every other day.  Blood pressure at home runs in the 937J to 696V systolic.  Prior notes Echo 04/03/2020, EF 30 to 35%. Left heart catheter 04/30/2020 no evidence of obstructive coronary artery disease Echo 09/21/2020 EF 45 to 50%.   Past Medical History:  Diagnosis Date  . Diabetes mellitus without complication ([place])   . HFrEF (heart failure with reduced ejection fraction) ([place]) 03/2020   Echo 03/2020 LVEF 30-35%  . Hyperlipidemia   . Hypertension     Past Surgical History:  Procedure Laterality Date  . I & D EXTREMITY Left 11/24/2017   Procedure: IRRIGATION AND DEBRIDEMENT LEFT THIGH ABSCESS;  Surgeon: Erroll Luna, MD;  Location: [facility];  Service: General;  Laterality: Left;  . ORIF HIP FRACTURE Right   . RIGHT/LEFT HEART CATH AND CORONARY ANGIOGRAPHY N/A 04/30/2020   Procedure: RIGHT/LEFT HEART CATH AND CORONARY ANGIOGRAPHY;  Surgeon: Wellington Hampshire, MD;  Location: [facility] [place] [facility];  Service: Cardiovascular;  Laterality: N/A;    Current Medications: Current Meds   Medication Sig  . albuterol (VENTOLIN HFA) 108 (90 Base) MCG/ACT inhaler Inhale 2 puffs into the lungs every 4 (four) hours as needed for wheezing or shortness of breath.   Marland Kitchen aspirin 81 MG tablet Take 1 tablet (81 mg total) by mouth every other day.  . clotrimazole (LOTRIMIN) 1 % cream Apply 1 application topically 2 (two) times daily.  . furosemide (LASIX) 20 MG tablet Take 1 tablet (20 mg total) by mouth daily.  Marland Kitchen glipiZIDE (GLUCOTROL) 10 MG tablet TAKE 2 TABLETS BY MOUTH  TWICE DAILY BEFORE MEALS  . insulin glargine (LANTUS SOLOSTAR) 100 UNIT/ML Solostar Pen Inject 18 units at bedtime.  . Insulin Pen Needle 32G X 4 MM MISC Use 1x a day  . losartan (COZAAR) 100 MG tablet Take 1 tablet (100 mg total) by mouth daily.  . metFORMIN (GLUCOPHAGE) 1000 MG tablet TAKE 1 TABLET BY MOUTH TWICE A DAY WITH MEALS  . Multiple Vitamin (MULTIVITAMIN) tablet Take 1 tablet by mouth daily.  Marland Kitchen OZEMPIC, 1 MG/DOSE, 4 MG/3ML SOPN INJECT 1 MG INTO THE SKIN  WEEKLY  . sildenafil (VIAGRA) 100 MG tablet As needed to maintain an erection  . simvastatin (ZOCOR) 40 MG tablet Take 1 tablet (40 mg total) by mouth daily.  Marland Kitchen spironolactone (ALDACTONE) 25 MG tablet Take 1 tablet (25 mg total) by mouth daily.  . [DISCONTINUED] carvedilol (COREG) 12.5 MG tablet Take 1 tablet (12.5 mg total) by mouth 2 (two) times daily. Patient needs to contact  our office for a follow up appointment.     Allergies:   Patient has no known allergies.   Social History   Socioeconomic History  . Marital status: Married    Spouse name: Thayer Headings  . Number of children: 1  . Years of education: college  . Highest education level: Not on file  Occupational History  . Occupation: Self employed  Tobacco Use  . Smoking status: Never Smoker  . Smokeless tobacco: Never Used  Substance and Sexual Activity  . Alcohol use: Yes    Alcohol/week: 2.0 standard drinks    Types: 2 Standard drinks or equivalent per week    Comment: social   . Drug use:  No  . Sexual activity: Yes    Partners: Female  Other Topics Concern  . Not on file  Social History Narrative   Lives with wife.     Social Determinants of Health   Financial Resource Strain: Not on file  Food Insecurity: Not on file  Transportation Needs: Not on file  Physical Activity: Not on file  Stress: Not on file  Social Connections: Not on file     Family History: The patient's family history includes Diabetes in his father; Heart attack (age of onset: 54) in his father.  ROS:   Please see the history of present illness.     All other systems reviewed and are negative.  EKGs/Labs/Other Studies Reviewed:    The following studies were reviewed today:  EKG:  EKG is  ordered today.  The ekg ordered today demonstrates normal sinus rhythm, heart rate 93  Recent Labs: 03/11/2021: ALT 37; BUN 19; Creatinine, Ser 1.01; Potassium 4.8; Sodium 138  Recent Lipid Panel    Component Value Date/Time   CHOL 156 03/11/2021 1112   TRIG 83 03/11/2021 1112   HDL 68 03/11/2021 1112   CHOLHDL 2.3 03/11/2021 1112   CHOLHDL 2.5 10/21/2016 1223   VLDL 31 (H) 10/21/2016 1223   LDLCALC 72 03/11/2021 1112    Physical Exam:    VS:  BP 132/90 (BP Location: Left Arm, Patient Position: Sitting, Cuff Size: Normal)   Pulse 93   Ht 5\' 9"  (1.753 m)   Wt 236 lb (107 kg)   SpO2 95%   BMI 34.85 kg/m     Wt Readings from Last 3 Encounters:  05/10/21 236 lb (107 kg)  03/11/21 237 lb (107.5 kg)  12/10/20 239 lb (108.4 kg)     GEN:  Well nourished, well developed in no acute distress HEENT: Normal NECK: No JVD; No carotid bruits LYMPHATICS: No lymphadenopathy CARDIAC: RRR, no murmurs, rubs, gallops RESPIRATORY:  Clear to auscultation without rales, wheezing or rhonchi  ABDOMEN: Soft, non-tender, non-distended MUSCULOSKELETAL:  No edema; No deformity  SKIN: Warm and dry NEUROLOGIC:  Alert and oriented x 3 PSYCHIATRIC:  Normal affect   ASSESSMENT:    1. NICM (nonischemic  cardiomyopathy) ([place])   2. Primary hypertension   3. Pure hypercholesterolemia    PLAN:    In order of problems listed above:  1. nonischemic cardiomyopathy, initial EF 30 to 35%, last EF 45 to 50%.  Patient describes NYHA class II symptoms.  Patient is euvolemic.  Increase Coreg to 25 mg twice daily, continue losartan 100 mg, spironolactone 25 mg, take Lasix as needed.  Repeat echo in 6 months. 2. History of hypertension, BP slightly elevated, increase Coreg as above.  Continue losartan, spironolactone. 3. History of hyperlipidemia, LDL at goal, continue statin.  Follow-up in 6 months  after echo   This note was generated in part or whole with voice recognition software. Voice recognition is usually quite accurate but there are transcription errors that can and very often do occur. I apologize for any typographical errors that were not detected and corrected.  Medication Adjustments/Labs and Tests Ordered: Current medicines are reviewed at length with the patient today.  Concerns regarding medicines are outlined above.  Orders Placed This Encounter  Procedures  . ECHOCARDIOGRAM COMPLETE   Meds ordered this encounter  Medications  . carvedilol (COREG) 25 MG tablet    Sig: Take 1 tablet (25 mg total) by mouth 2 (two) times daily.    Dispense:  60 tablet    Refill:  5    Patient Instructions  Medication Instructions:   Your physician has recommended you make the following change in your medication:   INCREASE your Coreg (Carvedilol) to 25 MG twice a day.  *If you need a refill on your cardiac medications before your next appointment, please call your pharmacy*   Lab Work: None ordered If you have labs (blood work) drawn today and your tests are completely normal, you will receive your results only by: Marland Kitchen MyChart Message (if you have MyChart) OR . A paper copy in the mail If you have any lab test that is abnormal or we need to change your treatment, we will call you to review  the results.   Testing/Procedures:  1.  Your physician has requested that you have an echocardiogram in 6 months. Echocardiography is a painless test that uses sound waves to create images of your heart. It provides your doctor with information about the size and shape of your heart and how well your heart's chambers and valves are working. This procedure takes approximately one hour. There are no restrictions for this procedure.   Follow-Up: At [facility], you and your health needs are our priority.  As part of our continuing mission to provide you with exceptional heart care, we have created designated Provider Care Teams.  These Care Teams include your primary Cardiologist (physician) and Advanced Practice Providers (APPs -  Physician Assistants and Nurse Practitioners) who all work together to provide you with the care you need, when you need it.  We recommend signing up for the patient portal called "MyChart".  Sign up information is provided on this After Visit Summary.  MyChart is used to connect with patients for Virtual Visits (Telemedicine).  Patients are able to view lab/test results, encounter notes, upcoming appointments, etc.  Non-urgent messages can be sent to your provider as well.   To learn more about what you can do with MyChart, go to [email or web address].    Your next appointment:   6 month(s)  The format for your next appointment:   In Person  Provider:   Kate Sable, MD   Other Instructions       Signed, Kate Sable, MD  05/10/2021 4:38 PM    [place]

## 2021-05-10 NOTE — Patient Instructions (Addendum)
Medication Instructions:   Your physician has recommended you make the following change in your medication:   INCREASE your Coreg (Carvedilol) to 25 MG twice a day.  *If you need a refill on your cardiac medications before your next appointment, please call your pharmacy*   Lab Work: None ordered If you have labs (blood work) drawn today and your tests are completely normal, you will receive your results only by: Marland Kitchen MyChart Message (if you have MyChart) OR . A paper copy in the mail If you have any lab test that is abnormal or we need to change your treatment, we will call you to review the results.   Testing/Procedures:  1.  Your physician has requested that you have an echocardiogram in 6 months. Echocardiography is a painless test that uses sound waves to create images of your heart. It provides your doctor with information about the size and shape of your heart and how well your heart's chambers and valves are working. This procedure takes approximately one hour. There are no restrictions for this procedure.   Follow-Up: At [facility], you and your health needs are our priority.  As part of our continuing mission to provide you with exceptional heart care, we have created designated Provider Care Teams.  These Care Teams include your primary Cardiologist (physician) and Advanced Practice Providers (APPs -  Physician Assistants and Nurse Practitioners) who all work together to provide you with the care you need, when you need it.  We recommend signing up for the patient portal called "MyChart".  Sign up information is provided on this After Visit Summary.  MyChart is used to connect with patients for Virtual Visits (Telemedicine).  Patients are able to view lab/test results, encounter notes, upcoming appointments, etc.  Non-urgent messages can be sent to your provider as well.   To learn more about what you can do with MyChart, go to [email or web address].    Your next  appointment:   6 month(s)  The format for your next appointment:   In Person  Provider:   Kate Sable, MD   Other Instructions

## 2021-05-13 ENCOUNTER — Other Ambulatory Visit: Payer: Self-pay

## 2021-05-13 DIAGNOSIS — I10 Essential (primary) hypertension: Secondary | ICD-10-CM

## 2021-05-13 MED ORDER — CARVEDILOL 25 MG PO TABS: 25.0000 mg | ORAL_TABLET | Freq: Two times a day (BID) | ORAL | 3 refills | Status: DC

## 2021-05-13 NOTE — Progress Notes (Signed)
Reordered patients medication for a 90 day supply for [employer]

## 2021-05-15 ENCOUNTER — Other Ambulatory Visit: Payer: Self-pay | Admitting: *Deleted

## 2021-05-15 DIAGNOSIS — I10 Essential (primary) hypertension: Secondary | ICD-10-CM

## 2021-05-15 MED ORDER — LOSARTAN POTASSIUM 100 MG PO TABS: 100.0000 mg | ORAL_TABLET | Freq: Every day | ORAL | 2 refills | Status: DC

## 2021-05-21 NOTE — Addendum Note (Signed)
Addended by: Janan Ridge on: 05/21/2021 01:48 PM   Modules accepted: Orders

## 2021-06-06 ENCOUNTER — Other Ambulatory Visit: Payer: Self-pay | Admitting: Internal Medicine

## 2021-07-08 ENCOUNTER — Other Ambulatory Visit: Payer: Self-pay | Admitting: Internal Medicine

## 2021-07-16 ENCOUNTER — Other Ambulatory Visit: Payer: Self-pay | Admitting: Internal Medicine

## 2021-07-17 ENCOUNTER — Other Ambulatory Visit: Payer: Self-pay

## 2021-07-17 MED ORDER — SIMVASTATIN 40 MG PO TABS: 40.0000 mg | ORAL_TABLET | Freq: Every day | ORAL | 1 refills | Status: DC

## 2021-07-22 ENCOUNTER — Other Ambulatory Visit: Payer: Self-pay | Admitting: Family Medicine

## 2021-07-22 DIAGNOSIS — E119 Type 2 diabetes mellitus without complications: Secondary | ICD-10-CM

## 2021-07-22 DIAGNOSIS — E785 Hyperlipidemia, unspecified: Secondary | ICD-10-CM

## 2021-07-22 MED ORDER — METFORMIN HCL 1000 MG PO TABS: ORAL_TABLET | ORAL | 0 refills | Status: DC

## 2021-07-22 NOTE — Telephone Encounter (Signed)
Patient is currently out of his metFORMIN (GLUCOPHAGE) 1000 MG tablet. His former pcp's office closed and he is unable to reach them for an appointment. Patient does not have an appointment with Laverna Peace until 9/26 to establish care. Please contact his wife at [facility] at [phone].

## 2021-07-22 NOTE — Telephone Encounter (Signed)
Patient has an up coming appt on 09/16/21 with NP to establish care his previous PCP has moved can refill Metformin until appt last A1c in chart 03/13/21 9.9 last GFR 87.

## 2021-08-07 ENCOUNTER — Encounter: Payer: Self-pay | Admitting: Adult Health

## 2021-08-08 ENCOUNTER — Other Ambulatory Visit: Payer: Self-pay

## 2021-08-08 ENCOUNTER — Ambulatory Visit
Admission: EM | Admit: 2021-08-08 | Discharge: 2021-08-08 | Disposition: A | Discharge status: Home / Self Care | Payer: 59 | Attending: Emergency Medicine | Admitting: Emergency Medicine

## 2021-08-08 DIAGNOSIS — I10 Essential (primary) hypertension: Secondary | ICD-10-CM | POA: Diagnosis not present

## 2021-08-08 DIAGNOSIS — E785 Hyperlipidemia, unspecified: Secondary | ICD-10-CM | POA: Diagnosis not present

## 2021-08-08 DIAGNOSIS — I428 Other cardiomyopathies: Secondary | ICD-10-CM

## 2021-08-08 DIAGNOSIS — E119 Type 2 diabetes mellitus without complications: Secondary | ICD-10-CM | POA: Diagnosis not present

## 2021-08-08 MED ORDER — INSULIN PEN NEEDLE 32G X 4 MM MISC: 0 refills | Status: DC

## 2021-08-08 MED ORDER — OZEMPIC (1 MG/DOSE) 4 MG/3ML ~~LOC~~ SOPN: PEN_INJECTOR | SUBCUTANEOUS | 0 refills | Status: DC

## 2021-08-08 MED ORDER — LANTUS SOLOSTAR 100 UNIT/ML ~~LOC~~ SOPN: PEN_INJECTOR | SUBCUTANEOUS | 0 refills | Status: DC

## 2021-08-08 MED ORDER — INSULIN PEN NEEDLE 32G X 4 MM MISC: 0 refills | Status: AC

## 2021-08-08 MED ORDER — METFORMIN HCL 1000 MG PO TABS
ORAL_TABLET | ORAL | 0 refills | Status: DC
Start: 2021-08-08 — End: 2021-09-19

## 2021-08-08 MED ORDER — FUROSEMIDE 20 MG PO TABS: 20.0000 mg | ORAL_TABLET | Freq: Every day | ORAL | 0 refills | Status: DC

## 2021-08-08 NOTE — ED Triage Notes (Signed)
Pt states his PCP appt was cancelled and rescheduled in a month. States needs his meds refilled.

## 2021-08-08 NOTE — ED Provider Notes (Signed)
[facility]    CSN: [id number] Arrival date & time: 08/08/21  0909      History   Chief Complaint Chief Complaint  Patient presents with   Medication Refill    HPI Alexander [name]. is a 55 y.o. male.   Patient presents requesting medication refill for metformin, Lantus, Ozempic, furosemide.  Had primary care appointment scheduled for the beginning of September in the office has rescheduled appointment for later time and has declined to refill medications until seen.  Last seen for diabetes and March 2022, upcoming appointment with 61-monthreevaluation.  Denies dizziness, lightheadedness, shortness of breath, chest pain or tightness, cough, wheezing, polydipsia, polyphagia, polyuria.  Checks blood sugar weekly per MD request, sugars range in the 200s.  Last A1c in chart 9.9.   Past Medical History:  Diagnosis Date   Diabetes mellitus without complication ([facility]    [place]FrEF (heart failure with reduced ejection fraction) ([facility] 03/2020   Echo 03/2020 LVEF 30-35%   Hyperlipidemia    Hypertension     Patient Active Problem List   Diagnosis Date Noted   Chronic combined systolic and diastolic congestive heart failure (HCC)    Class 1 obesity with serious comorbidity and body mass index (BMI) of 34.0 to 34.9 in adult 11/04/2019   Pure hypercholesterolemia 11/04/2019   Uncontrolled type 2 diabetes mellitus with peripheral neuropathy ([facility] 08/16/2018   Abscess of left thigh 11/23/2017   Abnormal finding on MRI of brain 04/21/2014   Impotence of organic origin 09/24/2013   HTN (hypertension) 12/17/2012   Hyperlipidemia 12/17/2012    Past Surgical History:  Procedure Laterality Date   I & D EXTREMITY Left 11/24/2017   Procedure: IRRIGATION AND DEBRIDEMENT LEFT THIGH ABSCESS;  Surgeon: CErroll Luna MD;  [facility]  Service: General;  Laterality: Left;   ORIF HIP FRACTURE Right    RIGHT/LEFT HEART CATH AND CORONARY ANGIOGRAPHY N/A 04/30/2020   Procedure: RIGHT/LEFT  HEART CATH AND CORONARY ANGIOGRAPHY;  Surgeon: AWellington Hampshire MD;  [facility];  Service: Cardiovascular;  Laterality: N/A;       Home Medications    Prior to Admission medications   Medication Sig Start Date End Date Taking? Authorizing Provider  albuterol (VENTOLIN HFA) 108 (90 Base) MCG/ACT inhaler Inhale 2 puffs into the lungs every 4 (four) hours as needed for wheezing or shortness of breath.  01/15/20   [provider]  aspirin 81 MG tablet Take 1 tablet (81 mg total) by mouth every other day. 05/28/18   McVey, EGelene Mink PA-C  carvedilol (COREG) 25 MG tablet Take 1 tablet (25 mg total) by mouth 2 (two) times daily. 05/13/21 08/11/21  AKate Sable MD  clotrimazole (LOTRIMIN) 1 % cream Apply 1 application topically 2 (two) times daily. 08/02/20   SDaleen Squibb MD  furosemide (LASIX) 20 MG tablet Take 1 tablet (20 mg total) by mouth daily. 08/08/21   Son Barkan R, NP  glipiZIDE (GLUCOTROL) 10 MG tablet TAKE 2 TABLETS BY MOUTH  TWICE DAILY BEFORE MEALS 02/18/21   Just, KLaurita Quint FNP  insulin glargine (LANTUS SOLOSTAR) 100 UNIT/ML Solostar Pen Inject 18 units at bedtime. 08/08/21   WHans Eden NP  Insulin Pen Needle 32G X 4 MM MISC Use 1x a day 08/08/21   WHans Eden NP  losartan (COZAAR) 100 MG tablet Take 1 tablet (100 mg total) by mouth daily. 05/15/21   AKate Sable MD  metFORMIN (GLUCOPHAGE) 1000 MG tablet TAKE 1  TABLET BY MOUTH TWICE A DAY WITH MEALS 08/08/21   Hans Eden, NP  Multiple Vitamin (MULTIVITAMIN) tablet Take 1 tablet by mouth daily.    [provider]  Semaglutide, 1 MG/DOSE, (OZEMPIC, 1 MG/DOSE,) 4 MG/3ML SOPN INJECT 1 MG INTO THE SKIN  WEEKLY 08/08/21   Hans Eden, NP  sildenafil (VIAGRA) 100 MG tablet As needed to maintain an erection 12/10/20   Just, Laurita Quint, FNP  simvastatin (ZOCOR) 40 MG tablet Take 1 tablet (40 mg total) by mouth daily. 07/17/21   Kate Sable, MD   spironolactone (ALDACTONE) 25 MG tablet Take 1 tablet (25 mg total) by mouth daily. 01/09/21   Kate Sable, MD    Family History Family History  Problem Relation Age of Onset   Diabetes Father    Heart attack Father 61       Died age 31 MI    Social History Social History   Tobacco Use   Smoking status: Never   Smokeless tobacco: Never  Substance Use Topics   Alcohol use: Yes    Alcohol/week: 2.0 standard drinks    Types: 2 Standard drinks or equivalent per week    Comment: social    Drug use: No     Allergies   Patient has no known allergies.   Review of Systems Review of Systems  Constitutional: Negative.   Respiratory: Negative.    Cardiovascular: Negative.   Genitourinary: Negative.   Skin: Negative.   Neurological: Negative.     Physical Exam Triage Vital Signs ED Triage Vitals  Enc Vitals Group     BP 08/08/21 0924 117/75     Pulse Rate 08/08/21 0924 (!) 102     Resp 08/08/21 0924 18     Temp 08/08/21 0924 98.3 F (36.8 C)     Temp Source 08/08/21 0924 Oral     SpO2 08/08/21 0924 96 %     Weight --      Height --      Head Circumference --      Peak Flow --      Pain Score 08/08/21 0925 0     Pain Loc --      Pain Edu? --      Excl. in [place]? --    No data found.  Updated Vital Signs BP 117/75 (BP Location: Left Arm)   Pulse (!) 102   Temp 98.3 F (36.8 C) (Oral)   Resp 18   SpO2 96%   Visual Acuity Right Eye Distance:   Left Eye Distance:   Bilateral Distance:    Right Eye Near:   Left Eye Near:    Bilateral Near:     Physical Exam Constitutional:      Appearance: Normal appearance. He is normal weight.  HENT:     Head: Normocephalic.  Eyes:     Extraocular Movements: Extraocular movements intact.  Cardiovascular:     Rate and Rhythm: Normal rate and regular rhythm.     Pulses: Normal pulses.     Heart sounds: Normal heart sounds.  Pulmonary:     Effort: Pulmonary effort is normal.     Breath sounds: Normal breath  sounds.  Skin:    General: Skin is warm and dry.  Neurological:     Mental Status: He is alert and oriented to person, place, and time. Mental status is at baseline.  Psychiatric:        Mood and Affect: Mood normal.  Behavior: Behavior normal.     UC Treatments / Results  Labs (all labs ordered are listed, but only abnormal results are displayed) Labs Reviewed - No data to display  EKG   Radiology No results found.  Procedures Procedures (including critical care time)  Medications Ordered in UC Medications - No data to display  Initial Impression / Assessment and Plan / UC Course  I have reviewed the triage vital signs and the nursing notes.  Pertinent labs & imaging results that were available during my care of the patient were reviewed by me and considered in my medical decision making (see chart for details).   Type 2 diabetes without complication, with long-term insulin use Nonischemic cardiomyopathy Hyperlipidemia Essential hypertension  1.  Medications refilled for 2 to 26-monthsupply, all lab work and cardiology and endocrinology visits reviewed from the last year, chronic illnesses stable requiring reevaluation 2.  Encourage patient to follow-up with primary physician for reevaluation 3.  Patient wanting to change doctors, primary care referral placed  Final Clinical Impressions(s) / UC Diagnoses   Final diagnoses:  Type 2 diabetes mellitus without complication, without long-term current use of insulin ([facility]  Hyperlipidemia, unspecified hyperlipidemia type  NICM (nonischemic cardiomyopathy) ([facility]  Essential hypertension     Discharge Instructions      Medication has been refilled for a 292-montho 3-64-monthpply with the exception of Ozempic which was given as a year supply  Continue to take medications as prescribed  Please contact primary care office to determine when appointment was rescheduled so that all your lab work may be completed and  medications reevaluated for effectiveness  A primary care referral was placed for you, someone we will reach out to help you find a doctor within your area    ED Prescriptions     Medication Sig Dispense Auth. Provider   Semaglutide, 1 MG/DOSE, (OZEMPIC, 1 MG/DOSE,) 4 MG/3ML SOPN INJECT 1 MG INTO THE SKIN  WEEKLY 9 mL Nyara Capell R, NP   metFORMIN (GLUCOPHAGE) 1000 MG tablet TAKE 1 TABLET BY MOUTH TWICE A DAY WITH MEALS 60 tablet Jihan Rudy R, NP   insulin glargine (LANTUS SOLOSTAR) 100 UNIT/ML Solostar Pen Inject 18 units at bedtime. 15 mL Ndeye Tenorio R, NP   Insulin Pen Needle 32G X 4 MM MISC  (Status: Discontinued) Use 1x a day 100 each Caprina Wussow R, NP   furosemide (LASIX) 20 MG tablet Take 1 tablet (20 mg total) by mouth daily. 90 tablet Kody Vigil R, NP   Insulin Pen Needle 32G X 4 MM MISC Use 1x a day 100 each Jeaninne Lodico, AdrLeitha SchullerP      PDMP not reviewed this encounter.   WhiHans EdenP 08/08/21 1009

## 2021-08-08 NOTE — Discharge Instructions (Addendum)
Medication has been refilled for a 20-monthto 342-monthupply with the exception of Ozempic which was given as a year supply  Continue to take medications as prescribed  Please contact primary care office to determine when appointment was rescheduled so that all your lab work may be completed and medications reevaluated for effectiveness  A primary care referral was placed for you, someone we will reach out to help you find a doctor within your area

## 2021-08-12 ENCOUNTER — Ambulatory Visit: Payer: 59 | Admitting: Adult Health

## 2021-08-19 ENCOUNTER — Ambulatory Visit (AMBULATORY_SURGERY_CENTER): Payer: 59 | Admitting: *Deleted

## 2021-08-19 ENCOUNTER — Other Ambulatory Visit: Payer: Self-pay

## 2021-08-19 VITALS — Ht 69.0 in | Wt 228.0 lb

## 2021-08-19 DIAGNOSIS — Z86010 Personal history of colonic polyps: Secondary | ICD-10-CM

## 2021-08-19 MED ORDER — PLENVU 140 G PO SOLR: 1.0000 | ORAL | 0 refills | Status: DC

## 2021-08-19 NOTE — Progress Notes (Signed)
Pt verified name, DOB, address and insurance during PV today.  Pt mailed instruction packet of Emmi video, copy of consent form to read and not return, and instructions. Plenvu  coupon mailed in packet. PV completed over the phone.  Pt encouraged to call with questions or issues.  My Chart instructions to pt as well    No egg or soy allergy known to patient  No issues with past sedation with any surgeries or procedures Patient denies ever being told they had issues or difficulty with intubation  No FH of Malignant Hyperthermia No diet pills per patient No home 02 use per patient  No blood thinners per patient  Pt denies issues with constipation  No A fib or A flutter  EMMI video to pt or via [place] 19 guidelines implemented in [place] today with Pt and RN   Pt is fully vaccinated  for Covid   Plenvu  Coupon given to pt in PV today , Code to Pharmacy and  NO PA's for preps discussed with pt In PV today  Discussed with pt there will be an out-of-pocket cost for prep and that varies from $0 to 70 +  dollars   Due to the COVID-19 pandemic we are asking patients to follow certain guidelines.  Pt aware of COVID protocols and [facility] guidelines

## 2021-08-20 ENCOUNTER — Encounter: Payer: Self-pay | Admitting: Gastroenterology

## 2021-09-02 ENCOUNTER — Encounter: Payer: Self-pay | Admitting: Gastroenterology

## 2021-09-02 ENCOUNTER — Other Ambulatory Visit: Payer: Self-pay

## 2021-09-02 ENCOUNTER — Ambulatory Visit (AMBULATORY_SURGERY_CENTER): Payer: 59 | Admitting: Gastroenterology

## 2021-09-02 VITALS — BP 120/84 | HR 71 | Temp 96.4°F | Resp 10 | Ht 69.0 in | Wt 228.0 lb

## 2021-09-02 DIAGNOSIS — D122 Benign neoplasm of ascending colon: Secondary | ICD-10-CM

## 2021-09-02 DIAGNOSIS — D120 Benign neoplasm of cecum: Secondary | ICD-10-CM

## 2021-09-02 DIAGNOSIS — Z1211 Encounter for screening for malignant neoplasm of colon: Secondary | ICD-10-CM | POA: Diagnosis present

## 2021-09-02 DIAGNOSIS — D123 Benign neoplasm of transverse colon: Secondary | ICD-10-CM

## 2021-09-02 MED ORDER — SODIUM CHLORIDE 0.9 % IV SOLN: 500.0000 mL | Freq: Once | INTRAVENOUS | Status: DC

## 2021-09-02 NOTE — Patient Instructions (Signed)
Discharge instructions given. Handouts on polyps and diverticulosis. Resume previous medications. YOU HAD AN ENDOSCOPIC PROCEDURE TODAY AT THE [facility]:   Refer to the procedure report that was given to you for any specific questions about what was found during the examination.  If the procedure report does not answer your questions, please call your gastroenterologist to clarify.  If you requested that your care partner not be given the details of your procedure findings, then the procedure report has been included in a sealed envelope for you to review at your convenience later.  YOU SHOULD EXPECT: Some feelings of bloating in the abdomen. Passage of more gas than usual.  Walking can help get rid of the air that was put into your GI tract during the procedure and reduce the bloating. If you had a lower endoscopy (such as a colonoscopy or flexible sigmoidoscopy) you may notice spotting of blood in your stool or on the toilet paper. If you underwent a bowel prep for your procedure, you may not have a normal bowel movement for a few days.  Please Note:  You might notice some irritation and congestion in your nose or some drainage.  This is from the oxygen used during your procedure.  There is no need for concern and it should clear up in a day or so.  SYMPTOMS TO REPORT IMMEDIATELY:   Following lower endoscopy (colonoscopy or flexible sigmoidoscopy):  Excessive amounts of blood in the stool  Significant tenderness or worsening of abdominal pains  Swelling of the abdomen that is new, acute  Fever of 100F or higher   For urgent or emergent issues, a gastroenterologist can be reached at any hour by calling [phone]. Do not use MyChart messaging for urgent concerns.    DIET:  We do recommend a small meal at first, but then you may proceed to your regular diet.  Drink plenty of fluids but you should avoid alcoholic beverages for 24 hours.  ACTIVITY:  You should plan to take  it easy for the rest of today and you should NOT DRIVE or use heavy machinery until tomorrow (because of the sedation medicines used during the test).    FOLLOW UP: Our staff will call the number listed on your records 48-72 hours following your procedure to check on you and address any questions or concerns that you may have regarding the information given to you following your procedure. If we do not reach you, we will leave a message.  We will attempt to reach you two times.  During this call, we will ask if you have developed any symptoms of COVID 19. If you develop any symptoms (ie: fever, flu-like symptoms, shortness of breath, cough etc.) before then, please call [phone].  If you test positive for Covid 19 in the 2 weeks post procedure, please call and report this information to us.    If any biopsies were taken you will be contacted by phone or by letter within the next 1-3 weeks.  Please call us at [phone] if you have not heard about the biopsies in 3 weeks.    SIGNATURES/CONFIDENTIALITY: You and/or your care partner have signed paperwork which will be entered into your electronic medical record.  These signatures attest to the fact that that the information above on your After Visit Summary has been reviewed and is understood.  Full responsibility of the confidentiality of this discharge information lies with you and/or your care-partner. 

## 2021-09-02 NOTE — Progress Notes (Signed)
History and Physical:  This patient presents for endoscopic testing for: Encounter Diagnosis  Name Primary?   Special screening for malignant neoplasms, colon Yes  Clinical Hx NICM noted.  04/2021 Cardiology note reviewed, last echo report reviewed 09/2020 Patient without complaints today Patient had a colonoscopy approx 2008 (Dr Lajoyce Corners?).  Cannot recall indication , believes no polyps found.    Past Medical History: Past Medical History:  Diagnosis Date   Anemia    Blood transfusion without reported diagnosis    years ago per pt   CHF (congestive heart failure) ([place])    Diabetes mellitus without complication ([place])    GERD (gastroesophageal reflux disease)    occ TUMS   HFrEF (heart failure with reduced ejection fraction) ([place]) 03/2020   Echo 03/2020 LVEF 30-35%-- 09-21-2020- 45 50% EF   Hyperlipidemia    Hypertension      Past Surgical History: Past Surgical History:  Procedure Laterality Date   COLONOSCOPY  04/19/2007   Dr Lajoyce Corners - int. hems   I & D EXTREMITY Left 11/24/2017   Procedure: IRRIGATION AND DEBRIDEMENT LEFT THIGH ABSCESS;  Surgeon: Erroll Luna, MD;  Location: [facility];  Service: General;  Laterality: Left;   ORIF HIP FRACTURE Right 2003   from [place] accident   RIGHT/LEFT [place] N/A 04/30/2020   Procedure: RIGHT/LEFT HEART CATH AND CORONARY ANGIOGRAPHY;  Surgeon: Wellington Hampshire, MD;  Location: [facility];  Service: Cardiovascular;  Laterality: N/A;    Allergies: No Known Allergies  Outpatient Meds: Current Outpatient Medications  Medication Sig Dispense Refill   aspirin 81 MG tablet Take 1 tablet (81 mg total) by mouth every other day. 30 tablet 11   furosemide (LASIX) 20 MG tablet Take 1 tablet (20 mg total) by mouth daily. 90 tablet 0   glipiZIDE (GLUCOTROL) 10 MG tablet TAKE 2 TABLETS BY MOUTH  TWICE DAILY BEFORE MEALS 360 tablet 1   insulin glargine (LANTUS SOLOSTAR) 100 UNIT/ML Solostar Pen Inject 18 units at  bedtime. 15 mL 0   losartan (COZAAR) 100 MG tablet Take 1 tablet (100 mg total) by mouth daily. 90 tablet 2   metFORMIN (GLUCOPHAGE) 1000 MG tablet TAKE 1 TABLET BY MOUTH TWICE A DAY WITH MEALS 60 tablet 0   Multiple Vitamin (MULTIVITAMIN) tablet Take 1 tablet by mouth daily.     Semaglutide, 1 MG/DOSE, (OZEMPIC, 1 MG/DOSE,) 4 MG/3ML SOPN INJECT 1 MG INTO THE SKIN  WEEKLY 9 mL 0   simvastatin (ZOCOR) 40 MG tablet Take 1 tablet (40 mg total) by mouth daily. 90 tablet 1   spironolactone (ALDACTONE) 25 MG tablet Take 1 tablet (25 mg total) by mouth daily. 90 tablet 2   albuterol (VENTOLIN HFA) 108 (90 Base) MCG/ACT inhaler Inhale 2 puffs into the lungs every 4 (four) hours as needed for wheezing or shortness of breath.  (Patient not taking: Reported on 09/02/2021)     carvedilol (COREG) 25 MG tablet Take 1 tablet (25 mg total) by mouth 2 (two) times daily. 180 tablet 3   clotrimazole (LOTRIMIN) 1 % cream Apply 1 application topically 2 (two) times daily. (Patient not taking: Reported on 09/02/2021) 30 g 0   Insulin Pen Needle 32G X 4 MM MISC Use 1x a day (Patient not taking: Reported on 09/02/2021) 100 each 0   Current Facility-Administered Medications  Medication Dose Route Frequency Provider Last Rate Last Admin   0.9 %  sodium chloride infusion  500 mL Intravenous Once Nelida Meuse  III, MD          ___________________________________________________________________ Objective   Exam:  BP 105/63   Pulse 82   Temp (!) 96.4 F (35.8 C)   Ht '5\' 9"'$  (1.753 m)   Wt 228 lb (103.4 kg)   SpO2 98%   BMI 33.67 kg/m   CV: RRR without murmur, S1/S2 Resp: clear to auscultation bilaterally, normal RR and effort noted GI: soft, no tenderness, with active bowel sounds.   Assessment: Encounter Diagnosis  Name Primary?   Special screening for malignant neoplasms, colon Yes     Plan: Colonoscopy  The benefits and risks of the planned procedure were described in detail with the patient or  (when appropriate) their health care proxy.  Risks were outlined as including, but not limited to, bleeding, infection, perforation, adverse medication reaction leading to cardiac or pulmonary decompensation, pancreatitis (if ERCP).  The limitation of incomplete mucosal visualization was also discussed.  No guarantees or warranties were given.    The patient is appropriate for an endoscopic procedure in the ambulatory setting.   - Wilfrid Lund, MD

## 2021-09-02 NOTE — Op Note (Signed)
[place] [facility] Patient Name: Alexander [name] Procedure Date: 09/02/2021 10:41 AM MRN: [id number] Endoscopist: Mallie Mussel L. Loletha Carrow , MD Age: 55 Referring MD:  Date of Birth: 03/29/1966 Gender: Male Account #: [id number] Procedure:                Colonoscopy Indications:              Screening for colorectal malignant neoplasm                           No polyps last colonoscopy 2008 Medicines:                Monitored Anesthesia Care Procedure:                Pre-Anesthesia Assessment:                           - Prior to the procedure, a History and Physical                            was performed, and patient medications and                            allergies were reviewed. The patient's tolerance of                            previous anesthesia was also reviewed. The risks                            and benefits of the procedure and the sedation                            options and risks were discussed with the patient.                            All questions were answered, and informed consent                            was obtained. Prior Anticoagulants: The patient has                            taken no previous anticoagulant or antiplatelet                            agents. ASA Grade Assessment: III - A patient with                            severe systemic disease. After reviewing the risks                            and benefits, the patient was deemed in                            satisfactory condition to undergo the procedure.  After obtaining informed consent, the colonoscope                            was passed under direct vision. Throughout the                            procedure, the patient's blood pressure, pulse, and                            oxygen saturations were monitored continuously. The                            CF [id number] was introduced through the anus                            and advanced to the the cecum,  identified by                            appendiceal orifice and ileocecal valve. The                            colonoscopy was performed without difficulty. The                            patient tolerated the procedure well. The quality                            of the bowel preparation was excellent. The                            ileocecal valve, appendiceal orifice, and rectum                            were photographed. Scope In: 11:00:03 AM Scope Out: 11:20:13 AM Scope Withdrawal Time: 0 hours 14 minutes 42 seconds  Total Procedure Duration: 0 hours 20 minutes 10 seconds  Findings:                 The perianal and digital rectal examinations were                            normal.                           Two sessile polyps were found in the distal                            ascending colon and cecum. The polyps were 3 to 6                            mm in size. These polyps were removed with a cold                            snare. Resection and retrieval were complete.  A 12-14 mm polyp was found in the proximal                            transverse colon. The polyp was semi-pedunculated.                            The polyp was removed with a hot snare. Resection                            and retrieval were complete.                           A few diverticula were found in the left colon.                           The exam was otherwise without abnormality on                            direct and retroflexion views. Complications:            No immediate complications. Estimated Blood Loss:     Estimated blood loss was minimal. Impression:               - Two 3 to 6 mm polyps in the distal ascending                            colon and in the cecum, removed with a cold snare.                            Resected and retrieved.                           - One 12-14 mm polyp in the proximal transverse                            colon, removed with a  hot snare. Resected and                            retrieved.                           - Diverticulosis in the left colon.                           - The examination was otherwise normal on direct                            and retroflexion views. Recommendation:           - Patient has a contact number available for                            emergencies. The signs and symptoms of potential  delayed complications were discussed with the                            patient. Return to normal activities tomorrow.                            Written discharge instructions were provided to the                            patient.                           - Resume previous diet.                           - Continue present medications.                           - Await pathology results.                           - Repeat colonoscopy is recommended for                            surveillance. The colonoscopy date will be                            determined after pathology results from today's                            exam become available for review. Alexander [name] L. Loletha Carrow, MD 09/02/2021 11:25:50 AM This report has been signed electronically.

## 2021-09-02 NOTE — Progress Notes (Signed)
Pt. Reports no change in his medical or surgical history since his pre-visit 08/19/2021.

## 2021-09-02 NOTE — Progress Notes (Signed)
PT taken to PACU. Monitors in place. VSS. Report given to RN. 

## 2021-09-02 NOTE — Progress Notes (Signed)
Called to room to assist during endoscopic procedure.  Patient ID and intended procedure confirmed with present staff. Received instructions for my participation in the procedure from the performing physician.  

## 2021-09-04 ENCOUNTER — Telehealth: Payer: Self-pay

## 2021-09-04 NOTE — Telephone Encounter (Signed)
Attempted to reach patient for post-procedure f/u call. No answer. Left message that staff will make another attempt to reach him later today and for him to please not hesitate to call us if he has any questions/concerns regarding his care. 

## 2021-09-04 NOTE — Telephone Encounter (Signed)
  Follow up Call-  Call back number 09/02/2021  Post procedure Call Back phone  # [phone]  Permission to leave phone message Yes  Some recent data might be hidden     Patient questions:  Do you have a fever, pain , or abdominal swelling? No. Pain Score  0 *  Have you tolerated food without any problems? Yes.    Have you been able to return to your normal activities? Yes.    Do you have any questions about your discharge instructions: Diet   No. Medications  No. Follow up visit  No.  Do you have questions or concerns about your Care? No.  Actions: * If pain score is 4 or above: No action needed, pain <4. Have you developed a fever since your procedure? no  2.   Have you had an respiratory symptoms (SOB or cough) since your procedure? no  3.   Have you tested positive for COVID 19 since your procedure no  4.   Have you had any family members/close contacts diagnosed with the COVID 19 since your procedure?  no   If yes to any of these questions please route to Joylene John, RN and Joella Prince, RN

## 2021-09-10 ENCOUNTER — Encounter: Payer: Self-pay | Admitting: Gastroenterology

## 2021-09-16 ENCOUNTER — Ambulatory Visit: Payer: 59 | Admitting: Adult Health

## 2021-09-19 ENCOUNTER — Other Ambulatory Visit: Payer: Self-pay

## 2021-09-19 ENCOUNTER — Ambulatory Visit (INDEPENDENT_AMBULATORY_CARE_PROVIDER_SITE_OTHER): Payer: 59 | Admitting: Nurse Practitioner

## 2021-09-19 VITALS — BP 98/60 | HR 94 | Temp 97.8°F | Resp 10 | Ht 69.0 in | Wt 236.5 lb

## 2021-09-19 DIAGNOSIS — Z125 Encounter for screening for malignant neoplasm of prostate: Secondary | ICD-10-CM

## 2021-09-19 DIAGNOSIS — L603 Nail dystrophy: Secondary | ICD-10-CM

## 2021-09-19 DIAGNOSIS — IMO0002 Reserved for concepts with insufficient information to code with codable children: Secondary | ICD-10-CM

## 2021-09-19 DIAGNOSIS — I10 Essential (primary) hypertension: Secondary | ICD-10-CM | POA: Diagnosis not present

## 2021-09-19 DIAGNOSIS — E1142 Type 2 diabetes mellitus with diabetic polyneuropathy: Secondary | ICD-10-CM | POA: Diagnosis not present

## 2021-09-19 DIAGNOSIS — E1165 Type 2 diabetes mellitus with hyperglycemia: Secondary | ICD-10-CM | POA: Diagnosis not present

## 2021-09-19 DIAGNOSIS — Z6834 Body mass index (BMI) 34.0-34.9, adult: Secondary | ICD-10-CM

## 2021-09-19 DIAGNOSIS — E785 Hyperlipidemia, unspecified: Secondary | ICD-10-CM

## 2021-09-19 DIAGNOSIS — I5042 Chronic combined systolic (congestive) and diastolic (congestive) heart failure: Secondary | ICD-10-CM

## 2021-09-19 DIAGNOSIS — E669 Obesity, unspecified: Secondary | ICD-10-CM

## 2021-09-19 LAB — MICROALBUMIN / CREATININE URINE RATIO
Creatinine,U: 277 mg/dL
Microalb Creat Ratio: 10 mg/g (ref 0.0–30.0)
Microalb, Ur: 27.7 mg/dL — ABNORMAL HIGH (ref 0.0–1.9)

## 2021-09-19 LAB — LIPID PANEL
Cholesterol: 120 mg/dL (ref 0–200)
HDL: 54.7 mg/dL (ref >39.00)
LDL Cholesterol: 50 mg/dL (ref 0–99)
NonHDL: 65.63
Total CHOL/HDL Ratio: 2
Triglycerides: 79 mg/dL (ref 0.0–149.0)
VLDL: 15.8 mg/dL (ref 0.0–40.0)

## 2021-09-19 LAB — CBC
HCT: 39.9 % (ref 39.0–52.0)
Hemoglobin: 13.5 g/dL (ref 13.0–17.0)
MCHC: 33.9 g/dL (ref 30.0–36.0)
MCV: 89.8 fl (ref 78.0–100.0)
Platelets: 240 10*3/uL (ref 150.0–400.0)
RBC: 4.45 Mil/uL (ref 4.22–5.81)
RDW: 13.7 % (ref 11.5–15.5)
WBC: 5.7 10*3/uL (ref 4.0–10.5)

## 2021-09-19 LAB — POCT GLYCOSYLATED HEMOGLOBIN (HGB A1C): Hemoglobin A1C: 9.4 % — AB (ref 4.0–5.6)

## 2021-09-19 LAB — COMPREHENSIVE METABOLIC PANEL
ALT: 16 U/L (ref 0–53)
AST: 15 U/L (ref 0–37)
Albumin: 4.1 g/dL (ref 3.5–5.2)
Alkaline Phosphatase: 48 U/L (ref 39–117)
BUN: 15 mg/dL (ref 6–23)
CO2: 30 mEq/L (ref 19–32)
Calcium: 9 mg/dL (ref 8.4–10.5)
Chloride: 104 mEq/L (ref 96–112)
Creatinine, Ser: 1.01 mg/dL (ref 0.40–1.50)
GFR: 83.13 mL/min (ref >60.00)
Glucose, Bld: 185 mg/dL — ABNORMAL HIGH (ref 70–99)
Potassium: 4.2 mEq/L (ref 3.5–5.1)
Sodium: 141 mEq/L (ref 135–145)
Total Bilirubin: 1.3 mg/dL — ABNORMAL HIGH (ref 0.2–1.2)
Total Protein: 6.7 g/dL (ref 6.0–8.3)

## 2021-09-19 LAB — PSA: PSA: 0.71 ng/mL (ref 0.10–4.00)

## 2021-09-19 MED ORDER — METFORMIN HCL 1000 MG PO TABS: ORAL_TABLET | ORAL | 0 refills | Status: DC

## 2021-09-19 MED ORDER — GLIPIZIDE 10 MG PO TABS: ORAL_TABLET | ORAL | 1 refills | Status: DC

## 2021-09-19 MED ORDER — SPIRONOLACTONE 25 MG PO TABS: 25.0000 mg | ORAL_TABLET | Freq: Every day | ORAL | 2 refills | Status: DC

## 2021-09-19 MED ORDER — LANTUS SOLOSTAR 100 UNIT/ML ~~LOC~~ SOPN: PEN_INJECTOR | SUBCUTANEOUS | 0 refills | Status: DC

## 2021-09-19 NOTE — Assessment & Plan Note (Signed)
Currently followed by cardiology.  Continue taking medication as prescribed by cardiology and following up as recommended by their clinic.

## 2021-09-19 NOTE — Assessment & Plan Note (Signed)
Patient drives a truck for living.  He does walk on a regular basis.  See information in HPI.  Continue working on lifestyle modifications.  We will continue working glycemic control to help with weight loss.

## 2021-09-19 NOTE — Assessment & Plan Note (Signed)
Patient does not check blood pressure at home very often states couple times a month when he is out and about in the drugstore.  Currently maintained on carvedilol 25 mg, losartan 100 mg, spironolactone 25 mg, and furosemide 20 mg.  Patient is followed by cardiology for heart failure.  Blood pressure within normal limits.  Continue taking medications as prescribed and listed above.

## 2021-09-19 NOTE — Patient Instructions (Signed)
Want you to check your sugar twice a day. Once first thing in the morning before you eat or drink and then once before bedtime.  Going to start using 20 Units of the lantus Also on the glipizide 10mg  tablet twice a day not 2 tablets twice a day.

## 2021-09-19 NOTE — Assessment & Plan Note (Signed)
Patient not currently checking his sugars as often of the like.  Discussed with patient need him to check his sugars twice a day.  First thing in the morning fasting and before bedtime.  Patient only gives himself Lantus during the day which is fine.  We will be changing his glipizide from 20 mg twice daily to 10 mg twice daily and sedated shown above 20 mg as no more effective.  Discussed this with patient.  We will increase his Lantus from 18 units to 20 units daily.  Patient states he has the equipment at home in order to check his blood glucose as I requested.  We will continue other medications such as his Ozempic 1 mg once weekly, metformin 1000 mg twice daily.  A1c above goal today in office at 9.4% we will have patient follow-up in 3 months for recheck he is supposed to check his sugars twice daily and record them and send them to me via [place].

## 2021-09-19 NOTE — Assessment & Plan Note (Signed)
Currently maintained on the simvastatin 40 mg.  Patient is tolerating medication well.  Pending lipid panel in office.  Continue to simvastatin milligrams daily

## 2021-09-19 NOTE — Progress Notes (Signed)
New Patient Office Visit  Subjective:  Patient ID: Alexander Soderlund., male    DOB: [birth year]  Age: 55 y.o. MRN: [id number]  CC:  Chief Complaint  Patient presents with   Establish Care    Use to go to [facility]    Weight Gain    Noticed in the last few months. He is walking daily. Diet has not changed much   Toes problem    Some discoloration in toes noted. No pain    HPI Alexander [name]. presents for [facility]  HTN: Checks Bp about twice a month. DM2: Checks blood glucose at home approx 3 times a month. Denies any low blood glucose reading HF: Every 6 months  follow up with Cardiology. Dr Kate Sable HLD: Linus Mako daily 3 miles daily approx 1 plus hours. Eat 2-3 times daily. Avoids carbs when he can.  Soda twice a month, mostly water. Limits dessert. If he has dessert will usually share it with his wife.  Discoloration of toes: States approx 6-7 months. States toenail color is changing not skin color.   Past Medical History:  Diagnosis Date   Anemia    Blood transfusion without reported diagnosis    years ago per pt   CHF (congestive heart failure) ([place])    Diabetes mellitus without complication ([place])    GERD (gastroesophageal reflux disease)    occ TUMS   HFrEF (heart failure with reduced ejection fraction) ([place]) 03/2020   Echo 03/2020 LVEF 30-35%-- 09-21-2020- 45 50% EF   Hyperlipidemia    Hypertension     Past Surgical History:  Procedure Laterality Date   COLONOSCOPY  04/19/2007   Dr Lajoyce Corners - int. hems   I & D EXTREMITY Left 11/24/2017   Procedure: IRRIGATION AND DEBRIDEMENT LEFT THIGH ABSCESS;  Surgeon: Erroll Luna, MD;  Location: [facility];  Service: General;  Laterality: Left;   ORIF HIP FRACTURE Right 2003   from [place] accident   RIGHT/LEFT [place] N/A 04/30/2020   Procedure: RIGHT/LEFT HEART CATH AND CORONARY ANGIOGRAPHY;  Surgeon: Wellington Hampshire, MD;  Location: [facility];  Service: Cardiovascular;  Laterality: N/A;     Family History  Problem Relation Age of Onset   Diabetes Father    Heart attack Father 36       Died age 68 MI   Colon cancer Neg Hx    Colon polyps Neg Hx    Esophageal cancer Neg Hx    Rectal cancer Neg Hx    Stomach cancer Neg Hx     Social History   Socioeconomic History   Marital status: Married    Spouse name: Thayer Headings   Number of children: 1   Years of education: college   Highest education level: Not on file  Occupational History   Occupation: Self employed  Tobacco Use   Smoking status: Never   Smokeless tobacco: Never  Substance and Sexual Activity   Alcohol use: Yes    Alcohol/week: 2.0 standard drinks    Types: 2 Standard drinks or equivalent per week    Comment: social    Drug use: No   Sexual activity: Yes    Partners: Female  Other Topics Concern   Not on file  Social History Narrative   Lives with wife.     Social Determinants of Health   Financial Resource Strain: Not on file  Food Insecurity: Not on file  Transportation Needs: Not on file  Physical Activity: Not on  file  Stress: Not on file  Social Connections: Not on file  Intimate Partner Violence: Not on file    ROS Review of Systems  Constitutional:  Negative for chills and fever.  Respiratory:  Negative for shortness of breath.   Cardiovascular:  Negative for chest pain.  Gastrointestinal:  Negative for blood in stool, diarrhea, nausea and vomiting.  Genitourinary:  Negative for dysuria and hematuria.  Skin:  Negative for color change and rash.  Neurological:  Negative for dizziness, light-headedness and headaches.   Objective:   Today's Vitals: BP 98/60   Pulse 94   Temp 97.8 F (36.6 C)   Resp 10   Ht 5\' 9"  (1.753 m)   Wt 236 lb 8 oz (107.3 kg)   SpO2 96%   BMI 34.92 kg/m   Physical Exam Vitals and nursing note reviewed.  Constitutional:      Appearance: He is obese.  HENT:     Right Ear: Tympanic membrane, ear canal and external ear normal. There is no impacted  cerumen.     Left Ear: Tympanic membrane, ear canal and external ear normal. There is no impacted cerumen.     Mouth/Throat:     Mouth: Mucous membranes are moist.     Pharynx: Oropharynx is clear.  Eyes:     Extraocular Movements: Extraocular movements intact.     Pupils: Pupils are equal, round, and reactive to light.  Neck:     Thyroid: No thyroid mass, thyromegaly or thyroid tenderness.  Cardiovascular:     Rate and Rhythm: Normal rate and regular rhythm.     Pulses:          Dorsalis pedis pulses are 2+ on the right side and 2+ on the left side.       Posterior tibial pulses are 1+ on the right side and 1+ on the left side.  Pulmonary:     Effort: Pulmonary effort is normal.     Breath sounds: Normal breath sounds.  Abdominal:     General: Bowel sounds are normal.  Musculoskeletal:     Right lower leg: No edema.     Left lower leg: No edema.  Feet:     Right foot:     Protective Sensation: 8 sites tested.  8 sites sensed.     Skin integrity: Skin integrity normal.     Toenail Condition: Right toenails are abnormally thick.     Left foot:     Protective Sensation: 8 sites tested.  7 sites sensed.     Skin integrity: Skin integrity normal.     Toenail Condition: Left toenails are abnormally thick.     Comments: Monofilament exam performed Lymphadenopathy:     Cervical: No cervical adenopathy.  Skin:    General: Skin is warm.     Findings: No lesion or rash.  Neurological:     Mental Status: He is alert. Mental status is at baseline.     Motor: No weakness.     Gait: Gait normal.     Deep Tendon Reflexes: Reflexes normal.  Psychiatric:        Mood and Affect: Mood normal.        Behavior: Behavior normal.        Thought Content: Thought content normal.        Judgment: Judgment normal.    Assessment & Plan:   Problem List Items Addressed This Visit       Cardiovascular and Mediastinum   HTN (hypertension)  Patient does not check blood pressure at home very  often states couple times a month when he is out and about in the drugstore.  Currently maintained on carvedilol 25 mg, losartan 100 mg, spironolactone 25 mg, and furosemide 20 mg.  Patient is followed by cardiology for heart failure.  Blood pressure within normal limits.  Continue taking medications as prescribed and listed above.      Chronic combined systolic and diastolic congestive heart failure ([place])    Currently followed by cardiology.  Continue taking medication as prescribed by cardiology and following up as recommended by their clinic.        Endocrine   Uncontrolled type 2 diabetes mellitus with peripheral neuropathy ([place])    Patient not currently checking his sugars as often of the like.  Discussed with patient need him to check his sugars twice a day.  First thing in the morning fasting and before bedtime.  Patient only gives himself Lantus during the day which is fine.  We will be changing his glipizide from 20 mg twice daily to 10 mg twice daily and sedated shown above 20 mg as no more effective.  Discussed this with patient.  We will increase his Lantus from 18 units to 20 units daily.  Patient states he has the equipment at home in order to check his blood glucose as I requested.  We will continue other medications such as his Ozempic 1 mg once weekly, metformin 1000 mg twice daily.  A1c above goal today in office at 9.4% we will have patient follow-up in 3 months for recheck he is supposed to check his sugars twice daily and record them and send them to me via [place].      Relevant Medications   metFORMIN (GLUCOPHAGE) 1000 MG tablet   glipiZIDE (GLUCOTROL) 10 MG tablet   insulin glargine (LANTUS SOLOSTAR) 100 UNIT/ML Solostar Pen   Other Relevant Orders   POCT glycosylated hemoglobin (Hb A1C) (Completed)   CBC   Comprehensive metabolic panel   Microalbumin / creatinine urine ratio     Other   Hyperlipidemia    Currently maintained on the simvastatin 40 mg.  Patient is  tolerating medication well.  Pending lipid panel in office.  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  See information in HPI.  Continue working on lifestyle modifications.  We will continue working glycemic control to help with weight loss.      Relevant Medications   metFORMIN (GLUCOPHAGE) 1000 MG tablet   glipiZIDE (GLUCOTROL) 10 MG tablet   insulin glargine (LANTUS SOLOSTAR) 100 UNIT/ML Solostar Pen   Other Visit Diagnoses     Brittle nails    -  Primary   Essential hypertension       Relevant Orders   CBC   Comprehensive metabolic panel   Screening for prostate cancer       Relevant Orders   PSA       Outpatient Encounter Medications as of 09/19/2021  Medication Sig   albuterol (VENTOLIN HFA) 108 (90 Base) MCG/ACT inhaler Inhale 2 puffs into the lungs every 4 (four) hours as needed for wheezing or shortness of breath.   aspirin 81 MG tablet Take 1 tablet (81 mg total) by mouth  every other day.   carvedilol (COREG) 25 MG tablet Take 1 tablet (25 mg total) by mouth 2 (two) times daily.   clotrimazole (LOTRIMIN) 1 % cream Apply 1 application topically 2 (two) times daily.   furosemide (LASIX) 20 MG tablet Take 1 tablet (20 mg total) by mouth daily.   Insulin Pen Needle 32G X 4 MM MISC Use 1x a day   losartan (COZAAR) 100 MG tablet Take 1 tablet (100 mg total) by mouth daily.   Multiple Vitamin (MULTIVITAMIN) tablet Take 1 tablet by mouth daily.   Semaglutide, 1 MG/DOSE, (OZEMPIC, 1 MG/DOSE,) 4 MG/3ML SOPN INJECT 1 MG INTO THE SKIN  WEEKLY   simvastatin (ZOCOR) 40 MG tablet Take 1 tablet (40 mg total) by mouth daily.   spironolactone (ALDACTONE) 25 MG tablet Take 1 tablet (25 mg total) by mouth daily.   [DISCONTINUED] glipiZIDE (GLUCOTROL) 10 MG tablet TAKE 2 TABLETS BY MOUTH   TWICE DAILY BEFORE MEALS   [DISCONTINUED] insulin glargine (LANTUS SOLOSTAR) 100 UNIT/ML Solostar Pen Inject 18 units at bedtime.   [DISCONTINUED] metFORMIN (GLUCOPHAGE) 1000 MG tablet TAKE 1 TABLET BY MOUTH TWICE A DAY WITH MEALS   glipiZIDE (GLUCOTROL) 10 MG tablet TAKE 1 TABLET BY MOUTH  TWICE DAILY BEFORE MEALS   insulin glargine (LANTUS SOLOSTAR) 100 UNIT/ML Solostar Pen Inject 20 units at bedtime.   metFORMIN (GLUCOPHAGE) 1000 MG tablet TAKE 1 TABLET BY MOUTH TWICE A DAY WITH MEALS   No facility-administered encounter medications on file as of 09/19/2021.    Follow-up: Return in about 14 weeks (around 12/26/2021) for DM recheck.   This visit occurred during the SARS-CoV-2 public health emergency.  Safety protocols were in place, including screening questions prior to the visit, additional usage of staff PPE, and extensive cleaning of exam room while observing appropriate contact time as indicated for disinfecting solutions.   Romilda Garret, NP

## 2021-09-26 ENCOUNTER — Telehealth: Payer: Self-pay

## 2021-09-26 NOTE — Telephone Encounter (Signed)
Left message for patient to call me back. I wrote down that he saw an eye doctor with [facility] office but could not read what location I wrote down. Need to verify with patient which location as there are several in [place].

## 2021-09-26 NOTE — Telephone Encounter (Signed)
Noted. Thank you. Records requested 

## 2021-09-26 NOTE — Telephone Encounter (Signed)
Pt called back stating that he went to the [facility] location

## 2021-10-21 ENCOUNTER — Encounter: Payer: 59 | Admitting: Adult Health

## 2021-10-21 ENCOUNTER — Encounter: Payer: 59 | Admitting: Nurse Practitioner

## 2021-11-08 ENCOUNTER — Ambulatory Visit (INDEPENDENT_AMBULATORY_CARE_PROVIDER_SITE_OTHER): Payer: 59

## 2021-11-08 ENCOUNTER — Other Ambulatory Visit: Payer: Self-pay

## 2021-11-08 DIAGNOSIS — I428 Other cardiomyopathies: Secondary | ICD-10-CM | POA: Diagnosis not present

## 2021-11-08 LAB — ECHOCARDIOGRAM COMPLETE
AR max vel: 1.95 cm2
AV Area VTI: 2.17 cm2
AV Area mean vel: 1.88 cm2
AV Mean grad: 5 mmHg
AV Peak grad: 8.2 mmHg
Ao pk vel: 1.43 m/s
Area-P 1/2: 3.02 cm2
S' Lateral: 4.3 cm
Single Plane A4C EF: 47.1 %

## 2021-11-08 MED ORDER — PERFLUTREN LIPID MICROSPHERE
1.0000 mL | INTRAVENOUS | Status: AC | PRN
  Administered 2021-11-08: 2 mL via INTRAVENOUS

## 2021-11-11 ENCOUNTER — Telehealth: Payer: Self-pay | Admitting: *Deleted

## 2021-11-11 NOTE — Telephone Encounter (Signed)
Left voicemail message to call back for review of results.  

## 2021-11-11 NOTE — Telephone Encounter (Signed)
-----   Message from Kate Sable, MD sent at 11/08/2021  6:18 PM EST ----- Echocardiogram shows moderately reduced ejection fraction.  Continue medications as prescribed, may consider switching losartan to Entresto at follow-up visit.

## 2021-11-12 NOTE — Telephone Encounter (Signed)
Left voicemail message to call back for review of results. Left messages on both numbers listed for this patient.

## 2021-11-13 NOTE — Telephone Encounter (Signed)
The patient has been notified of the result and verbalized understanding.  All questions (if any) were answered. Alvis Lemmings, RN 11/13/2021 10:18 AM    He was not scheduled for follow up with Dr. Garen Lah. He was agreeable to seeing him in the office on 12/09/21 @ 9:40 am to discuss his results further.

## 2021-11-17 ENCOUNTER — Other Ambulatory Visit: Payer: Self-pay | Admitting: Nurse Practitioner

## 2021-11-18 ENCOUNTER — Other Ambulatory Visit: Payer: Self-pay | Admitting: Nurse Practitioner

## 2021-11-18 DIAGNOSIS — I428 Other cardiomyopathies: Secondary | ICD-10-CM

## 2021-11-18 DIAGNOSIS — I10 Essential (primary) hypertension: Secondary | ICD-10-CM

## 2021-11-27 ENCOUNTER — Other Ambulatory Visit: Payer: Self-pay | Admitting: *Deleted

## 2021-11-27 MED ORDER — SIMVASTATIN 40 MG PO TABS: 40.0000 mg | ORAL_TABLET | Freq: Every day | ORAL | 0 refills | Status: DC

## 2021-12-02 ENCOUNTER — Encounter: Payer: Self-pay | Admitting: Nurse Practitioner

## 2021-12-02 DIAGNOSIS — E1165 Type 2 diabetes mellitus with hyperglycemia: Secondary | ICD-10-CM

## 2021-12-02 DIAGNOSIS — I10 Essential (primary) hypertension: Secondary | ICD-10-CM

## 2021-12-02 DIAGNOSIS — I428 Other cardiomyopathies: Secondary | ICD-10-CM

## 2021-12-02 MED ORDER — OZEMPIC (1 MG/DOSE) 4 MG/3ML ~~LOC~~ SOPN: PEN_INJECTOR | SUBCUTANEOUS | 0 refills | Status: DC

## 2021-12-02 MED ORDER — FUROSEMIDE 20 MG PO TABS: ORAL_TABLET | ORAL | 0 refills | Status: DC

## 2021-12-02 NOTE — Telephone Encounter (Signed)
Alexander [name] called in wanted to get a refill on the Ozempic pen and glucophage and furosemide.   Encourage patient to contact the pharmacy for refills or they can request refills through [name]:  Please schedule appointment if longer than 1 year  NEXT APPOINTMENT DATE:   MEDICATION: ozempic, glucophage, furosemide  Is the patient out of medication? yes   PHARMACY: [facility]  Let patient know to contact pharmacy at the end of the day to make sure medication is ready.  Please notify patient to allow 48-72 hours to process  CLINICAL FILLS OUT ALL BELOW:   LAST REFILL:  QTY:  REFILL DATE:    OTHER COMMENTS:    Okay for refill?  Please advise

## 2021-12-02 NOTE — Telephone Encounter (Signed)
Spoke with patient. Advised patient that per our records [facility] should still have a refill on file for Glipizide. Furosemide was refilled last to [facility], re sent to [facility] instead. Patient was advised that if he just picked up Furosemide from [facility] will not be able to fill it right now. Ozempic refilled.

## 2021-12-09 ENCOUNTER — Encounter: Payer: Self-pay | Admitting: Cardiology

## 2021-12-09 ENCOUNTER — Other Ambulatory Visit: Payer: Self-pay

## 2021-12-09 ENCOUNTER — Ambulatory Visit (INDEPENDENT_AMBULATORY_CARE_PROVIDER_SITE_OTHER): Payer: 59 | Admitting: Cardiology

## 2021-12-09 VITALS — BP 120/82 | HR 98 | Ht 69.0 in | Wt 243.0 lb

## 2021-12-09 DIAGNOSIS — I428 Other cardiomyopathies: Secondary | ICD-10-CM | POA: Diagnosis not present

## 2021-12-09 DIAGNOSIS — E7800 Pure hypercholesterolemia, unspecified: Secondary | ICD-10-CM | POA: Diagnosis not present

## 2021-12-09 DIAGNOSIS — I10 Essential (primary) hypertension: Secondary | ICD-10-CM | POA: Diagnosis not present

## 2021-12-09 MED ORDER — ENTRESTO 49-51 MG PO TABS: 1.0000 | ORAL_TABLET | Freq: Two times a day (BID) | ORAL | 0 refills | Status: DC

## 2021-12-09 MED ORDER — SPIRONOLACTONE 25 MG PO TABS: 12.5000 mg | ORAL_TABLET | Freq: Every day | ORAL | 2 refills | Status: DC
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
## 2021-12-09 NOTE — Patient Instructions (Signed)
Medication Instructions:   Your physician has recommended you make the following change in your medication:   STOP taking your Losartan.  2.   DECREASE your Aldactone to half a pill (12.5 MG) once a day.  3.   START taking Entresto 49-51 MG twice a day.  *If you need a refill on your cardiac medications before your next appointment, please call your pharmacy*   Lab Work:  None Ordered  If you have labs (blood work) drawn today and your tests are completely normal, you will receive your results only by: [place] (if you have MyChart) OR A paper copy in the mail If you have any lab test that is abnormal or we need to change your treatment, we will call you to review the results.   Testing/Procedures:  None Ordered   Follow-Up: At [facility], you and your health needs are our priority.  As part of our continuing mission to provide you with exceptional heart care, we have created designated Provider Care Teams.  These Care Teams include your primary Cardiologist (physician) and Advanced Practice Providers (APPs -  Physician Assistants and Nurse Practitioners) who all work together to provide you with the care you need, when you need it.  We recommend signing up for the patient portal called "MyChart".  Sign up information is provided on this After Visit Summary.  MyChart is used to connect with patients for Virtual Visits (Telemedicine).  Patients are able to view lab/test results, encounter notes, upcoming appointments, etc.  Non-urgent messages can be sent to your provider as well.   To learn more about what you can do with MyChart, go to [email or web address].    Your next appointment:   4 week(s)  The format for your next appointment:   In Person  Provider:   You may see Kate Sable, MD or one of the following Advanced Practice Providers on your designated Care Team:   Murray Hodgkins, NP Christell Faith, PA-C Cadence Kathlen Mody, [place]    Other  Instructions

## 2021-12-12 ENCOUNTER — Telehealth: Payer: Self-pay

## 2021-12-12 NOTE — Telephone Encounter (Signed)
Incoming fax from [place] needed for [employer] the number listed on the fax [phone]  Spoke with Alonza Smoker - she then teansferred me to the Employee PA dept.  Spoke with Dominque who created a Key for me to use through Covermymeds to complete the PA.  [facility] Mall[facility] JR KeyGlennon Mac - PA Case ID: [id number]   Darlin Drop (Key: [id number])  [employer] is reviewing your PA request. Typically an electronic response will be received within 24-72 hours. To check for an update later, open this request from your dashboard.  You may close this dialog and return to your dashboard to perform other tasks.

## 2021-12-16 ENCOUNTER — Encounter (HOSPITAL_COMMUNITY): Payer: Self-pay | Admitting: Emergency Medicine

## 2021-12-16 ENCOUNTER — Ambulatory Visit (HOSPITAL_COMMUNITY)
Admission: EM | Admit: 2021-12-16 | Discharge: 2021-12-16 | Disposition: A | Discharge status: Home / Self Care | Payer: 59 | Attending: Emergency Medicine | Admitting: Emergency Medicine

## 2021-12-16 ENCOUNTER — Ambulatory Visit (INDEPENDENT_AMBULATORY_CARE_PROVIDER_SITE_OTHER): Payer: 59

## 2021-12-16 ENCOUNTER — Other Ambulatory Visit: Payer: Self-pay

## 2021-12-16 DIAGNOSIS — M545 Low back pain, unspecified: Secondary | ICD-10-CM | POA: Diagnosis not present

## 2021-12-16 DIAGNOSIS — M79604 Pain in right leg: Secondary | ICD-10-CM | POA: Diagnosis not present

## 2021-12-16 DIAGNOSIS — M25511 Pain in right shoulder: Secondary | ICD-10-CM

## 2021-12-16 MED ORDER — KETOROLAC TROMETHAMINE 60 MG/2ML IM SOLN
60.0000 mg | Freq: Once | INTRAMUSCULAR | Status: AC
  Administered 2021-12-16: 20:00:00 60 mg via INTRAMUSCULAR

## 2021-12-16 MED ORDER — NAPROXEN 500 MG PO TABS: 500.0000 mg | ORAL_TABLET | Freq: Two times a day (BID) | ORAL | 0 refills | Status: DC

## 2021-12-16 MED ORDER — BACLOFEN 10 MG PO TABS: 10.0000 mg | ORAL_TABLET | Freq: Three times a day (TID) | ORAL | 0 refills | Status: DC

## 2021-12-16 MED ORDER — KETOROLAC TROMETHAMINE 60 MG/2ML IM SOLN
INTRAMUSCULAR | Status: AC
  Filled 2021-12-16: qty 2

## 2021-12-16 NOTE — Discharge Instructions (Addendum)
The x-rays of your right shoulder and lower back did not show any acute findings.  You received an injection of ketorolac 60 mg for your pain.  Please begin taking naproxen 500 mg twice daily for the next several days.  Please begin baclofen, a very effective muscle relaxer that should help relieve muscle tension and allow you to rest.  You can take this medication 3 times daily.  I anticipate that you will be feeling better in the next 3 to 5 days.  If you are not, please feel free to return for repeat evaluation.
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
## 2021-12-18 ENCOUNTER — Other Ambulatory Visit: Payer: Self-pay | Admitting: Nurse Practitioner

## 2021-12-18 DIAGNOSIS — I10 Essential (primary) hypertension: Secondary | ICD-10-CM

## 2021-12-18 DIAGNOSIS — I428 Other cardiomyopathies: Secondary | ICD-10-CM

## 2021-12-30 ENCOUNTER — Ambulatory Visit: Payer: 59 | Admitting: Nurse Practitioner

## 2022-01-06 ENCOUNTER — Other Ambulatory Visit: Payer: Self-pay

## 2022-01-06 ENCOUNTER — Ambulatory Visit (INDEPENDENT_AMBULATORY_CARE_PROVIDER_SITE_OTHER): Payer: 59 | Admitting: Nurse Practitioner

## 2022-01-06 ENCOUNTER — Encounter: Payer: Self-pay | Admitting: Nurse Practitioner

## 2022-01-06 ENCOUNTER — Ambulatory Visit: Payer: 59 | Admitting: Family Medicine

## 2022-01-06 VITALS — BP 134/88 | HR 93 | Temp 98.2°F | Resp 12 | Ht 69.0 in | Wt 243.4 lb

## 2022-01-06 DIAGNOSIS — Z794 Long term (current) use of insulin: Secondary | ICD-10-CM

## 2022-01-06 DIAGNOSIS — E1165 Type 2 diabetes mellitus with hyperglycemia: Secondary | ICD-10-CM | POA: Diagnosis not present

## 2022-01-06 DIAGNOSIS — M25511 Pain in right shoulder: Secondary | ICD-10-CM

## 2022-01-06 DIAGNOSIS — M545 Low back pain, unspecified: Secondary | ICD-10-CM

## 2022-01-06 LAB — POCT GLYCOSYLATED HEMOGLOBIN (HGB A1C): Hemoglobin A1C: 9.5 % — AB (ref 4.0–5.6)

## 2022-01-06 MED ORDER — BACLOFEN 5 MG PO TABS: 5.0000 mg | ORAL_TABLET | Freq: Two times a day (BID) | ORAL | 0 refills | Status: AC | PRN

## 2022-01-06 MED ORDER — LANTUS SOLOSTAR 100 UNIT/ML ~~LOC~~ SOPN: PEN_INJECTOR | SUBCUTANEOUS | 0 refills | Status: DC

## 2022-01-06 NOTE — Assessment & Plan Note (Signed)
Patient has not been checking his glucose adequately.  A1c has risen slightly since last office visit.  He is currently on glipizide, metformin, Lantus, and Ozempic.  Did have discussion with patient about increasing Ozempic from 1 mg to 2 mg patient deferred at current time we will increase his insulin by 2 units currently encourage patient to check glucose twice daily and report numbers back so he can titrate more aggressively with insulin.
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
## 2022-01-06 NOTE — Patient Instructions (Addendum)
You need to check your sugar twice daily. First thing in the morning, before you eat or drink, and then before you give yourself insulin at night I am going to get you in with Dr Lorelei Pont for further evaluation of the shoulder and back and he maybe able to do an injection, but that will be something you all will have to discuss.  Follow up with me in 3 months. I need you to send me the sugar readings over the next week and we can make adjustments from there. You can send them via mychart or call them into the staff
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
## 2022-01-13 ENCOUNTER — Ambulatory Visit (INDEPENDENT_AMBULATORY_CARE_PROVIDER_SITE_OTHER): Payer: 59 | Admitting: Family Medicine

## 2022-01-13 ENCOUNTER — Encounter: Payer: Self-pay | Admitting: Family Medicine

## 2022-01-13 ENCOUNTER — Other Ambulatory Visit: Payer: Self-pay

## 2022-01-13 ENCOUNTER — Other Ambulatory Visit: Payer: Self-pay | Admitting: Nurse Practitioner

## 2022-01-13 VITALS — BP 148/98 | HR 90 | Temp 97.6°F | Ht 69.0 in | Wt 240.5 lb

## 2022-01-13 DIAGNOSIS — Z794 Long term (current) use of insulin: Secondary | ICD-10-CM | POA: Diagnosis not present

## 2022-01-13 DIAGNOSIS — M25511 Pain in right shoulder: Secondary | ICD-10-CM | POA: Diagnosis not present

## 2022-01-13 DIAGNOSIS — M542 Cervicalgia: Secondary | ICD-10-CM | POA: Diagnosis not present

## 2022-01-13 DIAGNOSIS — E1165 Type 2 diabetes mellitus with hyperglycemia: Secondary | ICD-10-CM | POA: Diagnosis not present

## 2022-01-13 DIAGNOSIS — M545 Low back pain, unspecified: Secondary | ICD-10-CM

## 2022-01-13 MED ORDER — TRIAMCINOLONE ACETONIDE 40 MG/ML IJ SUSP
40.0000 mg | Freq: Once | INTRAMUSCULAR | Status: AC
  Administered 2022-01-13: 40 mg via INTRA_ARTICULAR

## 2022-01-13 MED ORDER — NAPROXEN 500 MG PO TABS: 500.0000 mg | ORAL_TABLET | Freq: Two times a day (BID) | ORAL | 1 refills | Status: DC
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
## 2022-01-15 ENCOUNTER — Other Ambulatory Visit: Payer: Self-pay

## 2022-01-15 ENCOUNTER — Encounter: Payer: Self-pay | Admitting: Physician Assistant

## 2022-01-15 ENCOUNTER — Ambulatory Visit (INDEPENDENT_AMBULATORY_CARE_PROVIDER_SITE_OTHER): Payer: 59 | Admitting: Physician Assistant

## 2022-01-15 VITALS — BP 148/100 | HR 97 | Ht 69.0 in | Wt 233.5 lb

## 2022-01-15 DIAGNOSIS — E119 Type 2 diabetes mellitus without complications: Secondary | ICD-10-CM

## 2022-01-15 DIAGNOSIS — I428 Other cardiomyopathies: Secondary | ICD-10-CM | POA: Diagnosis not present

## 2022-01-15 DIAGNOSIS — Z794 Long term (current) use of insulin: Secondary | ICD-10-CM

## 2022-01-15 DIAGNOSIS — I2510 Atherosclerotic heart disease of native coronary artery without angina pectoris: Secondary | ICD-10-CM | POA: Diagnosis not present

## 2022-01-15 DIAGNOSIS — E785 Hyperlipidemia, unspecified: Secondary | ICD-10-CM

## 2022-01-15 DIAGNOSIS — I10 Essential (primary) hypertension: Secondary | ICD-10-CM | POA: Diagnosis not present

## 2022-01-15 DIAGNOSIS — I5020 Unspecified systolic (congestive) heart failure: Secondary | ICD-10-CM | POA: Diagnosis not present

## 2022-01-15 MED ORDER — SACUBITRIL-VALSARTAN 97-103 MG PO TABS: 1.0000 | ORAL_TABLET | Freq: Two times a day (BID) | ORAL | 3 refills | Status: DC

## 2022-01-15 MED ORDER — SACUBITRIL-VALSARTAN 97-103 MG PO TABS: 1.0000 | ORAL_TABLET | Freq: Two times a day (BID) | ORAL | 6 refills | Status: DC

## 2022-01-15 NOTE — Patient Instructions (Signed)
Medication Instructions:  Your physician has recommended you make the following change in your medication:   INCREASE Entresto to 97/103 mg twice a day   *If you need a refill on your cardiac medications before your next appointment, please call your pharmacy*   Lab Work: BMP today  If you have labs (blood work) drawn today and your tests are completely normal, you will receive your results only by: [place] (if you have MyChart) OR A paper copy in the mail If you have any lab test that is abnormal or we need to change your treatment, we will call you to review the results.   Testing/Procedures: None   Follow-Up: At [facility], you and your health needs are our priority.  As part of our continuing mission to provide you with exceptional heart care, we have created designated Provider Care Teams.  These Care Teams include your primary Cardiologist (physician) and Advanced Practice Providers (APPs -  Physician Assistants and Nurse Practitioners) who all work together to provide you with the care you need, when you need it.   Your next appointment:   1 month(s)  The format for your next appointment:   In Person  Provider:   Kate Sable, MD or Christell Faith, PA-C

## 2022-01-15 NOTE — Progress Notes (Signed)
Medication Samples have been provided to the patient.  Drug name: Delene Loll        Strength: 49/51 mg         Qty: 2 bottles   LOT: [id number]   Exp.Date: 11/24
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
## 2022-01-16 LAB — BASIC METABOLIC PANEL
BUN/Creatinine Ratio: 22 — ABNORMAL HIGH (ref 9–20)
BUN: 22 mg/dL (ref 6–24)
CO2: 23 mmol/L (ref 20–29)
Calcium: 9.5 mg/dL (ref 8.7–10.2)
Chloride: 100 mmol/L (ref 96–106)
Creatinine, Ser: 1.02 mg/dL (ref 0.76–1.27)
Glucose: 324 mg/dL — ABNORMAL HIGH (ref 70–99)
Potassium: 4.5 mmol/L (ref 3.5–5.2)
Sodium: 138 mmol/L (ref 134–144)
eGFR: 86 mL/min/{1.73_m2} (ref >59)

## 2022-01-17 ENCOUNTER — Telehealth: Payer: Self-pay | Admitting: *Deleted

## 2022-01-17 NOTE — Telephone Encounter (Signed)
-----   Message from Rise Mu, PA-C sent at 01/16/2022  7:16 AM EST ----- Random glucose is significantly elevated with known insulin-dependent diabetes Renal function normal Potassium at goal  Recommendations: -Follow-up with PCP for ongoing diabetic care -Continue with plan discussed at office visit yesterday

## 2022-01-17 NOTE — Telephone Encounter (Signed)
Left voicemail message to call back for review of results.  

## 2022-01-19 ENCOUNTER — Ambulatory Visit
Admission: EM | Admit: 2022-01-19 | Discharge: 2022-01-19 | Disposition: A | Discharge status: Home / Self Care | Payer: 59 | Attending: Internal Medicine | Admitting: Internal Medicine

## 2022-01-19 ENCOUNTER — Other Ambulatory Visit: Payer: Self-pay

## 2022-01-19 DIAGNOSIS — J069 Acute upper respiratory infection, unspecified: Secondary | ICD-10-CM

## 2022-01-19 MED ORDER — BENZONATATE 100 MG PO CAPS: 100.0000 mg | ORAL_CAPSULE | Freq: Three times a day (TID) | ORAL | 0 refills | Status: DC | PRN
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
## 2022-01-19 NOTE — Discharge Instructions (Signed)
You have a viral upper respiratory infection that should resolve on its own in the next few days.  You have been prescribed a cough medication.  Also recommend Coricidin HBP that you can get over-the-counter.  COVID-19 and flu test is pending.  We will call if it is positive.

## 2022-01-19 NOTE — ED Triage Notes (Signed)
Pt c/o cough, sore throat, "tearing up in the eyes making my eyes kind of blurry a little bit." Headache, diarrhea, body aches   Denies nasal congestion, earache, nausea, vomiting, body chills   Onset ~ thurs

## 2022-01-20 ENCOUNTER — Ambulatory Visit: Payer: 59 | Admitting: Cardiology

## 2022-01-20 LAB — COVID-19, FLU A+B NAA
Influenza A, NAA: NOT DETECTED
Influenza B, NAA: NOT DETECTED
SARS-CoV-2, NAA: NOT DETECTED

## 2022-01-21 NOTE — Telephone Encounter (Signed)
Patient has reviewed results via My Chart 

## 2022-02-03 ENCOUNTER — Other Ambulatory Visit: Payer: Self-pay

## 2022-02-03 ENCOUNTER — Encounter: Payer: Self-pay | Admitting: Physical Therapy

## 2022-02-03 ENCOUNTER — Ambulatory Visit (INDEPENDENT_AMBULATORY_CARE_PROVIDER_SITE_OTHER): Payer: 59 | Admitting: Physical Therapy

## 2022-02-03 DIAGNOSIS — M25611 Stiffness of right shoulder, not elsewhere classified: Secondary | ICD-10-CM | POA: Diagnosis not present

## 2022-02-03 DIAGNOSIS — M6281 Muscle weakness (generalized): Secondary | ICD-10-CM

## 2022-02-03 DIAGNOSIS — M542 Cervicalgia: Secondary | ICD-10-CM

## 2022-02-03 DIAGNOSIS — M25511 Pain in right shoulder: Secondary | ICD-10-CM

## 2022-02-03 DIAGNOSIS — R293 Abnormal posture: Secondary | ICD-10-CM | POA: Diagnosis not present
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
## 2022-02-10 ENCOUNTER — Other Ambulatory Visit: Payer: Self-pay

## 2022-02-10 ENCOUNTER — Encounter: Payer: Self-pay | Admitting: Physical Therapy

## 2022-02-10 ENCOUNTER — Ambulatory Visit (INDEPENDENT_AMBULATORY_CARE_PROVIDER_SITE_OTHER): Payer: 59 | Admitting: Physical Therapy

## 2022-02-10 DIAGNOSIS — M6281 Muscle weakness (generalized): Secondary | ICD-10-CM

## 2022-02-10 DIAGNOSIS — R293 Abnormal posture: Secondary | ICD-10-CM

## 2022-02-10 DIAGNOSIS — M25511 Pain in right shoulder: Secondary | ICD-10-CM

## 2022-02-10 DIAGNOSIS — M542 Cervicalgia: Secondary | ICD-10-CM

## 2022-02-10 DIAGNOSIS — M25611 Stiffness of right shoulder, not elsewhere classified: Secondary | ICD-10-CM
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
## 2022-02-17 ENCOUNTER — Other Ambulatory Visit: Payer: Self-pay

## 2022-02-17 ENCOUNTER — Encounter: Payer: Self-pay | Admitting: Physical Therapy

## 2022-02-17 ENCOUNTER — Ambulatory Visit (INDEPENDENT_AMBULATORY_CARE_PROVIDER_SITE_OTHER): Payer: 59 | Admitting: Cardiology

## 2022-02-17 ENCOUNTER — Ambulatory Visit (INDEPENDENT_AMBULATORY_CARE_PROVIDER_SITE_OTHER): Payer: 59 | Admitting: Physical Therapy

## 2022-02-17 ENCOUNTER — Encounter: Payer: Self-pay | Admitting: Cardiology

## 2022-02-17 VITALS — BP 130/90 | HR 92 | Ht 69.0 in | Wt 238.0 lb

## 2022-02-17 DIAGNOSIS — M25511 Pain in right shoulder: Secondary | ICD-10-CM | POA: Diagnosis not present

## 2022-02-17 DIAGNOSIS — E7800 Pure hypercholesterolemia, unspecified: Secondary | ICD-10-CM | POA: Diagnosis not present

## 2022-02-17 DIAGNOSIS — R293 Abnormal posture: Secondary | ICD-10-CM

## 2022-02-17 DIAGNOSIS — R072 Precordial pain: Secondary | ICD-10-CM | POA: Diagnosis not present

## 2022-02-17 DIAGNOSIS — M6281 Muscle weakness (generalized): Secondary | ICD-10-CM | POA: Diagnosis not present

## 2022-02-17 DIAGNOSIS — I428 Other cardiomyopathies: Secondary | ICD-10-CM | POA: Diagnosis not present

## 2022-02-17 DIAGNOSIS — M542 Cervicalgia: Secondary | ICD-10-CM

## 2022-02-17 DIAGNOSIS — M25611 Stiffness of right shoulder, not elsewhere classified: Secondary | ICD-10-CM | POA: Diagnosis not present

## 2022-02-17 DIAGNOSIS — I10 Essential (primary) hypertension: Secondary | ICD-10-CM

## 2022-02-17 MED ORDER — ISOSORBIDE MONONITRATE ER 30 MG PO TB24: 15.0000 mg | ORAL_TABLET | Freq: Every day | ORAL | 0 refills | Status: DC

## 2022-02-17 MED ORDER — OMEPRAZOLE MAGNESIUM 20 MG PO TBEC: 20.0000 mg | DELAYED_RELEASE_TABLET | Freq: Every day | ORAL | Status: AC
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
## 2022-02-17 NOTE — Patient Instructions (Signed)
Medication Instructions:   Your physician has recommended you make the following change in your medication:    START taking Prilosec 20 MG once a day. You can buy this over the counter.  2.    START taking Isosorbide 15 MG once a day (this will be 0.5 tablet)  *If you need a refill on your cardiac medications before your next appointment, please call your pharmacy*   Lab Work: None ordered If you have labs (blood work) drawn today and your tests are completely normal, you will receive your results only by: [place] (if you have MyChart) OR A paper copy in the mail If you have any lab test that is abnormal or we need to change your treatment, we will call you to review the results.   Testing/Procedures:  Your physician has requested that you have an echocardiogram in 4 months. Echocardiography is a painless test that uses sound waves to create images of your heart. It provides your doctor with information about the size and shape of your heart and how well your hearts chambers and valves are working. This procedure takes approximately one hour. There are no restrictions for this procedure.    Follow-Up: At [facility], you and your health needs are our priority.  As part of our continuing mission to provide you with exceptional heart care, we have created designated Provider Care Teams.  These Care Teams include your primary Cardiologist (physician) and Advanced Practice Providers (APPs -  Physician Assistants and Nurse Practitioners) who all work together to provide you with the care you need, when you need it.  We recommend signing up for the patient portal called "MyChart".  Sign up information is provided on this After Visit Summary.  MyChart is used to connect with patients for Virtual Visits (Telemedicine).  Patients are able to view lab/test results, encounter notes, upcoming appointments, etc.  Non-urgent messages can be sent to your provider as well.   To learn more  about what you can do with MyChart, go to [email or web address].    Your next appointment:   In 4 months after Echocardiogram   The format for your next appointment:   In Person  Provider:   You may see Kate Sable, MD or one of the following Advanced Practice Providers on your designated Care Team:   Murray Hodgkins, NP Christell Faith, PA-C Cadence Kathlen Mody, [place]    Other Instructions

## 2022-03-16 ENCOUNTER — Other Ambulatory Visit: Payer: Self-pay | Admitting: Family Medicine

## 2022-03-16 DIAGNOSIS — M542 Cervicalgia: Secondary | ICD-10-CM

## 2022-03-16 DIAGNOSIS — M25511 Pain in right shoulder: Secondary | ICD-10-CM

## 2022-03-16 DIAGNOSIS — E1165 Type 2 diabetes mellitus with hyperglycemia: Secondary | ICD-10-CM
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
## 2022-03-17 ENCOUNTER — Other Ambulatory Visit: Payer: Self-pay

## 2022-03-17 MED ORDER — SIMVASTATIN 40 MG PO TABS
40.0000 mg | ORAL_TABLET | Freq: Every day | ORAL | 3 refills | Status: DC
Start: 2022-03-17 — End: 2023-02-17

## 2022-03-24 LAB — HM DIABETES EYE EXAM

## 2022-04-14 ENCOUNTER — Encounter: Payer: Self-pay | Admitting: Nurse Practitioner

## 2022-04-14 ENCOUNTER — Encounter: Payer: Self-pay | Admitting: Optometrist

## 2022-04-14 ENCOUNTER — Ambulatory Visit (INDEPENDENT_AMBULATORY_CARE_PROVIDER_SITE_OTHER): Payer: 59 | Admitting: Nurse Practitioner

## 2022-04-14 DIAGNOSIS — Z794 Long term (current) use of insulin: Secondary | ICD-10-CM

## 2022-04-14 DIAGNOSIS — Z23 Encounter for immunization: Secondary | ICD-10-CM

## 2022-04-14 DIAGNOSIS — E1165 Type 2 diabetes mellitus with hyperglycemia: Secondary | ICD-10-CM

## 2022-04-14 LAB — POCT GLYCOSYLATED HEMOGLOBIN (HGB A1C): Hemoglobin A1C: 9.4 % — AB (ref 4.0–5.6)

## 2022-04-14 LAB — COMPREHENSIVE METABOLIC PANEL
ALT: 17 U/L (ref 0–53)
AST: 17 U/L (ref 0–37)
Albumin: 4.1 g/dL (ref 3.5–5.2)
Alkaline Phosphatase: 60 U/L (ref 39–117)
BUN: 16 mg/dL (ref 6–23)
CO2: 26 mEq/L (ref 19–32)
Calcium: 8.9 mg/dL (ref 8.4–10.5)
Chloride: 102 mEq/L (ref 96–112)
Creatinine, Ser: 0.89 mg/dL (ref 0.40–1.50)
GFR: 95.41 mL/min (ref >60.00)
Glucose, Bld: 339 mg/dL — ABNORMAL HIGH (ref 70–99)
Potassium: 4.2 mEq/L (ref 3.5–5.1)
Sodium: 136 mEq/L (ref 135–145)
Total Bilirubin: 0.6 mg/dL (ref 0.2–1.2)
Total Protein: 6.8 g/dL (ref 6.0–8.3)

## 2022-04-14 MED ORDER — SEMAGLUTIDE (2 MG/DOSE) 8 MG/3ML ~~LOC~~ SOPN: 2.0000 mg | PEN_INJECTOR | SUBCUTANEOUS | 1 refills | Status: DC

## 2022-04-14 NOTE — Assessment & Plan Note (Signed)
Currently maintained on glipizide 10 mg twice daily, metformin 1000 mg twice daily, Lantus 20 units nightly, and Ozempic 1 mg once weekly.  Patient's A1c went from 9.5-9.4 today.  Did discuss different options with patient inclusive of increasing Ozempic dose or increasing Lantus dose.  Did discuss with patient increasing Lantus dose likely will cause weight gain and in regards of increasing Ozempic would likely facilitate weight loss.  After discussion we will titrate Ozempic 1 mg to Ozempic 2 mg once weekly.  Patient traditionally injects on Monday so he can defer until next week before starting the 10 mg dose.  Did do basic foot exam and discussed foot care with patient in office ?

## 2022-04-14 NOTE — Progress Notes (Signed)
? ?Established Patient Office Visit ? ?Subjective   ?Patient ID: Alexander [name]., male    DOB: 1966/06/17  Age: 56 y.o. MRN: [id number] ? ?Chief Complaint  ?Patient presents with  ? Diabetes  ?  Follow up, patient checks sugar at home and averaging out around 120-175 after eating  ? ? ? ?Checking sugars at home about once a week first thing in the morning. 125-150. ?Currently on glipizide, metformin, ozepmic, and 20-22 units of lantus ? ?Diet: states that he will stop at cracker barrel eat egg, 2 pieces of bacon, and toast. LUnch will eat a salad or grilled protein like chicken or fish. Eats veggies, cabbage, broccoli, greensbeans,okra. Dinner time will eat a meat and veggie and salad. Will drink a soda once a week. Water and unsweet tea. ?On the weekend will have 1-2 drinks of plain liqour.  ? ?Exercise: will walk approx 3 times for approx 30-60 mins at at time ? ? ? ?Review of Systems  ?Constitutional:  Negative for chills and fever.  ?Respiratory:  Negative for cough and shortness of breath.   ?Cardiovascular:  Negative for chest pain and leg swelling.  ?Gastrointestinal:  Negative for diarrhea, nausea and vomiting.  ? ?  ?Objective:  ?  ? ?BP 118/86   Pulse 90   Temp (!) 96 ?F (35.6 ?C)   Resp 14   Ht '5\' 9"'$  (1.753 m)   Wt 233 lb 6 oz (105.9 kg)   SpO2 97%   BMI 34.46 kg/m?  ? ? ?Physical Exam ?Vitals and nursing note reviewed.  ?Constitutional:   ?   Appearance: Normal appearance.  ?Cardiovascular:  ?   Rate and Rhythm: Normal rate and regular rhythm.  ?   Pulses: Normal pulses.  ?   Heart sounds: Normal heart sounds.  ?Pulmonary:  ?   Effort: Pulmonary effort is normal.  ?   Breath sounds: Normal breath sounds.  ?Abdominal:  ?   General: Bowel sounds are normal.  ?Musculoskeletal:  ?   Right lower leg: No edema.  ?   Left lower leg: No edema.  ?Feet:  ?   Right foot:  ?   Skin integrity: Dry skin present.  ?   Toenail Condition: Right toenails are abnormally thick.  ?   Left foot:  ?   Skin integrity: Dry  skin present.  ?   Toenail Condition: Left toenails are abnormally thick.  ?   Comments: Has a splitting of the skin inbetween 3rd and 4th digit. States he does clean between the toes. Also will use lotion. 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 Discouraged use. ?Neurological:  ?   Mental Status: He is alert.  ? ? ? ?Results for orders placed or performed in visit on 04/14/22  ?POCT glycosylated hemoglobin (Hb A1C)  ?Result Value Ref Range  ? Hemoglobin A1C 9.4 (A) 4.0 - 5.6 %  ? HbA1c POC (<> result, manual entry)    ? HbA1c, POC (prediabetic range)    ? HbA1c, POC (controlled diabetic range)    ? ? ? ? ?The ASCVD Risk score (Arnett DK, et al., 2019) failed to calculate for the following reasons: ?  The valid total cholesterol range is 130 to 320 mg/dL ? ?  ?Assessment & Plan:  ? ?Problem List Items Addressed This Visit   ? ?  ? Endocrine  ? Uncontrolled type 2 diabetes mellitus with hyperglycemia, with long-term current use of insulin (HCC) (Chronic)  ?  Relevant Medications  ? Semaglutide, 2 MG/DOSE, 8 MG/3ML SOPN  ? Other Relevant Orders  ? POCT glycosylated hemoglobin (Hb A1C) (Completed)  ? Comprehensive metabolic panel  ?  ? Other  ? Need for Td vaccine  ? Relevant Orders  ? Td : Tetanus/diphtheria >7yo Preservative  free (Completed)  ? ? ?No follow-ups on file.  ? ? ?Romilda Garret, NP ? ?

## 2022-04-14 NOTE — Patient Instructions (Signed)
Nice to see you today ?I will be in touch with labs once I have the result ?We are upping the ozempic from '1mg'$  to '2mg'$  weekly. You can start next week since you gave yourself your dose this week ?I will reach out to Dr. Lorelei Pont about releasing you to work ?Follow up with me in 3 months, sooner if you need me ? ?I would like for you to check your blood sugar everyday. Especially before giving yourself insulin (Lantus) at night. ?

## 2022-04-14 NOTE — Assessment & Plan Note (Signed)
Td vaccine administered in office. ?

## 2022-04-21 ENCOUNTER — Ambulatory Visit (INDEPENDENT_AMBULATORY_CARE_PROVIDER_SITE_OTHER): Payer: 59 | Admitting: Family Medicine

## 2022-04-21 ENCOUNTER — Encounter: Payer: Self-pay | Admitting: Family Medicine

## 2022-04-21 VITALS — BP 140/82 | HR 94 | Temp 98.2°F | Ht 69.0 in | Wt 236.1 lb

## 2022-04-21 DIAGNOSIS — M25511 Pain in right shoulder: Secondary | ICD-10-CM

## 2022-04-21 DIAGNOSIS — M542 Cervicalgia: Secondary | ICD-10-CM | POA: Diagnosis not present
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
## 2022-05-05 ENCOUNTER — Other Ambulatory Visit: Payer: Self-pay

## 2022-05-05 MED ORDER — ISOSORBIDE MONONITRATE ER 30 MG PO TB24: 15.0000 mg | ORAL_TABLET | Freq: Every day | ORAL | 0 refills | Status: DC

## 2022-05-27 ENCOUNTER — Other Ambulatory Visit: Payer: Self-pay

## 2022-05-27 DIAGNOSIS — I10 Essential (primary) hypertension: Secondary | ICD-10-CM

## 2022-05-27 MED ORDER — CARVEDILOL 25 MG PO TABS: 25.0000 mg | ORAL_TABLET | Freq: Two times a day (BID) | ORAL | 0 refills | Status: DC

## 2022-05-30 ENCOUNTER — Ambulatory Visit (INDEPENDENT_AMBULATORY_CARE_PROVIDER_SITE_OTHER): Payer: 59

## 2022-05-30 DIAGNOSIS — I428 Other cardiomyopathies: Secondary | ICD-10-CM | POA: Diagnosis not present

## 2022-05-30 DIAGNOSIS — R072 Precordial pain: Secondary | ICD-10-CM

## 2022-05-30 LAB — ECHOCARDIOGRAM COMPLETE
AR max vel: 2.21 cm2
AV Area VTI: 2.54 cm2
AV Area mean vel: 2.47 cm2
AV Mean grad: 4.3 mmHg
AV Peak grad: 8.3 mmHg
Ao pk vel: 1.44 m/s
S' Lateral: 4.5 cm

## 2022-05-30 MED ORDER — PERFLUTREN LIPID MICROSPHERE
1.0000 mL | INTRAVENOUS | Status: AC | PRN
  Administered 2022-05-30: 2 mL via INTRAVENOUS

## 2022-06-01 ENCOUNTER — Other Ambulatory Visit: Payer: Self-pay | Admitting: Cardiology

## 2022-06-01 DIAGNOSIS — I10 Essential (primary) hypertension: Secondary | ICD-10-CM

## 2022-06-02 ENCOUNTER — Telehealth: Payer: Self-pay

## 2022-06-02 ENCOUNTER — Ambulatory Visit: Payer: 59 | Admitting: Cardiology

## 2022-06-02 ENCOUNTER — Telehealth: Payer: Self-pay | Admitting: Cardiology

## 2022-06-02 DIAGNOSIS — E1165 Type 2 diabetes mellitus with hyperglycemia: Secondary | ICD-10-CM

## 2022-06-02 MED ORDER — LANTUS SOLOSTAR 100 UNIT/ML ~~LOC~~ SOPN: PEN_INJECTOR | SUBCUTANEOUS | 0 refills | Status: DC

## 2022-06-02 NOTE — Telephone Encounter (Signed)
The patient has been notified of the result and verbalized understanding.  All questions (if any) were answered. Kavin Leech, RN 06/02/2022 2:05 PM

## 2022-06-02 NOTE — Telephone Encounter (Signed)
Echo shows moderately reduced ejection fraction/systolic function.  Continue medications as prescribed.  Keep follow-up appointments.

## 2022-06-02 NOTE — Telephone Encounter (Signed)
Patient is requesting a call back to discuss echo results. 

## 2022-06-02 NOTE — Telephone Encounter (Signed)
Refill provided. 3 months f/u DM scheduled also. Patient advised

## 2022-06-02 NOTE — Telephone Encounter (Signed)
MEDICATION:insulin glargine (LANTUS SOLOSTAR) 100 UNIT/ML Solostar Pen  PHARMACY:[id number] - , [place] - [address] AT [address]  Comments: Patient is completely out   **Let patient know to contact pharmacy at the end of the day to make sure medication is ready. **  ** Please notify patient to allow 48-72 hours to process**  **Encourage patient to contact the pharmacy for refills or they can request refills through [facility]**

## 2022-06-03 MED ORDER — LANTUS SOLOSTAR 100 UNIT/ML ~~LOC~~ SOPN: PEN_INJECTOR | SUBCUTANEOUS | 0 refills | Status: DC

## 2022-06-03 NOTE — Telephone Encounter (Signed)
Patient called re: refill below.  Patient unsure if [facility] will do a 3 month supply, usually needs to be sent to   [facility] ) - [place], [place] Phone:  [phone]  Fax:  [phone]      [facility] is waiting on this prescription  Patient asked for follow-up in my chart when this has been addressed.

## 2022-06-03 NOTE — Telephone Encounter (Signed)
RX sent to [facility] as requested. Patient advised via [employer].

## 2022-06-03 NOTE — Telephone Encounter (Signed)
This medication was refilled already on 05/27/22

## 2022-06-03 NOTE — Addendum Note (Signed)
Addended by: Kris Mouton on: 06/03/2022 01:57 PM   Modules accepted: Orders

## 2022-06-06 ENCOUNTER — Other Ambulatory Visit: Payer: Self-pay | Admitting: Family Medicine

## 2022-06-06 DIAGNOSIS — M25511 Pain in right shoulder: Secondary | ICD-10-CM

## 2022-06-06 DIAGNOSIS — Z794 Long term (current) use of insulin: Secondary | ICD-10-CM

## 2022-06-06 DIAGNOSIS — M542 Cervicalgia: Secondary | ICD-10-CM

## 2022-06-06 NOTE — Telephone Encounter (Signed)
Last office visit 04/21/22 with Dr. Lorelei Pont for Right Shoulder Pain.  Last refilled 03/17/22 for #60 with 1 refill.  Next Appt: 07/28/22 with PCP for DM.

## 2022-07-08 ENCOUNTER — Other Ambulatory Visit: Payer: Self-pay | Admitting: Nurse Practitioner

## 2022-07-08 DIAGNOSIS — E1165 Type 2 diabetes mellitus with hyperglycemia: Secondary | ICD-10-CM

## 2022-07-11 ENCOUNTER — Other Ambulatory Visit: Payer: Self-pay | Admitting: *Deleted

## 2022-07-11 MED ORDER — ISOSORBIDE MONONITRATE ER 30 MG PO TB24: 15.0000 mg | ORAL_TABLET | Freq: Every day | ORAL | 0 refills | Status: DC

## 2022-07-28 ENCOUNTER — Ambulatory Visit (INDEPENDENT_AMBULATORY_CARE_PROVIDER_SITE_OTHER): Payer: 59 | Admitting: Nurse Practitioner

## 2022-07-28 ENCOUNTER — Encounter: Payer: Self-pay | Admitting: Cardiology

## 2022-07-28 ENCOUNTER — Ambulatory Visit (INDEPENDENT_AMBULATORY_CARE_PROVIDER_SITE_OTHER): Payer: 59 | Admitting: Cardiology

## 2022-07-28 ENCOUNTER — Encounter: Payer: Self-pay | Admitting: Nurse Practitioner

## 2022-07-28 VITALS — BP 140/90 | HR 91 | Ht 69.0 in | Wt 233.0 lb

## 2022-07-28 VITALS — BP 130/92 | HR 96 | Temp 97.0°F | Resp 12 | Ht 69.0 in | Wt 234.2 lb

## 2022-07-28 DIAGNOSIS — E1165 Type 2 diabetes mellitus with hyperglycemia: Secondary | ICD-10-CM

## 2022-07-28 DIAGNOSIS — Z794 Long term (current) use of insulin: Secondary | ICD-10-CM

## 2022-07-28 DIAGNOSIS — E7800 Pure hypercholesterolemia, unspecified: Secondary | ICD-10-CM

## 2022-07-28 DIAGNOSIS — F4321 Adjustment disorder with depressed mood: Secondary | ICD-10-CM | POA: Diagnosis not present

## 2022-07-28 DIAGNOSIS — I10 Essential (primary) hypertension: Secondary | ICD-10-CM | POA: Diagnosis not present

## 2022-07-28 DIAGNOSIS — I428 Other cardiomyopathies: Secondary | ICD-10-CM

## 2022-07-28 LAB — COMPREHENSIVE METABOLIC PANEL
ALT: 14 U/L (ref 0–53)
AST: 15 U/L (ref 0–37)
Albumin: 4.3 g/dL (ref 3.5–5.2)
Alkaline Phosphatase: 50 U/L (ref 39–117)
BUN: 24 mg/dL — ABNORMAL HIGH (ref 6–23)
CO2: 23 mEq/L (ref 19–32)
Calcium: 9 mg/dL (ref 8.4–10.5)
Chloride: 108 mEq/L (ref 96–112)
Creatinine, Ser: 1.21 mg/dL (ref 0.40–1.50)
GFR: 66.52 mL/min (ref >60.00)
Glucose, Bld: 173 mg/dL — ABNORMAL HIGH (ref 70–99)
Potassium: 4.2 mEq/L (ref 3.5–5.1)
Sodium: 138 mEq/L (ref 135–145)
Total Bilirubin: 1.1 mg/dL (ref 0.2–1.2)
Total Protein: 6.8 g/dL (ref 6.0–8.3)

## 2022-07-28 LAB — LIPID PANEL
Cholesterol: 115 mg/dL (ref 0–200)
HDL: 49.2 mg/dL (ref >39.00)
LDL Cholesterol: 40 mg/dL (ref 0–99)
NonHDL: 66.17
Total CHOL/HDL Ratio: 2
Triglycerides: 131 mg/dL (ref 0.0–149.0)
VLDL: 26.2 mg/dL (ref 0.0–40.0)

## 2022-07-28 LAB — CBC
HCT: 39.9 % (ref 39.0–52.0)
Hemoglobin: 13.8 g/dL (ref 13.0–17.0)
MCHC: 34.5 g/dL (ref 30.0–36.0)
MCV: 89.2 fl (ref 78.0–100.0)
Platelets: 275 10*3/uL (ref 150.0–400.0)
RBC: 4.48 Mil/uL (ref 4.22–5.81)
RDW: 14.4 % (ref 11.5–15.5)
WBC: 5.1 10*3/uL (ref 4.0–10.5)

## 2022-07-28 LAB — POCT GLYCOSYLATED HEMOGLOBIN (HGB A1C): Hemoglobin A1C: 8.6 % — AB (ref 4.0–5.6)

## 2022-07-28 MED ORDER — SPIRONOLACTONE 25 MG PO TABS: 25.0000 mg | ORAL_TABLET | Freq: Every day | ORAL | 2 refills | Status: AC

## 2022-07-28 MED ORDER — LANTUS SOLOSTAR 100 UNIT/ML ~~LOC~~ SOPN: PEN_INJECTOR | SUBCUTANEOUS | 1 refills | Status: DC

## 2022-07-28 NOTE — Assessment & Plan Note (Signed)
Patient recently lost mother approximately 3 days ago.  She is 57 years of age battling dementia.  Per patient report was in hospice did inform patient that hospice has great resources for family including grief counseling if he decides.  If this not available through the services she was using can reach out to me and I can facilitate a referral.  Patient denies HI/SI/AVH.

## 2022-07-28 NOTE — Patient Instructions (Signed)
Nice to see you today I want to see you in 3.5 months for a recheck Continue working on your lifestyle changes If you decide you would like to speak to a grief counselor let me know

## 2022-07-28 NOTE — Patient Instructions (Signed)
Medication Instructions:  Your physician has recommended you make the following change in your medication:  INCREASE Spironolactone to 25 mg daily. An Rx has been sent to your pharmacy.   *If you need a refill on your cardiac medications before your next appointment, please call your pharmacy*   Lab Work: None ordered If you have labs (blood work) drawn today and your tests are completely normal, you will receive your results only by: [place] (if you have MyChart) OR A paper copy in the mail If you have any lab test that is abnormal or we need to change your treatment, we will call you to review the results.   Testing/Procedures: Your physician has requested that you have an echocardiogram. Echocardiography is a painless test that uses sound waves to create images of your heart. It provides your doctor with information about the size and shape of your heart and how well your heart's chambers and valves are working. This procedure takes approximately one hour. There are no restrictions for this procedure. (To be scheduled in 6 months)   Follow-Up: At [facility], you and your health needs are our priority.  As part of our continuing mission to provide you with exceptional heart care, we have created designated Provider Care Teams.  These Care Teams include your primary Cardiologist (physician) and Advanced Practice Providers (APPs -  Physician Assistants and Nurse Practitioners) who all work together to provide you with the care you need, when you need it.  We recommend signing up for the patient portal called "MyChart".  Sign up information is provided on this After Visit Summary.  MyChart is used to connect with patients for Virtual Visits (Telemedicine).  Patients are able to view lab/test results, encounter notes, upcoming appointments, etc.  Non-urgent messages can be sent to your provider as well.   To learn more about what you can do with MyChart, go to  [email or web address].    Your next appointment:   6 month(s) (To be scheduled after the echo)  The format for your next appointment:   In Person  Provider:   You may see Kate Sable, MD or one of the following Advanced Practice Providers on your designated Care Team:   Murray Hodgkins, NP Christell Faith, PA-C Cadence Kathlen Mody, PA-C{     Other Instructions N/A  Important Information About Sugar
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
## 2022-07-28 NOTE — Progress Notes (Signed)
Established Patient Office Visit  Subjective   Patient ID: Alexander [name]., male    DOB: Sep 20, 1966  Age: 56 y.o. MRN: [id number]  Chief Complaint  Patient presents with   Diabetes    Follow up, readings have been 125-150 at home usually fasting    Diabetes Pertinent negatives for diabetes include no chest pain.     DM2: States that we did ozempic '2mg'$  glipizide, 22 units of lantus. States that he has been checking his glucose. States that he was checking it once a week.  Exercise: States that he has been trying to walk and wants to go back to the gym. Tries to walk 30 mins a day   Diet: States that he is trying to go to three meals a day. States he has been doing 2 meals. States that he will have an egg, wheat toast, grits and bacon. Lunch will be cracker barrel, he will not have a whole meal . Eats dinner around 6-7 sometimes at 9pm. Might do light meal or a salad   States that his mother passed away this past 03/26/23. States that she was 57 years old and was battling dementia     Review of Systems  Constitutional:  Negative for chills and fever.  Respiratory:  Negative for shortness of breath.   Cardiovascular:  Negative for chest pain.  Gastrointestinal:  Negative for abdominal pain, constipation, diarrhea, nausea and vomiting.  Neurological:  Positive for tingling (left thigh or left upper arm).  Psychiatric/Behavioral:  Negative for hallucinations and suicidal ideas.       Objective:     BP (!) 130/92   Pulse 96   Temp (!) 97 F (36.1 C)   Resp 12   Ht '5\' 9"'$  (1.753 m)   Wt 234 lb 4 oz (106.3 kg)   SpO2 96%   BMI 34.59 kg/m  BP Readings from Last 3 Encounters:  07/28/22 (!) 130/92  04/21/22 140/82  04/14/22 118/86   Wt Readings from Last 3 Encounters:  07/28/22 234 lb 4 oz (106.3 kg)  04/21/22 236 lb 2 oz (107.1 kg)  04/14/22 233 lb 6 oz (105.9 kg)      Physical Exam Vitals and nursing note reviewed.  Constitutional:      Appearance: Normal  appearance. He is obese.  Cardiovascular:     Rate and Rhythm: Normal rate and regular rhythm.     Heart sounds: Normal heart sounds.  Pulmonary:     Breath sounds: Normal breath sounds.  Musculoskeletal:     Right lower leg: No edema.     Left lower leg: No edema.  Skin:    General: Skin is warm.  Neurological:     Mental Status: He is alert.  Psychiatric:        Mood and Affect: Mood normal.        Behavior: Behavior normal.        Thought Content: Thought content normal.        Judgment: Judgment normal.      Results for orders placed or performed in visit on 07/28/22  POCT glycosylated hemoglobin (Hb A1C)  Result Value Ref Range   Hemoglobin A1C 8.6 (A) 4.0 - 5.6 %   HbA1c POC (<> result, manual entry)     HbA1c, POC (prediabetic range)     HbA1c, POC (controlled diabetic range)        The ASCVD Risk score (Arnett DK, et al., 2019) failed to calculate for the following reasons:  The valid total cholesterol range is 130 to 320 mg/dL    Assessment & Plan:   Problem List Items Addressed This Visit       Cardiovascular and Mediastinum   HTN (hypertension)    Patient slightly above goal today even on recheck.  He does have follow-up with his cardiologist today.        Endocrine   Uncontrolled type 2 diabetes mellitus with hyperglycemia, with long-term current use of insulin (HCC) - Primary (Chronic)    Patient currently on Lantus 24 units daily, glipizide, semaglutide 2 mg, and metformin 1000 mg twice daily.  Patient A1c had a considerable drop to 8.6 today he has lost approximately 2 pounds we will give patient some more time to work on lifestyle modifications versus increasing insulin at this juncture.  So the lowest his A1c has been thus far follow-up in 3 and half months for recheck      Relevant Medications   insulin glargine (LANTUS SOLOSTAR) 100 UNIT/ML Solostar Pen   Other Relevant Orders   POCT glycosylated hemoglobin (Hb A1C) (Completed)   CBC    Comprehensive metabolic panel   Lipid panel     Other   Hyperlipidemia    Patient currently maintained on simvastatin.  Last lipid panel within normal limits recheck today.  Patient also has a follow-up with cardiology      Grief    Patient recently lost mother approximately 3 days ago.  She is 57 years of age battling dementia.  Per patient report was in hospice did inform patient that hospice has great resources for family including grief counseling if he decides.  If this not available through the services she was using can reach out to me and I can facilitate a referral.  Patient denies HI/SI/AVH.       Return in about 14 weeks (around 11/03/2022) for DM recheck.    Romilda Garret, NP

## 2022-07-28 NOTE — Assessment & Plan Note (Signed)
Patient slightly above goal today even on recheck.  He does have follow-up with his cardiologist today.

## 2022-07-28 NOTE — Assessment & Plan Note (Signed)
Patient currently on Lantus 24 units daily, glipizide, semaglutide 2 mg, and metformin 1000 mg twice daily.  Patient A1c had a considerable drop to 8.6 today he has lost approximately 2 pounds we will give patient some more time to work on lifestyle modifications versus increasing insulin at this juncture.  So the lowest his A1c has been thus far follow-up in 3 and half months for recheck

## 2022-07-28 NOTE — Assessment & Plan Note (Signed)
Patient currently maintained on simvastatin.  Last lipid panel within normal limits recheck today.  Patient also has a follow-up with cardiology

## 2022-07-30 ENCOUNTER — Other Ambulatory Visit: Payer: Self-pay | Admitting: Family Medicine

## 2022-07-30 DIAGNOSIS — M25511 Pain in right shoulder: Secondary | ICD-10-CM

## 2022-07-30 DIAGNOSIS — M542 Cervicalgia: Secondary | ICD-10-CM

## 2022-07-30 DIAGNOSIS — E1165 Type 2 diabetes mellitus with hyperglycemia: Secondary | ICD-10-CM

## 2022-09-08 ENCOUNTER — Other Ambulatory Visit: Payer: Self-pay | Admitting: Nurse Practitioner

## 2022-09-08 DIAGNOSIS — E1165 Type 2 diabetes mellitus with hyperglycemia: Secondary | ICD-10-CM

## 2022-09-15 ENCOUNTER — Other Ambulatory Visit: Payer: Self-pay

## 2022-09-15 DIAGNOSIS — I10 Essential (primary) hypertension: Secondary | ICD-10-CM

## 2022-09-15 MED ORDER — CARVEDILOL 25 MG PO TABS: 25.0000 mg | ORAL_TABLET | Freq: Two times a day (BID) | ORAL | 0 refills | Status: DC

## 2022-09-15 NOTE — Telephone Encounter (Signed)
Refill sent for Carvedilol 25 mg

## 2022-10-07 ENCOUNTER — Other Ambulatory Visit: Payer: Self-pay | Admitting: Nurse Practitioner

## 2022-10-07 DIAGNOSIS — E1165 Type 2 diabetes mellitus with hyperglycemia: Secondary | ICD-10-CM

## 2022-10-08 ENCOUNTER — Other Ambulatory Visit: Payer: Self-pay | Admitting: *Deleted

## 2022-10-08 MED ORDER — ISOSORBIDE MONONITRATE ER 30 MG PO TB24: 15.0000 mg | ORAL_TABLET | Freq: Every day | ORAL | 0 refills | Status: DC

## 2022-10-12 NOTE — Progress Notes (Unsigned)
    Alexander Winfree T. Klayten Jolliff, MD, [facility] [place], [address]  Phone: [phone]  FAX: [place]. - 56 y.o. male  MRN [id number]  Date of Birth: 01-07-1966  Date: 10/13/2022  PCP: Michela Pitcher, NP  Referral: Michela Pitcher, NP  No chief complaint on file.  Subjective:   Alexander [name]. is a 56 y.o. very pleasant male patient with There is no height or weight on file to calculate BMI. who presents with the following:  56 yo presents with R shoulder pain:     Review of Systems is noted in the HPI, as appropriate  Objective:   There were no vitals taken for this visit.  GEN: No acute distress; alert,appropriate. PULM: Breathing comfortably in no respiratory distress PSYCH: Normally interactive.   Laboratory and Imaging Data:  Assessment and Plan:   ***

## 2022-10-13 ENCOUNTER — Ambulatory Visit (INDEPENDENT_AMBULATORY_CARE_PROVIDER_SITE_OTHER): Payer: 59 | Admitting: Family Medicine

## 2022-10-13 ENCOUNTER — Encounter: Payer: Self-pay | Admitting: Family Medicine

## 2022-10-13 VITALS — BP 130/90 | HR 95 | Temp 97.9°F | Ht 69.0 in | Wt 235.5 lb

## 2022-10-13 DIAGNOSIS — M25511 Pain in right shoulder: Secondary | ICD-10-CM

## 2022-10-13 DIAGNOSIS — M7551 Bursitis of right shoulder: Secondary | ICD-10-CM

## 2022-10-13 MED ORDER — TRIAMCINOLONE ACETONIDE 40 MG/ML IJ SUSP
40.0000 mg | Freq: Once | INTRAMUSCULAR | Status: AC
  Administered 2022-10-13: 40 mg via INTRA_ARTICULAR

## 2022-11-03 ENCOUNTER — Ambulatory Visit: Payer: 59 | Admitting: Nurse Practitioner

## 2022-11-17 ENCOUNTER — Ambulatory Visit (INDEPENDENT_AMBULATORY_CARE_PROVIDER_SITE_OTHER): Payer: 59 | Admitting: Nurse Practitioner

## 2022-11-17 ENCOUNTER — Other Ambulatory Visit (HOSPITAL_COMMUNITY): Payer: Self-pay

## 2022-11-17 VITALS — BP 130/88 | HR 95 | Temp 97.9°F | Resp 10 | Ht 69.0 in | Wt 237.0 lb

## 2022-11-17 DIAGNOSIS — I5042 Chronic combined systolic (congestive) and diastolic (congestive) heart failure: Secondary | ICD-10-CM | POA: Diagnosis not present

## 2022-11-17 DIAGNOSIS — E1165 Type 2 diabetes mellitus with hyperglycemia: Secondary | ICD-10-CM | POA: Diagnosis not present

## 2022-11-17 DIAGNOSIS — I10 Essential (primary) hypertension: Secondary | ICD-10-CM

## 2022-11-17 DIAGNOSIS — Z794 Long term (current) use of insulin: Secondary | ICD-10-CM

## 2022-11-17 DIAGNOSIS — I428 Other cardiomyopathies: Secondary | ICD-10-CM | POA: Diagnosis not present

## 2022-11-17 DIAGNOSIS — E7800 Pure hypercholesterolemia, unspecified: Secondary | ICD-10-CM

## 2022-11-17 LAB — CBC
HCT: 41.1 % (ref 39.0–52.0)
Hemoglobin: 14.2 g/dL (ref 13.0–17.0)
MCHC: 34.5 g/dL (ref 30.0–36.0)
MCV: 89 fl (ref 78.0–100.0)
Platelets: 237 10*3/uL (ref 150.0–400.0)
RBC: 4.61 Mil/uL (ref 4.22–5.81)
RDW: 14.3 % (ref 11.5–15.5)
WBC: 5 10*3/uL (ref 4.0–10.5)

## 2022-11-17 LAB — COMPREHENSIVE METABOLIC PANEL
ALT: 22 U/L (ref 0–53)
AST: 20 U/L (ref 0–37)
Albumin: 4 g/dL (ref 3.5–5.2)
Alkaline Phosphatase: 49 U/L (ref 39–117)
BUN: 21 mg/dL (ref 6–23)
CO2: 26 mEq/L (ref 19–32)
Calcium: 8.6 mg/dL (ref 8.4–10.5)
Chloride: 101 mEq/L (ref 96–112)
Creatinine, Ser: 0.89 mg/dL (ref 0.40–1.50)
GFR: 95.01 mL/min (ref >60.00)
Glucose, Bld: 207 mg/dL — ABNORMAL HIGH (ref 70–99)
Potassium: 4 mEq/L (ref 3.5–5.1)
Sodium: 136 mEq/L (ref 135–145)
Total Bilirubin: 0.8 mg/dL (ref 0.2–1.2)
Total Protein: 7 g/dL (ref 6.0–8.3)

## 2022-11-17 LAB — MICROALBUMIN / CREATININE URINE RATIO
Creatinine,U: 49.9 mg/dL
Microalb Creat Ratio: 24.7 mg/g (ref 0.0–30.0)
Microalb, Ur: 12.3 mg/dL — ABNORMAL HIGH (ref 0.0–1.9)

## 2022-11-17 LAB — HEMOGLOBIN A1C: Hgb A1c MFr Bld: 8.5 % — ABNORMAL HIGH (ref 4.6–6.5)

## 2022-11-17 MED ORDER — METFORMIN HCL 1000 MG PO TABS: ORAL_TABLET | ORAL | 3 refills | Status: DC

## 2022-11-17 MED ORDER — GLIPIZIDE 10 MG PO TABS: 10.0000 mg | ORAL_TABLET | Freq: Two times a day (BID) | ORAL | 3 refills | Status: DC

## 2022-11-17 MED ORDER — OZEMPIC (2 MG/DOSE) 8 MG/3ML ~~LOC~~ SOPN: PEN_INJECTOR | SUBCUTANEOUS | 3 refills | Status: DC

## 2022-11-17 MED ORDER — FUROSEMIDE 20 MG PO TABS: ORAL_TABLET | ORAL | 3 refills | Status: DC

## 2022-11-17 MED ORDER — LANTUS SOLOSTAR 100 UNIT/ML ~~LOC~~ SOPN: PEN_INJECTOR | SUBCUTANEOUS | 5 refills | Status: DC

## 2022-11-17 NOTE — Assessment & Plan Note (Signed)
Patient currently maintained on Lantus 24 units, glipizide 10 mg twice daily, metformin 1 g twice daily, semaglutide 2 mg weekly.  Patient states he is further been having trouble finding Ozempic 2 mg that is the case we will bump him down to 1 mg if unable to find the 2 mg.  If that still an issue would consider switching him to a different agent altogether like Trulicity or Mounjaro.  Pending A1c

## 2022-11-17 NOTE — Patient Instructions (Signed)
Nice to see you today I will be in touch with labs once I have the results Follow up with me in 3 months, we will do your physical at that office visit.

## 2022-11-17 NOTE — Assessment & Plan Note (Signed)
Continue to work on lifestyle modifications.

## 2022-11-17 NOTE — Assessment & Plan Note (Signed)
Currently controlled on current regimen.  Patient is maintained on carvedilol, isosorbide mononitrate, Entresto, spironolactone, furosemide.

## 2022-11-17 NOTE — Progress Notes (Signed)
Established Patient Office Visit  Subjective   Patient ID: Alexander [name]., male    DOB: 01/01/1966  Age: 56 y.o. MRN: [id number]  Chief Complaint  Patient presents with   Diabetes    Follow up      DM2: Currently maintained on lantus 24u, ozempic '2mg'$ , glipizide and metformin. States taht he is checking sugar once every 2 weeks. States that last week he did no check it as he was travel. States that his sugars are around 1-150 depending on diet.  States that he is trying to avoid sugars. States that he like to eat [employer] peanuts.  Still drinking coffee with splenda Well balanced breakfast and lunch. Dinner will be chicken and fish States that he is walking. He is trying to get back in the gym. States that he will walk in between loads     HTN: Has a cuff and checks it frequently at home.  Patient currently maintained on carvedilol, isosorbide, Entresto, spironolactone, and furosemide he is also concurrently followed by cardiology.  HLD: See above    Review of Systems  Constitutional:  Negative for chills and fever.  Respiratory:  Negative for shortness of breath.   Cardiovascular:  Negative for chest pain.  Gastrointestinal:        BM every other day   Neurological:  Positive for tingling (shoulder). Negative for headaches.      Objective:     BP 130/88   Pulse 95   Temp 97.9 F (36.6 C) (Oral)   Resp 10   Ht '5\' 9"'$  (1.753 m)   Wt 237 lb (107.5 kg)   SpO2 97%   BMI 35.00 kg/m  BP Readings from Last 3 Encounters:  11/17/22 130/88  10/13/22 (!) 130/90  07/28/22 (!) 140/90   Wt Readings from Last 3 Encounters:  11/17/22 237 lb (107.5 kg)  10/13/22 235 lb 8 oz (106.8 kg)  07/28/22 233 lb (105.7 kg)      Physical Exam Vitals and nursing note reviewed.  Constitutional:      Appearance: Normal appearance.  Cardiovascular:     Rate and Rhythm: Normal rate and regular rhythm.     Heart sounds: Normal heart sounds.  Pulmonary:     Effort: Pulmonary effort is  normal.     Breath sounds: Normal breath sounds.  Abdominal:     General: Bowel sounds are normal.  Musculoskeletal:     Right lower leg: No edema.     Left lower leg: No edema.  Feet:     Right foot:     Skin integrity: Dry skin present.     Toenail Condition: Fungal disease present.    Left foot:     Skin integrity: Dry skin present.     Toenail Condition: Fungal disease present. Skin:    General: Skin is warm.  Neurological:     Mental Status: He is alert.     Diabetic Foot Form - Detailed   Diabetic Foot Exam - detailed Diabetic Foot exam was performed with the following findings: Yes 11/17/2022  9:18 AM  Is there swelling or and abnormal foot shape?: No Is there a claw toe deformity?: No Is there elevated skin temparature?: No Pulse Foot Exam completed.: Yes   Right posterior Tibialias: Present Left posterior Tibialias: Present   Right Dorsalis Pedis: Present Left Dorsalis Pedis: Present  Sensory Foot Exam Completed.: Yes Semmes-Weinstein Monofilament Test   Comments: All 10 sites sensation intact bilaterally  No results found for any visits on 11/17/22.    The ASCVD Risk score (Arnett DK, et al., 2019) failed to calculate for the following reasons:   The valid total cholesterol range is 130 to 320 mg/dL    Assessment & Plan:   Problem List Items Addressed This Visit       Cardiovascular and Mediastinum   Chronic combined systolic and diastolic congestive heart failure (HCC) (Chronic)    Currently maintained on Entresto, carvedilol, spironolactone, isosorbide, furosemide.  Continue take medication as prescribed follow-up with cardiology as recommended      Relevant Medications   furosemide (LASIX) 20 MG tablet   Essential hypertension - Primary    Currently controlled on current regimen.  Patient is maintained on carvedilol, isosorbide mononitrate, Entresto, spironolactone, furosemide.      Relevant Medications   furosemide (LASIX) 20 MG  tablet   Other Relevant Orders   CBC   Comprehensive metabolic panel   NICM (nonischemic cardiomyopathy) (HCC)   Relevant Medications   furosemide (LASIX) 20 MG tablet     Endocrine   Uncontrolled type 2 diabetes mellitus with hyperglycemia, with long-term current use of insulin (HCC) (Chronic)    Patient currently maintained on Lantus 24 units, glipizide 10 mg twice daily, metformin 1 g twice daily, semaglutide 2 mg weekly.  Patient states he is further been having trouble finding Ozempic 2 mg that is the case we will bump him down to 1 mg if unable to find the 2 mg.  If that still an issue would consider switching him to a different agent altogether like Trulicity or Mounjaro.  Pending A1c      Relevant Medications   Semaglutide, 2 MG/DOSE, (OZEMPIC, 2 MG/DOSE,) 8 MG/3ML SOPN   glipiZIDE (GLUCOTROL) 10 MG tablet   insulin glargine (LANTUS SOLOSTAR) 100 UNIT/ML Solostar Pen   metFORMIN (GLUCOPHAGE) 1000 MG tablet   Other Relevant Orders   Microalbumin/Creatinine Ratio, Urine   CBC   Comprehensive metabolic panel   Hemoglobin A1c     Other   Pure hypercholesterolemia    Continue to work on lifestyle modifications.      Relevant Medications   furosemide (LASIX) 20 MG tablet    Return in about 3 months (around 02/17/2023) for CPE/DM recheck and labs.    Romilda Garret, NP

## 2022-11-17 NOTE — Assessment & Plan Note (Signed)
Currently maintained on Entresto, carvedilol, spironolactone, isosorbide, furosemide.  Continue take medication as prescribed follow-up with cardiology as recommended

## 2022-11-18 ENCOUNTER — Other Ambulatory Visit: Payer: Self-pay | Admitting: Nurse Practitioner

## 2022-11-18 DIAGNOSIS — E1165 Type 2 diabetes mellitus with hyperglycemia: Secondary | ICD-10-CM

## 2022-11-18 MED ORDER — LANTUS SOLOSTAR 100 UNIT/ML ~~LOC~~ SOPN: PEN_INJECTOR | SUBCUTANEOUS | 5 refills | Status: DC

## 2022-11-27 ENCOUNTER — Other Ambulatory Visit: Payer: Self-pay | Admitting: Family Medicine

## 2022-11-27 DIAGNOSIS — E1165 Type 2 diabetes mellitus with hyperglycemia: Secondary | ICD-10-CM

## 2022-11-27 DIAGNOSIS — M542 Cervicalgia: Secondary | ICD-10-CM

## 2022-11-27 DIAGNOSIS — M25511 Pain in right shoulder: Secondary | ICD-10-CM

## 2022-12-08 NOTE — Telephone Encounter (Signed)
Last office visit 11/17/22 for DM with PCP.  Last refilled 07/30/22 for #60 with 3 refills by Dr. Lorelei Pont for Subacromial bursitis of right shoulder joint  No future appointments with PCP or Dr. Lorelei Pont.  Refill?

## 2022-12-16 ENCOUNTER — Other Ambulatory Visit: Payer: Self-pay

## 2022-12-16 DIAGNOSIS — I10 Essential (primary) hypertension: Secondary | ICD-10-CM

## 2022-12-16 MED ORDER — CARVEDILOL 25 MG PO TABS: 25.0000 mg | ORAL_TABLET | Freq: Two times a day (BID) | ORAL | 0 refills | Status: DC

## 2022-12-19 ENCOUNTER — Other Ambulatory Visit: Payer: Self-pay

## 2022-12-19 MED ORDER — ISOSORBIDE MONONITRATE ER 30 MG PO TB24: 15.0000 mg | ORAL_TABLET | Freq: Every day | ORAL | 0 refills | Status: DC

## 2022-12-25 ENCOUNTER — Telehealth: Payer: Self-pay | Admitting: Physical Therapy

## 2022-12-25 NOTE — Telephone Encounter (Signed)
Received call from Alvera Singh attorney, received my fax stating no records found. He stated he needs physical therapy notes.I faxed P.T. notes [phone].

## 2023-01-30 ENCOUNTER — Other Ambulatory Visit: Payer: 59

## 2023-02-01 ENCOUNTER — Other Ambulatory Visit: Payer: Self-pay | Admitting: Physician Assistant

## 2023-02-02 ENCOUNTER — Ambulatory Visit: Payer: 59 | Admitting: Cardiology

## 2023-02-17 ENCOUNTER — Other Ambulatory Visit: Payer: Self-pay

## 2023-02-17 DIAGNOSIS — I10 Essential (primary) hypertension: Secondary | ICD-10-CM

## 2023-02-17 MED ORDER — ISOSORBIDE MONONITRATE ER 30 MG PO TB24: 15.0000 mg | ORAL_TABLET | Freq: Every day | ORAL | 0 refills | Status: DC

## 2023-02-17 MED ORDER — SIMVASTATIN 40 MG PO TABS: 40.0000 mg | ORAL_TABLET | Freq: Every day | ORAL | 0 refills | Status: DC

## 2023-02-17 MED ORDER — CARVEDILOL 25 MG PO TABS: 25.0000 mg | ORAL_TABLET | Freq: Two times a day (BID) | ORAL | 0 refills | Status: DC

## 2023-02-27 ENCOUNTER — Ambulatory Visit (INDEPENDENT_AMBULATORY_CARE_PROVIDER_SITE_OTHER): Payer: 59 | Admitting: Nurse Practitioner

## 2023-02-27 ENCOUNTER — Encounter: Payer: Self-pay | Admitting: Nurse Practitioner

## 2023-02-27 VITALS — BP 134/78 | HR 90 | Temp 98.4°F | Resp 16 | Ht 69.0 in | Wt 240.0 lb

## 2023-02-27 DIAGNOSIS — E1165 Type 2 diabetes mellitus with hyperglycemia: Secondary | ICD-10-CM | POA: Diagnosis not present

## 2023-02-27 DIAGNOSIS — Z23 Encounter for immunization: Secondary | ICD-10-CM

## 2023-02-27 DIAGNOSIS — Z0000 Encounter for general adult medical examination without abnormal findings: Secondary | ICD-10-CM | POA: Insufficient documentation

## 2023-02-27 DIAGNOSIS — E7800 Pure hypercholesterolemia, unspecified: Secondary | ICD-10-CM | POA: Diagnosis not present

## 2023-02-27 DIAGNOSIS — Z794 Long term (current) use of insulin: Secondary | ICD-10-CM | POA: Diagnosis not present

## 2023-02-27 DIAGNOSIS — I10 Essential (primary) hypertension: Secondary | ICD-10-CM | POA: Diagnosis not present

## 2023-02-27 DIAGNOSIS — I5042 Chronic combined systolic (congestive) and diastolic (congestive) heart failure: Secondary | ICD-10-CM

## 2023-02-27 DIAGNOSIS — E669 Obesity, unspecified: Secondary | ICD-10-CM

## 2023-02-27 DIAGNOSIS — Z8669 Personal history of other diseases of the nervous system and sense organs: Secondary | ICD-10-CM

## 2023-02-27 LAB — POCT GLYCOSYLATED HEMOGLOBIN (HGB A1C): Hemoglobin A1C: 6.9 % — AB (ref 4.0–5.6)

## 2023-02-27 NOTE — Assessment & Plan Note (Signed)
Discussed age-appropriate immunizations and screening exams.  Patient is up-to-date on tetanus.  Update first shingles vaccine today.  Patient is up-to-date on CRC screening.  Will update PSA blood draw at next office visit will get labs.  Patient was given information at discharge about preventative healthcare maintenance with anticipatory guidance

## 2023-02-27 NOTE — Assessment & Plan Note (Signed)
Patient currently maintained on simvastatin 40 mg.  Continue working on lifestyle modifications

## 2023-02-27 NOTE — Assessment & Plan Note (Signed)
Patient had an appreciable drop in A1c from low eights to 6.9% today.  Patient will continue his Lantus 26 units daily along with metformin 1000 mg twice daily, glipizide 10 mg twice daily, semaglutide 2 mg once weekly.  He will also continue working on lifestyle modifications

## 2023-02-27 NOTE — Assessment & Plan Note (Signed)
Patient is followed by Dr. Garen Lah.  Patient is currently on Entresto continue taking medication prescribed and follow-up with cardiology as recommended

## 2023-02-27 NOTE — Assessment & Plan Note (Signed)
Discussed lifestyle modifications inclusive of exercise.  Patient will try to increase the amount of walking he is doing

## 2023-02-27 NOTE — Progress Notes (Signed)
Established Patient Office Visit  Subjective   Patient ID: Alexander [name]., male    DOB: December 08, 1966  Age: 57 y.o. MRN: [id number]  Chief Complaint  Patient presents with   Diabetes   Annual Exam      for complete physical and follow up of chronic conditions.  DM2: Patient is currently on Ozempic 2 mg weekly, metformin, glipizide, Lantus 26 units at bedtime.  Patient's A1c today 6.9% States that he was on vacation. States that he is checking glucose once a week 100-120  HTN: Patient currently maintained on carvedilol, isosorbide, spironolactone  CHF: Patient currently followed by Dr. Aaron Edelman through cardiology.  Last office 07/28/2022.  Patient is currently on Entresto  Immunizations: -Tetanus: Completed in 2013, get at local pharmacy -Influenza:  -Shingles: update today -Pneumonia: Too young  Diet: [place]. States that he is doing 3 meals a day. States that will do egg, bacon and toast for breakfast. If he missies lunch he will do fruit. States that he is trying water and some coffee. Soda once week   Exercise: No regular exercise. Walking 10-15 mins a day   Eye exam: Completes annually. Done in April. Wears glasses  Dental exam: Completes semi-annually    Colonoscopy: Completed in 09/02/2021 by Wilfrid Lund, MD.  Recall 3 years patient will be due 2025 Lung Cancer Screening: Does not qualify  PSA: Due  Sleep: goes to bed aroun 11-12 and will get up around 6-7 am. Feels rested. Does snore       Review of Systems  Constitutional:  Negative for chills and fever.  Respiratory:  Negative for shortness of breath.   Cardiovascular:  Positive for leg swelling (left foot). Negative for chest pain.  Gastrointestinal:  Negative for abdominal pain, blood in stool, constipation, diarrhea, nausea and vomiting.       BM daily   Genitourinary:  Negative for dysuria and hematuria.       Nocturia x1  Neurological:  Negative for tingling and headaches.  Psychiatric/Behavioral:   Negative for hallucinations and suicidal ideas.       Objective:     BP 134/78   Pulse 90   Temp 98.4 F (36.9 C)   Resp 16   Ht '5\' 9"'$  (1.753 m)   Wt 240 lb (108.9 kg)   SpO2 98%   BMI 35.44 kg/m  BP Readings from Last 3 Encounters:  02/27/23 134/78  11/17/22 130/88  10/13/22 (!) 130/90   Wt Readings from Last 3 Encounters:  02/27/23 240 lb (108.9 kg)  11/17/22 237 lb (107.5 kg)  10/13/22 235 lb 8 oz (106.8 kg)      Physical Exam Vitals and nursing note reviewed.  Constitutional:      Appearance: Normal appearance.  HENT:     Right Ear: Tympanic membrane, ear canal and external ear normal.     Left Ear: Tympanic membrane, ear canal and external ear normal.     Mouth/Throat:     Mouth: Mucous membranes are moist.     Pharynx: Oropharynx is clear.  Eyes:     Extraocular Movements: Extraocular movements intact.     Pupils: Pupils are equal, round, and reactive to light.  Cardiovascular:     Rate and Rhythm: Normal rate and regular rhythm.     Pulses: Normal pulses.     Heart sounds: Normal heart sounds.  Pulmonary:     Effort: Pulmonary effort is normal.     Breath sounds: Normal breath sounds.  Abdominal:  General: Bowel sounds are normal. There is no distension.     Palpations: There is no mass.     Tenderness: There is no abdominal tenderness.     Hernia: No hernia is present.  Musculoskeletal:     Right lower leg: No edema.     Left lower leg: No edema.  Lymphadenopathy:     Cervical: No cervical adenopathy.  Skin:    General: Skin is warm.  Neurological:     General: No focal deficit present.     Mental Status: He is alert.     Deep Tendon Reflexes:     Reflex Scores:      Bicep reflexes are 2+ on the right side and 2+ on the left side.      Patellar reflexes are 2+ on the right side and 2+ on the left side.    Comments: Bilateral upper and lower extremity strength 5/5  Psychiatric:        Mood and Affect: Mood normal.        Behavior:  Behavior normal.        Thought Content: Thought content normal.        Judgment: Judgment normal.    Diabetic Foot Form - Detailed   Diabetic Foot Exam - detailed Visual Foot Exam completed.: Yes  Is there swelling or and abnormal foot shape?: No Is there a claw toe deformity?: No Is there elevated skin temparature?: No Pulse Foot Exam completed.: Yes   Right posterior Tibialias: Present Left posterior Tibialias: Present   Right Dorsalis Pedis: Present Left Dorsalis Pedis: Present  Sensory Foot Exam Completed.: Yes Semmes-Weinstein Monofilament Test   Comments: All 10 sites tested bilaterally intact Some dry skin on the plantar surface of the right foot        Results for orders placed or performed in visit on 02/27/23  POCT glycosylated hemoglobin (Hb A1C)  Result Value Ref Range   Hemoglobin A1C 6.9 (A) 4.0 - 5.6 %   HbA1c POC (<> result, manual entry)     HbA1c, POC (prediabetic range)     HbA1c, POC (controlled diabetic range)        The ASCVD Risk score (Arnett DK, et al., 2019) failed to calculate for the following reasons:   The valid total cholesterol range is 130 to 320 mg/dL    Assessment & Plan:   Problem List Items Addressed This Visit       Cardiovascular and Mediastinum   Chronic combined systolic and diastolic congestive heart failure (HCC) (Chronic)    Patient is followed by Dr. Garen Lah.  Patient is currently on Entresto continue taking medication prescribed and follow-up with cardiology as recommended      Essential hypertension    Patient currently maintained on carvedilol isosorbide and spironolactone.  He is followed by cardiology.  Continue following with cardiology as recommended        Endocrine   Uncontrolled type 2 diabetes mellitus with hyperglycemia, with long-term current use of insulin ([place]) - Primary (Chronic)    Patient had an appreciable drop in A1c from low eights to 6.9% today.  Patient will continue his Lantus 26 units  daily along with metformin 1000 mg twice daily, glipizide 10 mg twice daily, semaglutide 2 mg once weekly.  He will also continue working on lifestyle modifications      Relevant Orders   POCT glycosylated hemoglobin (Hb A1C) (Completed)     Other   Obesity (BMI 30-39.9)    Discussed lifestyle  modifications inclusive of exercise.  Patient will try to increase the amount of walking he is doing      Pure hypercholesterolemia    Patient currently maintained on simvastatin 40 mg.  Continue working on lifestyle modifications      Preventative health care    Discussed age-appropriate immunizations and screening exams.  Patient is up-to-date on tetanus.  Update first shingles vaccine today.  Patient is up-to-date on CRC screening.  Will update PSA blood draw at next office visit will get labs.  Patient was given information at discharge about preventative healthcare maintenance with anticipatory guidance      Other Visit Diagnoses     Need for shingles vaccine       Relevant Orders   Zoster Recombinant (Shingrix )       Return in about 3 months (around 05/30/2023) for DM recheck.    Romilda Garret, NP

## 2023-02-27 NOTE — Assessment & Plan Note (Signed)
Patient currently maintained on carvedilol isosorbide and spironolactone.  He is followed by cardiology.  Continue following with cardiology as recommended

## 2023-02-27 NOTE — Patient Instructions (Signed)
Nice to see you today We gave you the first shingles vaccine today We will do the second one at the nxt office visit I want to see you in 3 months for diabetes recheck

## 2023-03-12 ENCOUNTER — Encounter: Payer: Self-pay | Admitting: Cardiology
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
## 2023-03-20 ENCOUNTER — Ambulatory Visit: Payer: 59

## 2023-03-23 ENCOUNTER — Ambulatory Visit: Payer: 59 | Admitting: Cardiology

## 2023-03-26 ENCOUNTER — Other Ambulatory Visit: Payer: Self-pay

## 2023-03-26 DIAGNOSIS — I10 Essential (primary) hypertension: Secondary | ICD-10-CM

## 2023-03-26 MED ORDER — SIMVASTATIN 40 MG PO TABS: 40.0000 mg | ORAL_TABLET | Freq: Every day | ORAL | 0 refills | Status: DC

## 2023-03-26 MED ORDER — CARVEDILOL 25 MG PO TABS: 25.0000 mg | ORAL_TABLET | Freq: Two times a day (BID) | ORAL | 0 refills | Status: DC

## 2023-04-06 ENCOUNTER — Other Ambulatory Visit: Payer: Self-pay | Admitting: *Deleted

## 2023-04-06 LAB — HM DIABETES EYE EXAM

## 2023-04-06 MED ORDER — ENTRESTO 97-103 MG PO TABS: 1.0000 | ORAL_TABLET | Freq: Two times a day (BID) | ORAL | 1 refills | Status: DC

## 2023-04-09 ENCOUNTER — Other Ambulatory Visit (HOSPITAL_COMMUNITY): Payer: Self-pay

## 2023-04-27 ENCOUNTER — Ambulatory Visit: Payer: 59 | Attending: Cardiology

## 2023-04-27 DIAGNOSIS — I428 Other cardiomyopathies: Secondary | ICD-10-CM

## 2023-04-27 LAB — ECHOCARDIOGRAM LIMITED
AR max vel: 2.29 cm2
AV Area VTI: 2.17 cm2
AV Area mean vel: 2.3 cm2
AV Mean grad: 5 mmHg
AV Peak grad: 9 mmHg
Ao pk vel: 1.5 m/s
Calc EF: 48.6 %
S' Lateral: 4.7 cm
Single Plane A2C EF: 39.4 %
Single Plane A4C EF: 57.7 %

## 2023-04-27 MED ORDER — PERFLUTREN LIPID MICROSPHERE
1.0000 mL | INTRAVENOUS | Status: AC | PRN
  Administered 2023-04-27: 2 mL via INTRAVENOUS

## 2023-05-04 ENCOUNTER — Encounter: Payer: Self-pay | Admitting: Cardiology

## 2023-05-04 ENCOUNTER — Ambulatory Visit: Payer: 59 | Attending: Cardiology | Admitting: Cardiology

## 2023-05-04 ENCOUNTER — Other Ambulatory Visit
Admission: RE | Admit: 2023-05-04 | Discharge: 2023-05-04 | Disposition: A | Discharge status: Home / Self Care | Payer: 59 | Attending: Cardiology | Admitting: Cardiology

## 2023-05-04 VITALS — BP 110/62 | HR 93 | Ht 69.0 in | Wt 236.0 lb

## 2023-05-04 DIAGNOSIS — I428 Other cardiomyopathies: Secondary | ICD-10-CM

## 2023-05-04 DIAGNOSIS — E7800 Pure hypercholesterolemia, unspecified: Secondary | ICD-10-CM | POA: Diagnosis not present

## 2023-05-04 DIAGNOSIS — I10 Essential (primary) hypertension: Secondary | ICD-10-CM

## 2023-05-04 LAB — HEMOGLOBIN AND HEMATOCRIT, BLOOD
HCT: 41.5 % (ref 39.0–52.0)
Hemoglobin: 13.8 g/dL (ref 13.0–17.0)
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
## 2023-05-04 NOTE — Patient Instructions (Signed)
Medication Instructions:   Your physician recommends that you continue on your current medications as directed. Please refer to the Current Medication list given to you today.  *If you need a refill on your cardiac medications before your next appointment, please call your pharmacy*   Lab Work:  Your physician recommends you go to the medical mall for labs.   If you have labs (blood work) drawn today and your tests are completely normal, you will receive your results only by: MyChart Message (if you have MyChart) OR A paper copy in the mail If you have any lab test that is abnormal or we need to change your treatment, we will call you to review the results.   Testing/Procedures:    You are scheduled for Cardiac MRI on ______________. Please arrive for your appointment at ______________ ( arrive 30-45 minutes prior to test start time). ?  [facility] [address] [place], [place] [address] [phone] Please take advantage of the free valet parking available at the MAIN entrance. Proceed to [facility] registration for check-in (first floor).  Magnetic resonance imaging (MRI) is a painless test that produces images of the inside of the body without using Xrays.  During an MRI, strong magnets and radio waves work together in a Data processing manager to form detailed images.   MRI images may provide more details about a medical condition than X-rays, CT scans, and ultrasounds can provide.  You may be given earphones to listen for instructions.  You may eat a light breakfast and take medications as ordered with the exception of furosemide, hydrochlorothiazide, or spironolactone(fluid pill, other). Please avoid stimulants for 12 hr prior to test. (Ie. Caffeine, nicotine, chocolate, or antihistamine medications)  If a contrast material will be used, an IV will be inserted into one of your veins. Contrast material will be injected into your IV. It will leave your body  through your urine within a day. You may be told to drink plenty of fluids to help flush the contrast material out of your system.  You will be asked to remove all metal, including: Watch, jewelry, and other metal objects including hearing aids, hair pieces and dentures. Also wearable glucose monitoring systems (ie. Freestyle Libre and Omnipods) (Braces and fillings normally are not a problem.)   TEST WILL TAKE APPROXIMATELY 1 HOUR  PLEASE NOTIFY SCHEDULING AT LEAST 24 HOURS IN ADVANCE IF YOU ARE UNABLE TO KEEP YOUR APPOINTMENT. [phone]  Please call Rockwell Alexandria, cardiac imaging nurse navigator with any questions/concerns. Rockwell Alexandria RN Navigator Cardiac Imaging Larey Brick RN Navigator Cardiac Imaging [facility] [phone] Office     Follow-Up: At [facility], you and your health needs are our priority.  As part of our continuing mission to provide you with exceptional heart care, we have created designated Provider Care Teams.  These Care Teams include your primary Cardiologist (physician) and Advanced Practice Providers (APPs -  Physician Assistants and Nurse Practitioners) who all work together to provide you with the care you need, when you need it.  We recommend signing up for the patient portal called "MyChart".  Sign up information is provided on this After Visit Summary.  MyChart is used to connect with patients for Virtual Visits (Telemedicine).  Patients are able to view lab/test results, encounter notes, upcoming appointments, etc.  Non-urgent messages can be sent to your provider as well.   To learn more about what you can do with MyChart, go to [email or web address].  Your next appointment:   6 month(s)  Provider:   You may see Debbe Odea, MD ONLY   Other Instructions  [facility] [facility] at [facility]  [address], [address] [place],  [place]  [address]  Main:  [phone]

## 2023-05-07 ENCOUNTER — Other Ambulatory Visit: Payer: Self-pay

## 2023-05-07 MED ORDER — ISOSORBIDE MONONITRATE ER 30 MG PO TB24: 15.0000 mg | ORAL_TABLET | Freq: Every day | ORAL | 1 refills | Status: DC

## 2023-05-08 ENCOUNTER — Other Ambulatory Visit: Payer: Self-pay

## 2023-05-08 DIAGNOSIS — I10 Essential (primary) hypertension: Secondary | ICD-10-CM

## 2023-05-08 MED ORDER — SIMVASTATIN 40 MG PO TABS: 40.0000 mg | ORAL_TABLET | Freq: Every day | ORAL | 1 refills | Status: DC

## 2023-05-08 MED ORDER — CARVEDILOL 25 MG PO TABS
25.0000 mg | ORAL_TABLET | Freq: Two times a day (BID) | ORAL | 1 refills | Status: DC
Start: 2023-05-08 — End: 2023-08-27

## 2023-05-26 ENCOUNTER — Telehealth (HOSPITAL_COMMUNITY): Payer: Self-pay | Admitting: Emergency Medicine

## 2023-05-26 NOTE — Telephone Encounter (Signed)
Reaching out to patient to offer assistance regarding upcoming cardiac imaging study; pt verbalizes understanding of appt date/time, parking situation and where to check in, pre-test NPO status and medications ordered, and verified current allergies; name and call back number provided for further questions should they arise Kaylyne Axton RN Navigator Cardiac Imaging [facility] [phone] office [phone] cell 

## 2023-05-27 ENCOUNTER — Ambulatory Visit
Admission: RE | Admit: 2023-05-27 | Discharge: 2023-05-27 | Disposition: A | Discharge status: Home / Self Care | Payer: 59 | Source: Ambulatory Visit | Attending: Cardiology | Admitting: Cardiology

## 2023-05-27 ENCOUNTER — Other Ambulatory Visit: Payer: Self-pay | Admitting: Cardiology

## 2023-05-27 DIAGNOSIS — I428 Other cardiomyopathies: Secondary | ICD-10-CM | POA: Diagnosis present

## 2023-05-27 MED ORDER — GADOBUTROL 1 MMOL/ML IV SOLN
13.0000 mL | Freq: Once | INTRAVENOUS | Status: AC | PRN
  Administered 2023-05-27: 13 mL via INTRAVENOUS

## 2023-06-23 ENCOUNTER — Telehealth: Payer: Self-pay | Admitting: Cardiology

## 2023-06-23 MED ORDER — ENTRESTO 97-103 MG PO TABS: 1.0000 | ORAL_TABLET | Freq: Two times a day (BID) | ORAL | 3 refills | Status: DC

## 2023-06-23 NOTE — Telephone Encounter (Signed)
*  STAT* If patient is at the pharmacy, call can be transferred to refill team.   1. Which medications need to be refilled? (please list name of each medication and dose if known)  sacubitril-valsartan (ENTRESTO) 97-103 MG  2. Which pharmacy/location (including street and city if local pharmacy) is medication to be sent to? [id number] - [place], [place] - [address] OF [address]  3. Do they need a 30 day or 90 day supply?  30 day supply

## 2023-07-06 ENCOUNTER — Ambulatory Visit (HOSPITAL_BASED_OUTPATIENT_CLINIC_OR_DEPARTMENT_OTHER): Payer: 59 | Admitting: Internal Medicine

## 2023-07-06 ENCOUNTER — Other Ambulatory Visit
Admission: RE | Admit: 2023-07-06 | Discharge: 2023-07-06 | Disposition: A | Discharge status: Home / Self Care | Payer: 59 | Source: Ambulatory Visit | Attending: Internal Medicine | Admitting: Internal Medicine

## 2023-07-06 VITALS — BP 149/103 | HR 92 | Ht 69.0 in | Wt 238.0 lb

## 2023-07-06 DIAGNOSIS — I428 Other cardiomyopathies: Secondary | ICD-10-CM | POA: Diagnosis not present

## 2023-07-06 DIAGNOSIS — I5020 Unspecified systolic (congestive) heart failure: Secondary | ICD-10-CM | POA: Insufficient documentation

## 2023-07-06 DIAGNOSIS — E119 Type 2 diabetes mellitus without complications: Secondary | ICD-10-CM

## 2023-07-06 DIAGNOSIS — R0683 Snoring: Secondary | ICD-10-CM | POA: Diagnosis not present

## 2023-07-06 DIAGNOSIS — Z794 Long term (current) use of insulin: Secondary | ICD-10-CM

## 2023-07-06 NOTE — Addendum Note (Signed)
Addended by: Electa Sniff on: 07/06/2023 04:22 PM   Modules accepted: Orders

## 2023-07-06 NOTE — Progress Notes (Signed)
Height:  69 inches    Weight: 238 lb BMI: 35.15  Today's Date: 07/06/23  STOP BANG RISK ASSESSMENT S (snore) Have you been told that you snore?     YES   T (tired) Are you often tired, fatigued, or sleepy during the day?   NO  O (obstruction) Do you stop breathing, choke, or gasp during sleep? NO   P (pressure) Do you have or are you being treated for high blood pressure? YES   B (BMI) Is your body index greater than 35 kg/m? YES   A (age) Are you 58 years old or older? YES   N (neck) Do you have a neck circumference greater than 16 inches?   NO   G (gender) Are you a male? YES   TOTAL STOP/BANG "YES" ANSWERS 5                                                                       For Office Use Only              Procedure Order Form    YES to 3+ Stop Bang questions OR two clinical symptoms - patient qualifies for WatchPAT (CPT 95800)      Clinical Notes: Will consult Sleep Specialist and refer for management of therapy due to patient increased risk of Sleep Apnea. Ordering a sleep study due to the following two clinical symptoms:  Loud snoring R06.83 / History of high blood pressure R03.0 /

## 2023-07-06 NOTE — Patient Instructions (Addendum)
  Lab Work:   Designer, jewellery has been done, this has to be sent to [place] to be processed and can take 1-2 weeks to get results back.  We will let you know the results.   Testing/Procedures:  Your provider has recommended that you have a home sleep study ([facility] Test).  We have provided you with the equipment in our office today. Please go ahead and download the app. DO NOT OPEN OR TAMPER WITH THE BOX UNTIL WE ADVISE YOU TO DO SO. Once insurance has approved the test our office will call you with PIN number and approval to proceed with testing. Once you have completed the test you just dispose of the equipment, the information is automatically uploaded to [place] via blue-tooth technology. If your test is positive for sleep apnea and you need a home CPAP machine you will be contacted by Dr Norris Cross office [facility]) to set this up.     Special Instructions // Education:  Do the following things EVERYDAY: Weigh yourself in the morning before breakfast. Write it down and keep it in a log. Take your medicines as prescribed Eat low salt foods--Limit salt (sodium) to 2000 mg per day.  Stay as active as you can everyday Limit all fluids for the day to less than 2 liters    Please check your blood pressure a few times a week and bring a log on your next visit.   Follow-Up in: 2 months with Dr. Gala Romney.    If you have any questions or concerns before your next appointment please send [place] a message through [place] or call our office at [phone] Monday-Friday 8 am-5 pm.   If you have an urgent need after hours on the weekend please call your Primary Cardiologist or the Advanced Heart Failure Clinic in [place] at [phone].

## 2023-07-06 NOTE — Progress Notes (Addendum)
ADVANCED HF CLINIC CONSULT NOTE  Referring Physician: Eden Emms, NP Primary Care: Alexander Emms, NP Primary Cardiologist: Alexander Odea, MD  HPI:  Alexander [name]. is a 57 y.o. male with a hx of DM2, systolic HF due to nonischemic cardiomyopathy, initial EF 30 to 35%, (last EF 35-40% ),HTN who is referred by Dr. Azucena [name] for further evaluation of his HF.   Diagnosed with acute HF in 2021. Cath with no CAD. ECHO EF 30-35%   cMRI 6/24: LVEF 37% RVEF 42% There is a small septal, midwall late gadolinium enhancement in the left ventricular myocardium at the RV insertion points. Otherwise no significant findings   Prior notes Echo 10/2021, EF 35 to 40%. Echo 04/03/2020, EF 30 to 35%. Left heart catheter 04/30/2020 luminal irregularities, no evidence of obstructive coronary artery disease Echo 09/21/2020 EF 45 to 50%.  Has been doing well. Owns a trucking company. Walks 1-2 mile per day. Says he snores. Was tested for sleeps apnea years ago and was positive. Compliant with meds. BP typically 110/70.No edema, CP, orthopnea or PND. Drinks ~2 drinks. Both dad and brother had weak hearts. Dad had MI. Brother has ICD. DM2 well controlled A1c 6.8%    Review of Systems: [y] = yes, [ ]  = no   General: Weight gain [ ] ; Weight loss [ ] ; Anorexia [ ] ; Fatigue [ ] ; Fever [ ] ; Chills [ ] ; Weakness [ ]   Cardiac: Chest pain/pressure [ ] ; Resting SOB [ ] ; Exertional SOB [ ] ; Orthopnea [ ] ; Pedal Edema [ ] ; Palpitations [ ] ; Syncope [ ] ; Presyncope [ ] ; Paroxysmal nocturnal dyspnea[ ]   Pulmonary: Cough [ ] ; Wheezing[ ] ; Hemoptysis[ ] ; Sputum [ ] ; Snoring [ ]   GI: Vomiting[ ] ; Dysphagia[ ] ; Melena[ ] ; Hematochezia [ ] ; Heartburn[ ] ; Abdominal pain [ ] ; Constipation [ ] ; Diarrhea [ ] ; BRBPR [ ]   GU: Hematuria[ ] ; Dysuria [ ] ; Nocturia[ ]   Vascular: Pain in legs with walking [ ] ; Pain in feet with lying flat [ ] ; Non-healing sores [ ] ; Stroke [ ] ; TIA [ ] ; Slurred speech [ ] ;  Neuro: Headaches[ ] ;  Vertigo[ ] ; Seizures[ ] ; Paresthesias[ ] ;Blurred vision [ ] ; Diplopia [ ] ; Vision changes [ ]   Ortho/Skin: Arthritis [ ] ; Joint pain [ ] ; Muscle pain [ ] ; Joint swelling [ ] ; Back Pain [ ] ; Rash [ ]   Psych: Depression[ ] ; Anxiety[ ]   Heme: Bleeding problems [ ] ; Clotting disorders [ ] ; Anemia [ ]   Endocrine: Diabetes Alexander [name] ]; Thyroid dysfunction[ ]    Past Medical History:  Diagnosis Date   Anemia    Blood transfusion without reported diagnosis    years ago per pt   CHF (congestive heart failure) (HCC)    Diabetes mellitus without complication (HCC)    GERD (gastroesophageal reflux disease)    occ TUMS   HFrEF (heart failure with reduced ejection fraction) (HCC) 03/2020   Echo 03/2020 LVEF 30-35%-- 09-21-2020- 45 50% EF   Hyperlipidemia    Hypertension     Current Outpatient Medications  Medication Sig Dispense Refill   albuterol (VENTOLIN HFA) 108 (90 Base) MCG/ACT inhaler Inhale 2 puffs into the lungs every 4 (four) hours as needed for wheezing or shortness of breath.     aspirin 81 MG tablet Take 1 tablet (81 mg total) by mouth every other day. 30 tablet 11   carvedilol (COREG) 25 MG tablet Take 1 tablet (25 mg total) by mouth 2 (two) times daily. 180 tablet 1  clotrimazole (LOTRIMIN) 1 % cream Apply 1 application topically 2 (two) times daily. 30 g 0   furosemide (LASIX) 20 MG tablet TAKE 1 TABLET BY MOUTH DAILY 90 tablet 3   glipiZIDE (GLUCOTROL) 10 MG tablet Take 1 tablet (10 mg total) by mouth 2 (two) times daily before a meal. 180 tablet 3   insulin glargine (LANTUS SOLOSTAR) 100 UNIT/ML Solostar Pen INJECT SUBCUTANEOUSLY 26 UNITS  AT BEDTIME 30 mL 5   isosorbide mononitrate (IMDUR) 30 MG 24 hr tablet Take 0.5 tablets (15 mg total) by mouth daily. No additional refill until completed office visit 45 tablet 1   metFORMIN (GLUCOPHAGE) 1000 MG tablet TAKE 1 TABLET BY MOUTH  TWICE DAILY WITH MEALS 180 tablet 3   methocarbamol (ROBAXIN) 500 MG tablet Take 500 mg by mouth every 8  (eight) hours as needed.     Multiple Vitamin (MULTIVITAMIN) tablet Take 1 tablet by mouth daily.     omeprazole (PRILOSEC OTC) 20 MG tablet Take 1 tablet (20 mg total) by mouth daily.     sacubitril-valsartan (ENTRESTO) 97-103 MG Take 1 tablet by mouth 2 (two) times daily. 60 tablet 3   Semaglutide, 2 MG/DOSE, (OZEMPIC, 2 MG/DOSE,) 8 MG/3ML SOPN INJECT SUBCUTANEOUSLY 2 MG  WEEKLY AS DIRECTED 9 mL 3   simvastatin (ZOCOR) 40 MG tablet Take 1 tablet (40 mg total) by mouth daily. 90 tablet 1   spironolactone (ALDACTONE) 25 MG tablet Take 1 tablet (25 mg total) by mouth daily. 90 tablet 2   Insulin Pen Needle 32G X 4 MM MISC Use 1x a day 100 each 0   naproxen (NAPROSYN) 500 MG tablet TAKE 1 TABLET(500 MG) BY MOUTH TWICE DAILY AS NEEDED (Patient not taking: Reported on 07/06/2023) 60 tablet 3   No current facility-administered medications for this visit.    No Known Allergies    Social History   Socioeconomic History   Marital status: Married    Spouse name: Alexander [name]   Number of children: 1   Years of education: college   Highest education level: Not on file  Occupational History   Occupation: Self employed  Tobacco Use   Smoking status: Never   Smokeless tobacco: Never  Substance and Sexual Activity   Alcohol use: Yes    Alcohol/week: 2.0 standard drinks of alcohol    Types: 2 Standard drinks or equivalent per week    Comment: social    Drug use: No   Sexual activity: Yes    Partners: Female  Other Topics Concern   Not on file  Social History Narrative   Lives with wife.     Social Determinants of Health   Financial Resource Strain: Not on file  Food Insecurity: Not on file  Transportation Needs: Not on file  Physical Activity: Not on file  Stress: Not on file  Social Connections: Unknown (05/05/2022)   [facility]   Social Network    Social Network: Not on file  Intimate Partner Violence: Unknown (03/27/2022)   Received from [facility]   HITS     Physically Hurt: Not on file    Insult or Talk Down To: Not on file    Threaten Physical Harm: Not on file    Scream or Curse: Not on file      Family History  Problem Relation Age of Onset   Alzheimer's disease Mother    Diabetes Father    Heart attack Father 4       Died age 76  MI   Bradycardia Brother        Pace maker 2021   Colon cancer Neg Hx    Colon polyps Neg Hx    Esophageal cancer Neg Hx    Rectal cancer Neg Hx    Stomach cancer Neg Hx     Vitals:   07/06/23 1110  BP: (!) 149/103  Pulse: 92  SpO2: 100%  Weight: 238 lb (108 kg)  Height: 5\' 9"  (1.753 m)    PHYSICAL EXAM: General:  Well appearing. No respiratory difficulty HEENT: normal Neck: supple. no JVD. Carotids 2+ bilat; no bruits. No lymphadenopathy or thryomegaly appreciated. Cor: PMI nondisplaced. Regular rate & rhythm. No rubs, gallops or murmurs. Lungs: clear Abdomen: soft, nontender, nondistended. No hepatosplenomegaly. No bruits or masses. Good bowel sounds. Extremities: no cyanosis, clubbing, rash, edema Neuro: alert & oriented x 3, cranial nerves grossly intact. moves all 4 extremities w/o difficulty. Affect pleasant.  ECG: 05/04/23 NSR 92 narrow QRS No ST-T wave abnormalities. Personally reviewed   ASSESSMENT & PLAN:  1. Chronic systolic HF due to NICM - Diagnosed in 2021.  - Cath 2021 with no CAD.  - ECHO 2021 EF 30-35%  - cMRI 6/24: LVEF 37% RVEF 42% There is a small septal, midwall late gadolinium enhancement in the left ventricular myocardium at the RV insertion points. Otherwise no significant findings - NYHA II - Volume status ok  - Continue carvedilol 25 bid - Continue Entresto 97/103 bid - Continue spiro 25 - Wil need SGLT2i. Given multiple other DM2 agents I asked him to discuss [place] or [place] with his PCP.  - I am concerned for possible genetic CM with father and brother both having HF (vs OSA/HTN). Will send gene testing - Check home sleep study and keep BP log  2.  DM2 - per primary - see discussion above re: SGLT2i  3. Obesity - on GLP1RA  4. HTN - Reports good control but elevated here after not taking meds - Keep BP log  5. Snoring - check HST  Total time spent 50 minutes. Over half that time spent discussing above.     Arvilla Meres, MD  12:39 PM

## 2023-07-09 ENCOUNTER — Telehealth: Payer: Self-pay

## 2023-07-13 ENCOUNTER — Telehealth: Payer: Self-pay

## 2023-07-13 ENCOUNTER — Other Ambulatory Visit
Admission: RE | Admit: 2023-07-13 | Discharge: 2023-07-13 | Disposition: A | Discharge status: Home / Self Care | Payer: 59 | Attending: Internal Medicine | Admitting: Internal Medicine

## 2023-07-13 DIAGNOSIS — I5020 Unspecified systolic (congestive) heart failure: Secondary | ICD-10-CM

## 2023-07-13 DIAGNOSIS — I428 Other cardiomyopathies: Secondary | ICD-10-CM

## 2023-07-13 NOTE — Telephone Encounter (Signed)
TTR test unable to be sent at this time.  Will contact pt when TTR blood work ready to be completed.

## 2023-07-14 LAB — MISC LABCORP TEST (SEND OUT): Labcorp test code: 482353

## 2023-07-26 ENCOUNTER — Encounter (INDEPENDENT_AMBULATORY_CARE_PROVIDER_SITE_OTHER): Payer: 59 | Admitting: Cardiology

## 2023-07-26 DIAGNOSIS — G4733 Obstructive sleep apnea (adult) (pediatric): Secondary | ICD-10-CM

## 2023-07-28 ENCOUNTER — Other Ambulatory Visit: Payer: Self-pay | Admitting: Family

## 2023-07-28 DIAGNOSIS — E1165 Type 2 diabetes mellitus with hyperglycemia: Secondary | ICD-10-CM

## 2023-07-29 ENCOUNTER — Ambulatory Visit: Payer: 59 | Attending: Internal Medicine

## 2023-07-29 DIAGNOSIS — I5020 Unspecified systolic (congestive) heart failure: Secondary | ICD-10-CM

## 2023-07-29 NOTE — Procedures (Signed)
Patient Information Study Date: 07/26/2023 Patient Name: Alexander [name] Patient ID: [id number] Birth Date:  Age:  Gender: Male BMI: 35.3 (W=238 lb, H=5' 9'') Stopbang: 5 Referring Physician: Arvilla Meres, MD  TEST DESCRIPTION:  Home sleep apnea testing was completed using the WatchPat, a Type 1 device, utilizing peripheral arterial tonometry (PAT), chest movement, actigraphy, pulse oximetry, pulse rate, body position and snore.  AHI was calculated with apnea and hypopnea using valid sleep time as the denominator. RDI includes apneas, hypopneas, and RERAs.  The data acquired and the scoring of sleep and all associated events were performed in accordance with the recommended standards and specifications as outlined in the AASM Manual for the Scoring of Sleep and Associated Events 2.2.0 (2015).  FINDINGS:  1.  Moderate Obstructive Sleep Apnea with AHI 27.1/hr.   2.  No Central Sleep Apnea with pAHIc 3.5/hr.  3.  Oxygen desaturations as low as 79%.  4.  Moderate snoring was present. O2 sats were < 88% for 19.8 min.  5.  Total sleep time was 6 hrs and 55 min.  6.  36.4% of total sleep time was spent in REM sleep.   7.  Normal sleep onset latency at 16 min  8.  Shortened REM sleep onset latency at 37 min.   9.  Total awakenings were 8.  10. Arrhythmia detection: Possible brief atrial fibrillation lasting 9 seconds.  This is not diagnostic and would consider further testing if clinically indicated.  DIAGNOSIS:   Moderate Obstructive Sleep Apnea (G47.33) Nocturnal Hypoxemia Possible Atrial Fibrillation  RECOMMENDATIONS:   1.  Clinical correlation of these findings is necessary.  The decision to treat obstructive sleep apnea (OSA) is usually based on the presence of apnea symptoms or the presence of associated medical conditions such as Hypertension, Congestive Heart Failure, Atrial Fibrillation or Obesity.  The most common symptoms of OSA are snoring, gasping for breath while  sleeping, daytime sleepiness and fatigue.   2.  Initiating apnea therapy is recommended given the presence of symptoms and/or associated conditions. Recommend proceeding with one of the following:     a.  Auto-CPAP therapy with a pressure range of 5-20cm H2O.     b.  An oral appliance (OA) that can be obtained from certain dentists with expertise in sleep medicine.  These are primarily of use in non-obese patients with mild and moderate disease.     c.  An ENT consultation which may be useful to look for specific causes of obstruction and possible treatment options.     d.  If patient is intolerant to PAP therapy, consider referral to ENT for evaluation for hypoglossal nerve stimulator.   3.  Close follow-up is necessary to ensure success with CPAP or oral appliance therapy for maximum benefit.  4.  A follow-up oximetry study on CPAP is recommended to assess the adequacy of therapy and determine the need for supplemental oxygen or the potential need for Bi-level therapy.  An arterial blood gas to determine the adequacy of baseline ventilation and oxygenation should also be considered.  5.  Healthy sleep recommendations include:  adequate nightly sleep (normal 7-9 hrs/night), avoidance of caffeine after noon and alcohol near bedtime, and maintaining a sleep environment that is cool, dark and quiet.  6.  Weight loss for overweight patients is recommended.  Even modest amounts of weight loss can significantly improve the severity of sleep apnea.  7.  Snoring recommendations include:  weight loss where appropriate, side sleeping, and avoidance  of alcohol before bed.  8.  Operation of motor vehicle should not be performed when sleepy.  Signature: Armanda Magic, MD; [facility]; Diplomat, American Board of Sleep Medicine Electronically Signed: 07/29/2023 1:34:44 PM

## 2023-07-30 NOTE — Telephone Encounter (Signed)
Can we get patient scheduled per my last office note.

## 2023-08-03 ENCOUNTER — Telehealth: Payer: Self-pay

## 2023-08-03 DIAGNOSIS — I428 Other cardiomyopathies: Secondary | ICD-10-CM

## 2023-08-03 DIAGNOSIS — G4733 Obstructive sleep apnea (adult) (pediatric): Secondary | ICD-10-CM

## 2023-08-03 DIAGNOSIS — I5042 Chronic combined systolic (congestive) and diastolic (congestive) heart failure: Secondary | ICD-10-CM

## 2023-08-03 NOTE — Telephone Encounter (Signed)
Notified patient of sleep study results and recommendations. All questions (if any) were answered. Patient verbalized understanding. CPAP Titration ordered today 08/03/23.

## 2023-08-03 NOTE — Telephone Encounter (Signed)
Spoke with pt regarding TTR genetic testing. Pt aware, agreeable, and verbalized understanding on completing this lab test.  Pt stated will call next week to let us know when he is planning on coming. Pt stated planning on 2 weeks.

## 2023-08-03 NOTE — Telephone Encounter (Signed)
-----   Message from Armanda Magic sent at 07/29/2023  1:36 PM EDT ----- Please let patient know that they have sleep apnea.  Recommend therapeutic CPAP titration for treatment of patient's sleep disordered breathing.  If unable to perform an in lab titration then initiate ResMed auto CPAP from 4 to 15cm H2O with heated humidity and mask of choice and overnight pulse ox on CPAP.

## 2023-08-03 NOTE — Telephone Encounter (Signed)
Pt needs scheduled for DM recheck.

## 2023-08-03 NOTE — Telephone Encounter (Signed)
Scheduled pt for 8/15

## 2023-08-06 ENCOUNTER — Ambulatory Visit (INDEPENDENT_AMBULATORY_CARE_PROVIDER_SITE_OTHER): Payer: 59 | Admitting: Nurse Practitioner

## 2023-08-06 ENCOUNTER — Encounter: Payer: Self-pay | Admitting: Nurse Practitioner

## 2023-08-06 VITALS — BP 122/80 | HR 100 | Temp 97.6°F | Ht 69.0 in | Wt 235.6 lb

## 2023-08-06 DIAGNOSIS — I10 Essential (primary) hypertension: Secondary | ICD-10-CM | POA: Diagnosis not present

## 2023-08-06 DIAGNOSIS — E1165 Type 2 diabetes mellitus with hyperglycemia: Secondary | ICD-10-CM

## 2023-08-06 DIAGNOSIS — Z125 Encounter for screening for malignant neoplasm of prostate: Secondary | ICD-10-CM | POA: Diagnosis not present

## 2023-08-06 DIAGNOSIS — Z794 Long term (current) use of insulin: Secondary | ICD-10-CM

## 2023-08-06 MED ORDER — LANTUS SOLOSTAR 100 UNIT/ML ~~LOC~~ SOPN
PEN_INJECTOR | SUBCUTANEOUS | Status: DC
Start: 2023-08-06 — End: 2023-10-14

## 2023-08-06 NOTE — Assessment & Plan Note (Signed)
Patient's blood pressure under good control.  He is on Entresto along with carvedilol.  Continue medication as prescribed follow-up with cardiology as recommended

## 2023-08-06 NOTE — Patient Instructions (Signed)
Nice to see you today I decreased the insulin to 22 units a day Follow up with me in 4 months, sooner if you need me

## 2023-08-06 NOTE — Assessment & Plan Note (Signed)
Patient currently on metformin 1000 mg twice daily, glipizide 10 mg twice daily, semaglutide 2 mg weekly.  He is on Lantus 26 units daily patient had an appreciable drop did experience some hypoglycemia we will titrate insulin down from 26 units nightly to 22 units nightly.  If he experiences more hypoglycemia he will reach out.  Follow-up 4 months for A1c recheck

## 2023-08-06 NOTE — Progress Notes (Signed)
Established Patient Office Visit  Subjective   Patient ID: Alexander Lari., male    DOB: 25-Jul-1966  Age: 57 y.o. MRN: [id number]  Chief Complaint  Patient presents with   Diabetes    Pt states he is feeling great.       DM2: Patient currently managed and maintained on glipizide 10 mg twice daily Ozempic 2 mg weekly, Lantus 26 mg at bedtime and metformin  States that one episode of hypoglycemia of 46.  States that he is checking it once a week   States that he is eating decently  Walking every day states that he will do 15-47mins   HTN: states that he is not having dizziness or lightheadedness.     Review of Systems  Constitutional:  Negative for chills and fever.  Respiratory:  Negative for shortness of breath.   Cardiovascular:  Negative for chest pain.  Gastrointestinal:        BM daily   Neurological:  Negative for headaches.      Objective:     BP 122/80   Pulse 100   Temp 97.6 F (36.4 C) (Temporal)   Ht 5\' 9"  (1.753 m)   Wt 235 lb 9.6 oz (106.9 kg)   SpO2 93%   BMI 34.79 kg/m  BP Readings from Last 3 Encounters:  08/06/23 122/80  07/06/23 (!) 149/103  05/04/23 110/62   Wt Readings from Last 3 Encounters:  08/06/23 235 lb 9.6 oz (106.9 kg)  07/06/23 238 lb (108 kg)  05/04/23 236 lb (107 kg)      Physical Exam Vitals and nursing note reviewed.  Constitutional:      Appearance: Normal appearance.  Cardiovascular:     Rate and Rhythm: Normal rate and regular rhythm.     Pulses:          Dorsalis pedis pulses are 2+ on the right side and 2+ on the left side.       Posterior tibial pulses are 2+ on the right side and 2+ on the left side.     Heart sounds: Normal heart sounds.  Pulmonary:     Effort: Pulmonary effort is normal.     Breath sounds: Normal breath sounds.  Abdominal:     General: Bowel sounds are normal.  Musculoskeletal:     Right lower leg: Edema (trace) present.     Left lower leg: Edema (trace) present.  Feet:     Right  foot:     Skin integrity: Skin integrity normal.     Toenail Condition: Right toenails are abnormally thick.     Left foot:     Skin integrity: Skin integrity normal.     Toenail Condition: Left toenails are abnormally thick.  Neurological:     Mental Status: He is alert.      No results found for any visits on 08/06/23.    The ASCVD Risk score (Arnett DK, et al., 2019) failed to calculate for the following reasons:   The valid total cholesterol range is 130 to 320 mg/dL    Assessment & Plan:   Problem List Items Addressed This Visit       Cardiovascular and Mediastinum   Essential hypertension    Patient's blood pressure under good control.  He is on Entresto along with carvedilol.  Continue medication as prescribed follow-up with cardiology as recommended        Endocrine   Uncontrolled type 2 diabetes mellitus with hyperglycemia, with long-term current use of  insulin (HCC) - Primary (Chronic)    Patient currently on metformin 1000 mg twice daily, glipizide 10 mg twice daily, semaglutide 2 mg weekly.  He is on Lantus 26 units daily patient had an appreciable drop did experience some hypoglycemia we will titrate insulin down from 26 units nightly to 22 units nightly.  If he experiences more hypoglycemia he will reach out.  Follow-up 4 months for A1c recheck      Relevant Medications   insulin glargine (LANTUS SOLOSTAR) 100 UNIT/ML Solostar Pen   Other Relevant Orders   CBC   Comprehensive metabolic panel   Other Visit Diagnoses     Screening for prostate cancer       Relevant Orders   PSA       Return in about 4 months (around 12/06/2023) for DM recheck.    Audria Nine, NP

## 2023-08-07 LAB — PSA: PSA: 0.94 ng/mL (ref 0.10–4.00)

## 2023-08-07 LAB — CBC
HCT: 42.2 % (ref 39.0–52.0)
Hemoglobin: 14.1 g/dL (ref 13.0–17.0)
MCHC: 33.4 g/dL (ref 30.0–36.0)
MCV: 90.3 fl (ref 78.0–100.0)
Platelets: 234 10*3/uL (ref 150.0–400.0)
RBC: 4.68 Mil/uL (ref 4.22–5.81)
RDW: 13.8 % (ref 11.5–15.5)
WBC: 4.9 10*3/uL (ref 4.0–10.5)

## 2023-08-07 LAB — COMPREHENSIVE METABOLIC PANEL
ALT: 15 U/L (ref 0–53)
AST: 18 U/L (ref 0–37)
Albumin: 4.4 g/dL (ref 3.5–5.2)
Alkaline Phosphatase: 45 U/L (ref 39–117)
BUN: 17 mg/dL (ref 6–23)
CO2: 27 meq/L (ref 19–32)
Calcium: 9.4 mg/dL (ref 8.4–10.5)
Chloride: 100 meq/L (ref 96–112)
Creatinine, Ser: 1 mg/dL (ref 0.40–1.50)
GFR: 83.02 mL/min (ref >60.00)
Glucose, Bld: 75 mg/dL (ref 70–99)
Potassium: 4 meq/L (ref 3.5–5.1)
Sodium: 134 meq/L — ABNORMAL LOW (ref 135–145)
Total Bilirubin: 1.1 mg/dL (ref 0.2–1.2)
Total Protein: 7.2 g/dL (ref 6.0–8.3)

## 2023-08-21 NOTE — Telephone Encounter (Signed)
Called and spoke w/ pt about coming in to clinic to collect blood type test to take to the lab and have completed. Pt aware and agrees to come either today, 7/18 or Monday, 7/22 to pick up and have completed.

## 2023-08-25 ENCOUNTER — Telehealth: Payer: Self-pay

## 2023-08-25 NOTE — Telephone Encounter (Addendum)
Called pt to remind him of his genetic testing that still needs to be completed. Pt stated that he is out of town and will be back this weekend. Pt stated that will come Monday to have it completed.  08/31/23>update Pt stated is out of town. Will come Friday instead. 09/04/23

## 2023-08-27 ENCOUNTER — Other Ambulatory Visit: Payer: Self-pay

## 2023-08-27 DIAGNOSIS — I10 Essential (primary) hypertension: Secondary | ICD-10-CM

## 2023-08-27 MED ORDER — CARVEDILOL 25 MG PO TABS
25.0000 mg | ORAL_TABLET | Freq: Two times a day (BID) | ORAL | 0 refills | Status: DC
Start: 2023-08-27 — End: 2024-01-05

## 2023-08-27 MED ORDER — ISOSORBIDE MONONITRATE ER 30 MG PO TB24: 15.0000 mg | ORAL_TABLET | Freq: Every day | ORAL | 0 refills | Status: DC

## 2023-08-27 MED ORDER — SIMVASTATIN 40 MG PO TABS: 40.0000 mg | ORAL_TABLET | Freq: Every day | ORAL | 0 refills | Status: DC

## 2023-09-07 ENCOUNTER — Encounter: Payer: 59 | Admitting: Internal Medicine

## 2023-09-11 ENCOUNTER — Encounter: Payer: Self-pay | Admitting: Nurse Practitioner

## 2023-09-17 ENCOUNTER — Encounter: Payer: Self-pay | Admitting: Nurse Practitioner

## 2023-09-27 ENCOUNTER — Other Ambulatory Visit: Payer: Self-pay | Admitting: Nurse Practitioner

## 2023-09-27 DIAGNOSIS — E1165 Type 2 diabetes mellitus with hyperglycemia: Secondary | ICD-10-CM

## 2023-10-14 ENCOUNTER — Other Ambulatory Visit: Payer: Self-pay | Admitting: Nurse Practitioner

## 2023-10-14 ENCOUNTER — Other Ambulatory Visit: Payer: Self-pay | Admitting: Cardiology

## 2023-10-14 DIAGNOSIS — I10 Essential (primary) hypertension: Secondary | ICD-10-CM

## 2023-10-14 DIAGNOSIS — E1165 Type 2 diabetes mellitus with hyperglycemia: Secondary | ICD-10-CM

## 2023-10-14 DIAGNOSIS — I428 Other cardiomyopathies: Secondary | ICD-10-CM

## 2023-10-14 MED ORDER — SIMVASTATIN 40 MG PO TABS: 40.0000 mg | ORAL_TABLET | Freq: Every day | ORAL | 0 refills | Status: DC

## 2023-10-14 MED ORDER — ISOSORBIDE MONONITRATE ER 30 MG PO TB24: 15.0000 mg | ORAL_TABLET | Freq: Every day | ORAL | 0 refills | Status: DC

## 2023-10-16 MED ORDER — LANTUS SOLOSTAR 100 UNIT/ML ~~LOC~~ SOPN
PEN_INJECTOR | SUBCUTANEOUS | 0 refills | Status: DC
Start: 2023-10-16 — End: 2024-06-10

## 2023-11-02 ENCOUNTER — Telehealth: Payer: Self-pay | Admitting: Nurse Practitioner

## 2023-11-02 ENCOUNTER — Telehealth: Payer: Self-pay

## 2023-11-02 ENCOUNTER — Encounter: Payer: Self-pay | Admitting: Nurse Practitioner

## 2023-11-02 ENCOUNTER — Ambulatory Visit (INDEPENDENT_AMBULATORY_CARE_PROVIDER_SITE_OTHER): Payer: 59 | Admitting: Nurse Practitioner

## 2023-11-02 VITALS — BP 142/102 | HR 84 | Temp 97.6°F | Ht 69.0 in | Wt 238.2 lb

## 2023-11-02 DIAGNOSIS — L989 Disorder of the skin and subcutaneous tissue, unspecified: Secondary | ICD-10-CM | POA: Diagnosis not present

## 2023-11-02 NOTE — Patient Instructions (Addendum)
Nice to see you today Continue keeping the skin moisturized. Make sure you are drying your feet and toes well. Dab dry do not scrub dry Follow up as scheduled

## 2023-11-02 NOTE — Telephone Encounter (Signed)
Saw [place] in office and he was request a refill on his Entresto. Told him I would send you a message  Thanks Matt C

## 2023-11-02 NOTE — Assessment & Plan Note (Signed)
Did review keeping skin moisturized and always wearing a foot covering in the setting of diabetes.  Did review signs and symptoms when patient needs to be seen again.  Patient instructed to keep feet dry especially after being in shoes for long periods of time and bathing.  Instructed to dab not scrub dry as this will decrease skin integrity and increase the likelihood of opening up his skin

## 2023-11-02 NOTE — Telephone Encounter (Signed)
**Note De-Identified Debbera Wolken Obfuscation** CPAP Titration PA started through the [facility] Provider Portal: Status: PENDED Reason: 1.Disposition pending review Tracking #: [id number]

## 2023-11-02 NOTE — Progress Notes (Signed)
Acute Office Visit  Subjective:     Patient ID: Alexander Abadi., male    DOB: 1966-03-17, 57 y.o.   MRN: [id number]  Chief Complaint  Patient presents with   bottom of foot skin peeling    Pt complains of dry, peeling skin at bottom of both feet. Pt states of no bleeding but skin is very raw. Slight itchiness. Has tried vaseline but only helps a little.     HPI Patient is in today for skin issue with a history of DM2, heart failure, HTN, HLD.  States that it started approx 1-2 weeks ago. States that he has some dry skin or cut. States that it is both. Sates that he did go to a game and did not wear socks while in his shoes and walked a lot. States that he has been using Vaseline and at first that would skin but now it does not. He feels like it has been improving. He denies and drainage or discharge   Review of Systems  Constitutional:  Negative for chills and fever.  Respiratory:  Negative for shortness of breath.   Cardiovascular:  Negative for chest pain.  Skin:        "+" skin issue  Neurological:  Negative for headaches.        Objective:    BP (!) 142/102   Pulse 84   Temp 97.6 F (36.4 C) (Oral)   Ht 5\' 9"  (1.753 m)   Wt 238 lb 3.2 oz (108 kg)   SpO2 96%   BMI 35.18 kg/m  BP Readings from Last 3 Encounters:  11/02/23 (!) 142/102  08/06/23 122/80  07/06/23 (!) 149/103   Wt Readings from Last 3 Encounters:  11/02/23 238 lb 3.2 oz (108 kg)  08/06/23 235 lb 9.6 oz (106.9 kg)  07/06/23 238 lb (108 kg)   SpO2 Readings from Last 3 Encounters:  11/02/23 96%  08/06/23 93%  07/06/23 100%      Physical Exam Vitals and nursing note reviewed.  Constitutional:      Appearance: Normal appearance.  Cardiovascular:     Rate and Rhythm: Normal rate and regular rhythm.     Heart sounds: Normal heart sounds.  Pulmonary:     Effort: Pulmonary effort is normal.     Breath sounds: Normal breath sounds.  Musculoskeletal:       Feet:  Feet:     Comments: Open the  skin on the proximal plantar surface. Patient does have some moistened skin that has not opened in between fourth and fifth toes bilaterally. No erythema edema or discharge noted no tenderness to palpation Neurological:     Mental Status: He is alert.     No results found for any visits on 11/02/23.      Assessment & Plan:   Problem List Items Addressed This Visit       Musculoskeletal and Integument   Skin lesion of foot - Primary    Did review keeping skin moisturized and always wearing a foot covering in the setting of diabetes.  Did review signs and symptoms when patient needs to be seen again.  Patient instructed to keep feet dry especially after being in shoes for long periods of time and bathing.  Instructed to dab not scrub dry as this will decrease skin integrity and increase the likelihood of opening up his skin       No orders of the defined types were placed in this encounter.   Return if  symptoms worsen or fail to improve, for As scheduled .  Audria Nine, NP

## 2023-11-03 ENCOUNTER — Other Ambulatory Visit: Payer: Self-pay

## 2023-11-03 MED ORDER — ENTRESTO 97-103 MG PO TABS: 1.0000 | ORAL_TABLET | Freq: Two times a day (BID) | ORAL | 3 refills | Status: DC

## 2023-11-03 NOTE — Telephone Encounter (Signed)
Disp Refills Start End   sacubitril-valsartan (ENTRESTO) 97-103 MG 60 tablet 3 11/03/2023 --   Sig - Route: Take 1 tablet by mouth 2 (two) times daily. - Oral   Sent to pharmacy as: sacubitril-valsartan (ENTRESTO) 97-103 MG   E-Prescribing Status: Sent to pharmacy (11/03/2023  8:11 AM EST)    Pharmacy  [id number] - [place], [place] - [address]

## 2023-11-09 ENCOUNTER — Encounter: Payer: Self-pay | Admitting: Cardiology

## 2023-11-09 ENCOUNTER — Ambulatory Visit: Payer: 59 | Attending: Cardiology | Admitting: Cardiology

## 2023-11-09 VITALS — BP 106/80 | HR 94 | Ht 69.0 in | Wt 238.4 lb

## 2023-11-09 DIAGNOSIS — I428 Other cardiomyopathies: Secondary | ICD-10-CM | POA: Diagnosis not present

## 2023-11-09 DIAGNOSIS — I10 Essential (primary) hypertension: Secondary | ICD-10-CM

## 2023-11-09 DIAGNOSIS — E7800 Pure hypercholesterolemia, unspecified: Secondary | ICD-10-CM | POA: Diagnosis not present
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
## 2023-11-09 NOTE — Patient Instructions (Signed)
Medication Instructions:   Your physician recommends that you continue on your current medications as directed. Please refer to the Current Medication list given to you today.  *If you need a refill on your cardiac medications before your next appointment, please call your pharmacy*   Lab Work:  None Ordered  If you have labs (blood work) drawn today and your tests are completely normal, you will receive your results only by: MyChart Message (if you have MyChart) OR A paper copy in the mail If you have any lab test that is abnormal or we need to change your treatment, we will call you to review the results.   Testing/Procedures:  Your physician has requested that you have an echocardiogram in one year. Echocardiography is a painless test that uses sound waves to create images of your heart. It provides your doctor with information about the size and shape of your heart and how well your heart's chambers and valves are working. This procedure takes approximately one hour. There are no restrictions for this procedure. Please do NOT wear cologne, perfume, aftershave, or lotions (deodorant is allowed). Please arrive 15 minutes prior to your appointment time.  Please note: We ask at that you not bring children with you during ultrasound (echo/ vascular) testing. Due to room size and safety concerns, children are not allowed in the ultrasound rooms during exams. Our front office staff cannot provide observation of children in our lobby area while testing is being conducted. An adult accompanying a patient to their appointment will only be allowed in the ultrasound room at the discretion of the ultrasound technician under special circumstances. We apologize for any inconvenience.    Follow-Up: At [facility], you and your health needs are our priority.  As part of our continuing mission to provide you with exceptional heart care, we have created designated Provider Care Teams.  These  Care Teams include your primary Cardiologist (physician) and Advanced Practice Providers (APPs -  Physician Assistants and Nurse Practitioners) who all work together to provide you with the care you need, when you need it.  We recommend signing up for the patient portal called "MyChart".  Sign up information is provided on this After Visit Summary.  MyChart is used to connect with patients for Virtual Visits (Telemedicine).  Patients are able to view lab/test results, encounter notes, upcoming appointments, etc.  Non-urgent messages can be sent to your provider as well.   To learn more about what you can do with MyChart, go to [email or web address].    Your next appointment:    After Echocardiogram  Provider:   You may see Debbe Odea, MD or one of the following Advanced Practice Providers on your designated Care Team:   Nicolasa Ducking, NP Eula Listen, PA-C Cadence Fransico Michael, PA-C Charlsie Quest, NP Carlos Levering, NP

## 2023-11-17 ENCOUNTER — Encounter: Payer: Self-pay | Admitting: Nurse Practitioner

## 2023-11-18 NOTE — Telephone Encounter (Signed)
Can we see what information the insurance company is requesting

## 2023-11-24 ENCOUNTER — Encounter: Payer: Self-pay | Admitting: Internal Medicine

## 2023-11-25 ENCOUNTER — Other Ambulatory Visit: Payer: Self-pay

## 2023-11-25 MED ORDER — SIMVASTATIN 40 MG PO TABS: 40.0000 mg | ORAL_TABLET | Freq: Every day | ORAL | 3 refills | Status: DC

## 2023-12-03 ENCOUNTER — Other Ambulatory Visit: Payer: Self-pay

## 2023-12-03 MED ORDER — ISOSORBIDE MONONITRATE ER 30 MG PO TB24: 15.0000 mg | ORAL_TABLET | Freq: Every day | ORAL | 2 refills | Status: DC

## 2023-12-03 NOTE — Telephone Encounter (Signed)
Requested Prescriptions   Signed Prescriptions Disp Refills   isosorbide mononitrate (IMDUR) 30 MG 24 hr tablet 45 tablet 2    Sig: Take 0.5 tablets (15 mg total) by mouth daily.    Authorizing Provider: Debbe Odea    Ordering User: Guerry Minors   Last visit: 11/09/23 with plan to f/u 12 months  next visit: none/active recall

## 2023-12-07 ENCOUNTER — Ambulatory Visit: Payer: 59 | Admitting: Nurse Practitioner

## 2023-12-07 ENCOUNTER — Encounter: Payer: Self-pay | Admitting: Nurse Practitioner

## 2023-12-07 VITALS — BP 120/84 | HR 89 | Temp 97.6°F | Ht 69.0 in | Wt 239.0 lb

## 2023-12-07 DIAGNOSIS — E119 Type 2 diabetes mellitus without complications: Secondary | ICD-10-CM | POA: Diagnosis not present

## 2023-12-07 DIAGNOSIS — H938X1 Other specified disorders of right ear: Secondary | ICD-10-CM | POA: Diagnosis not present

## 2023-12-07 DIAGNOSIS — Z23 Encounter for immunization: Secondary | ICD-10-CM | POA: Diagnosis not present

## 2023-12-07 DIAGNOSIS — Z794 Long term (current) use of insulin: Secondary | ICD-10-CM | POA: Diagnosis not present

## 2023-12-07 DIAGNOSIS — M79602 Pain in left arm: Secondary | ICD-10-CM | POA: Diagnosis not present

## 2023-12-07 LAB — MICROALBUMIN / CREATININE URINE RATIO
Creatinine,U: 405.6 mg/dL
Microalb Creat Ratio: 15.2 mg/g (ref 0.0–30.0)
Microalb, Ur: 61.7 mg/dL — ABNORMAL HIGH (ref 0.0–1.9)

## 2023-12-07 LAB — POCT GLYCOSYLATED HEMOGLOBIN (HGB A1C): Hemoglobin A1C: 6.6 % — AB (ref 4.0–5.6)

## 2023-12-07 MED ORDER — METHOCARBAMOL 500 MG PO TABS
500.0000 mg | ORAL_TABLET | Freq: Three times a day (TID) | ORAL | 0 refills | Status: DC | PRN
Start: 2023-12-07 — End: 2024-02-17

## 2023-12-07 NOTE — Patient Instructions (Signed)
Nice to see you today A1C was 6.6% today. Keep up the good work Follow up with me in 6 months, sooner if you need me. We will do labs at that office visit

## 2023-12-07 NOTE — Assessment & Plan Note (Signed)
Verbal consent obtained.  Patient was prepped per office policy.  A mixture of water hydroperoxide was used and ear was irrigated.  Patient tolerated procedure well.  Impaction removed

## 2023-12-07 NOTE — Progress Notes (Signed)
Established Patient Office Visit  Subjective   Patient ID: Alexander Petitte., male    DOB: 24-Nov-1966  Age: 57 y.o. MRN: [id number]  Chief Complaint  Patient presents with   Diabetes    Had diabetic eye exam done in march 2024      DM2: patient patient is currently on metformin 1000 mg twice daily glipizide 10 mg twice daily, semaglutide 2 mg weekly.  Patient is also on Lantus 22 units nightly. States that he is trying to check it 1-2 times week. States that he is getting no low glucise and hightest in the 150s.   OSA: states that he did an at home and was dx with OSA and was told he wanted to do it in person sleep study.  This is presumably for CPAP titration.  This has been ordered by cardiology.  I do see where the test was read and patient does have moderate sleep apnea.  States insurance requires a prior authorization for titration study.  Defer to ordering clinician/specialty.  Left arm pain: states that it started approx 1 week ago and was pulling frienght. State that it is intermittent. States that the needle and will have some thumb numbness. States that if he hangs lose it is worse.  States that he has tried advil and napyersn that does help  Right ear: does not hear as well. States that he does wear a headset while driving. Tried ear drops with out relief     Review of Systems  Constitutional:  Negative for chills and fever.  Respiratory:  Negative for shortness of breath.   Cardiovascular:  Negative for chest pain.  Gastrointestinal:  Negative for abdominal pain and diarrhea.       BM daily   Musculoskeletal:  Positive for joint pain.  Neurological:  Positive for tingling. Negative for weakness and headaches.  Psychiatric/Behavioral:  Negative for hallucinations and suicidal ideas.       Objective:     BP 120/84   Pulse 89   Temp 97.6 F (36.4 C) (Oral)   Ht 5\' 9"  (1.753 m)   Wt 239 lb (108.4 kg)   SpO2 97%   BMI 35.29 kg/m  BP Readings from Last 3  Encounters:  12/07/23 120/84  11/09/23 106/80  11/02/23 (!) 142/102   Wt Readings from Last 3 Encounters:  12/07/23 239 lb (108.4 kg)  11/09/23 238 lb 6.4 oz (108.1 kg)  11/02/23 238 lb 3.2 oz (108 kg)   SpO2 Readings from Last 3 Encounters:  12/07/23 97%  11/09/23 95%  11/02/23 96%      Physical Exam Vitals and nursing note reviewed.  Constitutional:      Appearance: Normal appearance.  Cardiovascular:     Rate and Rhythm: Normal rate and regular rhythm.     Pulses:          Radial pulses are 2+ on the right side and 2+ on the left side.     Heart sounds: Normal heart sounds.  Pulmonary:     Effort: Pulmonary effort is normal.     Breath sounds: Normal breath sounds.  Musculoskeletal:       Arms:  Neurological:     Mental Status: He is alert.     Deep Tendon Reflexes:     Reflex Scores:      Bicep reflexes are 2+ on the right side and 2+ on the left side.    Comments: Bilateral upper extremity strength 5/5.  Results for orders placed or performed in visit on 12/07/23  POCT glycosylated hemoglobin (Hb A1C)  Result Value Ref Range   Hemoglobin A1C 6.6 (A) 4.0 - 5.6 %   HbA1c POC (<> result, manual entry)     HbA1c, POC (prediabetic range)     HbA1c, POC (controlled diabetic range)        The ASCVD Risk score (Arnett DK, et al., 2019) failed to calculate for the following reasons:   The valid total cholesterol range is 130 to 320 mg/dL    Assessment & Plan:   Problem List Items Addressed This Visit       Endocrine   Controlled type 2 diabetes mellitus without complication, with long-term current use of insulin (HCC) - Primary   Patient currently maintained on metformin 1000 mg twice daily, glipizide 10 mg twice daily, semaglutide 2 mg once weekly, Lantus 22 units nightly.  Patient's A1c well-controlled at 6.6%.  Patient denies any episodes of hypoglycemia continue diabetic regimen as is      Relevant Orders   POCT glycosylated hemoglobin (Hb A1C)  (Completed)   Microalbumin / creatinine urine ratio     Nervous and Auditory   Ear fullness, right   Verbal consent obtained.  Patient was prepped per office policy.  A mixture of water hydroperoxide was used and ear was irrigated.  Patient tolerated procedure well.  Impaction removed      Relevant Orders   Ear Lavage     Other   Left arm pain   Left-sided radicular nature.  Will start with methocarbamol 500 mg 3 times daily as needed sedation precautions reviewed.  If no relief we will send in steroid taper patient was given precautions in regards to hyperglycemic effect of steroids if needed.      Relevant Medications   methocarbamol (ROBAXIN) 500 MG tablet   Other Visit Diagnoses       Need for shingles vaccine       Relevant Orders   Zoster Recombinant (Shingrix )       Return in about 6 months (around 06/06/2024) for CPE and Labs.    Audria Nine, NP

## 2023-12-07 NOTE — Assessment & Plan Note (Signed)
Left-sided radicular nature.  Will start with methocarbamol 500 mg 3 times daily as needed sedation precautions reviewed.  If no relief we will send in steroid taper patient was given precautions in regards to hyperglycemic effect of steroids if needed.

## 2023-12-07 NOTE — Assessment & Plan Note (Signed)
Patient currently maintained on metformin 1000 mg twice daily, glipizide 10 mg twice daily, semaglutide 2 mg once weekly, Lantus 22 units nightly.  Patient's A1c well-controlled at 6.6%.  Patient denies any episodes of hypoglycemia continue diabetic regimen as is

## 2024-01-05 ENCOUNTER — Other Ambulatory Visit: Payer: Self-pay | Admitting: Nurse Practitioner

## 2024-01-05 ENCOUNTER — Other Ambulatory Visit: Payer: Self-pay

## 2024-01-05 DIAGNOSIS — E1165 Type 2 diabetes mellitus with hyperglycemia: Secondary | ICD-10-CM

## 2024-01-05 DIAGNOSIS — I10 Essential (primary) hypertension: Secondary | ICD-10-CM

## 2024-01-05 MED ORDER — CARVEDILOL 25 MG PO TABS
25.0000 mg | ORAL_TABLET | Freq: Two times a day (BID) | ORAL | 0 refills | Status: DC
Start: 2024-01-05 — End: 2024-03-08

## 2024-01-05 NOTE — Telephone Encounter (Signed)
 Left voicemail for return call

## 2024-01-05 NOTE — Telephone Encounter (Signed)
 Requested Prescriptions   Signed Prescriptions Disp Refills   carvedilol  (COREG ) 25 MG tablet 60 tablet 0    Sig: Take 1 tablet (25 mg total) by mouth 2 (two) times daily. Overdue yearly follow up.  PLEASE CALL OFFICE TO SCHEDULE APPOINTMENT PRIOR TO NEXT REFILL (first attempt)    Authorizing Provider: DARLISS ROGUE    Ordering User: CLAUDENE POWELL CROME

## 2024-01-05 NOTE — Telephone Encounter (Signed)
 Last office visit: 11/09/23 with plan to f/u 12 months. next office visit: none/active recall  Please schedule f/u appt.  Thanks!  Needs f/u prior to 90 days supply rx.

## 2024-01-07 NOTE — Telephone Encounter (Signed)
 Appointment scheduled for 02/15/24.

## 2024-01-26 ENCOUNTER — Telehealth: Payer: 59

## 2024-01-26 NOTE — Telephone Encounter (Signed)
**Note De-Identified Johnthan Axtman Obfuscation** I started a CPAP Titration PA through the [facility] Provider Portal. Tracking #: [id number]

## 2024-02-09 NOTE — Telephone Encounter (Signed)
**Note De-Identified Josanna Hefel Obfuscation** I called [employer] and s/w Missy who advised me that [facility] denied the pts CPAP Titration due to lack of medical necessity. Ria did verify that they received all of the clinical notes that I attached to the PA.  Per [place], [facility] will cover a at home CPAP Titration otherwise, the only step now as far as appealing their decision is a peer to peer with Dr Mayford Knife.  Forwarding this note to Dr Mayford Knife and her nurse for advisement to the pt.

## 2024-02-11 NOTE — Telephone Encounter (Signed)
 Resubmitted PA and supporting clinical documents via [employer] portal; currently pending with tracking # [id number].

## 2024-02-15 ENCOUNTER — Ambulatory Visit: Payer: 59 | Admitting: Cardiology

## 2024-02-16 ENCOUNTER — Other Ambulatory Visit: Payer: Self-pay | Admitting: Nurse Practitioner

## 2024-02-16 DIAGNOSIS — M79602 Pain in left arm: Secondary | ICD-10-CM

## 2024-02-22 NOTE — Telephone Encounter (Signed)
 Spoke with Helmut Muster at [facility] (call reference [phone]). She confirmed that CPAP titration (CPT [id number]) is authorized through 05/21/24.    Spoke with pt to advise that CPAP titration has been authorized by insurance. Pt declines to take Sleep Lab number at this time - states he will wait for them to contact him. Pt denies questions at this time.

## 2024-02-29 ENCOUNTER — Ambulatory Visit: Payer: 59 | Attending: Cardiology

## 2024-02-29 DIAGNOSIS — I428 Other cardiomyopathies: Secondary | ICD-10-CM | POA: Diagnosis not present

## 2024-02-29 LAB — ECHOCARDIOGRAM COMPLETE
AV Mean grad: 4 mmHg
AV Peak grad: 7.8 mmHg
Ao pk vel: 1.4 m/s
Area-P 1/2: 4.39 cm2
S' Lateral: 3.5 cm

## 2024-03-02 ENCOUNTER — Other Ambulatory Visit: Payer: Self-pay

## 2024-03-02 MED ORDER — ENTRESTO 97-103 MG PO TABS: 1.0000 | ORAL_TABLET | Freq: Two times a day (BID) | ORAL | 0 refills | Status: DC

## 2024-03-08 ENCOUNTER — Other Ambulatory Visit: Payer: Self-pay | Admitting: Emergency Medicine

## 2024-03-08 DIAGNOSIS — I10 Essential (primary) hypertension: Secondary | ICD-10-CM

## 2024-03-08 MED ORDER — CARVEDILOL 25 MG PO TABS
25.0000 mg | ORAL_TABLET | Freq: Two times a day (BID) | ORAL | 0 refills | Status: DC
Start: 2024-03-08 — End: 2024-06-10

## 2024-03-11 MED ORDER — DAPAGLIFLOZIN PROPANEDIOL 10 MG PO TABS: 10.0000 mg | ORAL_TABLET | Freq: Every day | ORAL | 3 refills | Status: DC

## 2024-03-14 ENCOUNTER — Telehealth: Payer: Self-pay | Admitting: Pharmacy Technician

## 2024-03-14 ENCOUNTER — Other Ambulatory Visit (HOSPITAL_COMMUNITY): Payer: Self-pay

## 2024-03-14 NOTE — Telephone Encounter (Signed)
 Pharmacy Patient Advocate Encounter   Received notification from Fax that prior authorization for [facility] is required/requested.   Insurance verification completed.   The patient is insured through [facility] .   Per test claim: PA required; PA submitted to above mentioned insurance via CoverMyMeds Key/confirmation #/EOC [id number] Status is pending   It did ask: Does the patient have a history of a 30 day trial resulting in a therapeutic failure, or a contraindication, or intolerance to Jardiance?  Which the patient has not had jardiance

## 2024-03-15 NOTE — Telephone Encounter (Signed)
 Pharmacy Patient Advocate Encounter  Received notification from [facility] that Prior Authorization for farxgia has been DENIED.  See denial reason below. No denial letter attached in CMM. Will attach denial letter to Media tab once received.  The insurance wants the patient to try and fail 30 days of jardiance first  PA #/Case ID/Reference #:  [id number]

## 2024-03-16 ENCOUNTER — Other Ambulatory Visit (HOSPITAL_COMMUNITY): Payer: Self-pay

## 2024-03-16 MED ORDER — EMPAGLIFLOZIN 10 MG PO TABS
10.0000 mg | ORAL_TABLET | Freq: Every day | ORAL | 1 refills | Status: DC
  Filled 2024-03-16: qty 30, 30d supply, fill #0

## 2024-03-16 NOTE — Telephone Encounter (Signed)
 Okay per MD to switch to Jardiance 10 mg daily.  Sent over in place of [place].   Thanks!

## 2024-03-16 NOTE — Addendum Note (Signed)
 Addended by: Cydney Ok on: 03/16/2024 04:30 PM   Modules accepted: Orders

## 2024-03-21 ENCOUNTER — Other Ambulatory Visit (HOSPITAL_COMMUNITY): Payer: Self-pay

## 2024-03-21 ENCOUNTER — Encounter: Payer: Self-pay | Admitting: Cardiology

## 2024-03-21 MED ORDER — EMPAGLIFLOZIN 10 MG PO TABS: 10.0000 mg | ORAL_TABLET | Freq: Every day | ORAL | 1 refills | Status: DC

## 2024-03-21 NOTE — Telephone Encounter (Signed)
 CPAP titration has been scheduled for 06/12/24. Reached out to [facility] to have PA window extended. Successfully extended to 06/20/24.

## 2024-03-22 ENCOUNTER — Other Ambulatory Visit (HOSPITAL_COMMUNITY): Payer: Self-pay

## 2024-03-28 ENCOUNTER — Encounter: Payer: Self-pay | Admitting: Cardiology

## 2024-03-28 ENCOUNTER — Ambulatory Visit: Payer: 59 | Attending: Cardiology | Admitting: Cardiology

## 2024-03-28 VITALS — BP 124/78 | HR 97 | Ht 69.0 in | Wt 238.6 lb

## 2024-03-28 DIAGNOSIS — I10 Essential (primary) hypertension: Secondary | ICD-10-CM | POA: Diagnosis not present

## 2024-03-28 DIAGNOSIS — E7800 Pure hypercholesterolemia, unspecified: Secondary | ICD-10-CM | POA: Diagnosis not present

## 2024-03-28 DIAGNOSIS — I428 Other cardiomyopathies: Secondary | ICD-10-CM | POA: Diagnosis not present

## 2024-03-28 NOTE — Patient Instructions (Signed)
 Medication Instructions:   Your Physician recommend you continue on your current medication as directed.     *If you need a refill on your cardiac medications before your next appointment, please call your pharmacy*  Lab Work:  No labs ordered today.   Testing/Procedures:  No test ordered today.  Follow-Up: At [facility], you and your health needs are our priority.  As part of our continuing mission to provide you with exceptional heart care, our providers are all part of one team.  This team includes your primary Cardiologist (physician) and Advanced Practice Providers or APPs (Physician Assistants and Nurse Practitioners) who all work together to provide you with the care you need, when you need it.  Your next appointment:   1 year(s)  Provider:   You may see Debbe Odea, MD or one of the following Advanced Practice Providers on your designated Care Team:   Nicolasa Ducking, NP Ames Dura, PA-C Eula Listen, PA-C Cadence [place], PA-C Charlsie Quest, NP Carlos Levering, NP    We recommend signing up for the patient portal called "MyChart".  Sign up information is provided on this After Visit Summary.  MyChart is used to connect with patients for Virtual Visits (Telemedicine).  Patients are able to view lab/test results, encounter notes, upcoming appointments, etc.  Non-urgent messages can be sent to your provider as well.   To learn more about what you can do with MyChart, go to [email or web address].
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
## 2024-04-11 LAB — HM DIABETES EYE EXAM

## 2024-04-18 ENCOUNTER — Other Ambulatory Visit: Payer: Self-pay | Admitting: Cardiology

## 2024-04-18 MED ORDER — ENTRESTO 97-103 MG PO TABS: 1.0000 | ORAL_TABLET | Freq: Two times a day (BID) | ORAL | 3 refills | Status: DC

## 2024-05-03 ENCOUNTER — Encounter: Payer: Self-pay | Admitting: Nurse Practitioner

## 2024-05-31 ENCOUNTER — Other Ambulatory Visit: Payer: Self-pay | Admitting: Nurse Practitioner

## 2024-05-31 DIAGNOSIS — E1165 Type 2 diabetes mellitus with hyperglycemia: Secondary | ICD-10-CM

## 2024-06-01 ENCOUNTER — Telehealth: Payer: Self-pay | Admitting: Nurse Practitioner

## 2024-06-01 NOTE — Telephone Encounter (Signed)
 Patient is over due for CPE. Schedule in next 30 days to continue getting refills

## 2024-06-02 NOTE — Telephone Encounter (Signed)
 Called  pt and schedule a appt for cpe

## 2024-06-09 ENCOUNTER — Other Ambulatory Visit: Payer: Self-pay | Admitting: Cardiology

## 2024-06-09 ENCOUNTER — Other Ambulatory Visit: Payer: Self-pay | Admitting: Nurse Practitioner

## 2024-06-09 DIAGNOSIS — E1165 Type 2 diabetes mellitus with hyperglycemia: Secondary | ICD-10-CM

## 2024-06-09 DIAGNOSIS — I10 Essential (primary) hypertension: Secondary | ICD-10-CM

## 2024-06-10 NOTE — Telephone Encounter (Signed)
 Called  pt and schedule a appt

## 2024-06-10 NOTE — Telephone Encounter (Signed)
 Patient needs a DM2 follow up with me in the next 30 days to continue getting refills

## 2024-06-12 ENCOUNTER — Ambulatory Visit (HOSPITAL_BASED_OUTPATIENT_CLINIC_OR_DEPARTMENT_OTHER): Attending: Cardiology | Admitting: Cardiology

## 2024-06-12 VITALS — Ht 69.0 in | Wt 238.0 lb

## 2024-06-12 DIAGNOSIS — G4733 Obstructive sleep apnea (adult) (pediatric): Secondary | ICD-10-CM | POA: Insufficient documentation

## 2024-06-13 NOTE — Procedures (Signed)
   [facility] [address] [place], [place] [address] Tel: [phone]   Fax: [phone]  Titration Interpretation  Patient Name:  Alexander [name], Alexander [name] Date:  06/12/2024 Referring Physician:  Wilbert Bihari MD  Indications for Polysomnography The patient is a 58 year old Male who is 5' 9 and weighs 238.0 lbs. His BMI equals 35.3.  A full night titration treatment study was performed.  Medication  No Data.   Polysomnogram Data A full night polysomnogram recorded the standard physiologic parameters including EEG, EOG, EMG, EKG, nasal and oral airflow.  Respiratory parameters of chest and abdominal movements were recorded with Respiratory Inductance Plethysmography belts.  Oxygen saturation was recorded by pulse oximetry.   Sleep Architecture The total recording time of the polysomnogram was 404.3 minutes.  The total sleep time was 335.0 minutes.  The patient spent 1.8% of total sleep time in Stage N1, 80.3% in Stage N2, 0.1% in Stages N3, and 17.8% in REM.  Sleep latency was 15.3 minutes.  REM latency was 68.0 minutes.  Sleep Efficiency was 82.9%.  Wake after Sleep Onset time was 54.0 minutes.  Titration Summary The patient was titrated at pressures ranging from 5 cm/H20 up to 15 cm/H2O The last pressure used in the study was 15 cm/H20.  Respiratory Events The polysomnogram revealed a presence of 0 obstructive, 3 centrals, and 0 mixed apneas resulting in an Apnea index of 0.5 events per hour.  There were 48 hypopneas (>=3% desaturation and/or arousal) resulting in an Apnea\Hypopnea Index (AHI >=3% desaturation and/or arousal) of 9.1 events per hour.  There were 23 hypopneas (>=4% desaturation) resulting in an Apnea\Hypopnea Index (AHI >=4% desaturation) of 4.7 events per hour.  There were 0 Respiratory Effort Related Arousals resulting in a RERA index of 0 events per hour. The Respiratory Disturbance Index is 9.1 events per hour.  The snore index was 0  events per hour.  Mean oxygen saturation was 95.3%.  The lowest oxygen saturation during sleep was 90.0%.  Time spent <=88% oxygen saturation was 0.2 minutes.  Limb Activity There were 0 limb movements recorded  Cardiac Summary The average pulse rate was 85.3 bpm.  The minimum pulse rate was 44.0 bpm while the maximum pulse rate was 101.0 bpm.  Cardiac rhythm was normal.  Diagnosis:  Obstructive Sleep Apnea Successful CPAP titration  Recommendations: Recommend a trial of ResMed CPAP at 15cm H2O with heated humidity and large ResMed Airfit F20 mask. The patient should be encouraged to practice good sleep hygiene and avoid sleeping in the supine position. Encourage the patient to avoid driving when sleepy. Followup in Sleep clinic in 6 weeks.   This study was personally reviewed and electronically signed by: Wilbert Bihari MD Accredited Board Certified in Sleep Medicine Date/Time: 06/13/2024 9:30PM

## 2024-06-17 ENCOUNTER — Telehealth: Payer: Self-pay

## 2024-06-17 NOTE — Telephone Encounter (Signed)
-----   Message from Wilbert Bihari sent at 06/13/2024  9:31 PM EDT ----- Please let patient know that they had a successful PAP titration and let DME know that orders are in EPIC.  Please set up 6 week OV with me.

## 2024-06-17 NOTE — Telephone Encounter (Signed)
 Notified patient of successful titration. All questions were answered and patient verbalized understanding. New CPAP device and supply order sent to [facility] today.

## 2024-07-11 ENCOUNTER — Ambulatory Visit (INDEPENDENT_AMBULATORY_CARE_PROVIDER_SITE_OTHER): Admitting: Nurse Practitioner

## 2024-07-11 VITALS — BP 118/80 | HR 88 | Temp 97.8°F | Ht 69.0 in | Wt 225.8 lb

## 2024-07-11 DIAGNOSIS — Z125 Encounter for screening for malignant neoplasm of prostate: Secondary | ICD-10-CM | POA: Diagnosis not present

## 2024-07-11 DIAGNOSIS — I5042 Chronic combined systolic (congestive) and diastolic (congestive) heart failure: Secondary | ICD-10-CM

## 2024-07-11 DIAGNOSIS — Z0000 Encounter for general adult medical examination without abnormal findings: Secondary | ICD-10-CM | POA: Diagnosis not present

## 2024-07-11 DIAGNOSIS — E119 Type 2 diabetes mellitus without complications: Secondary | ICD-10-CM | POA: Diagnosis not present

## 2024-07-11 DIAGNOSIS — E785 Hyperlipidemia, unspecified: Secondary | ICD-10-CM

## 2024-07-11 DIAGNOSIS — I10 Essential (primary) hypertension: Secondary | ICD-10-CM

## 2024-07-11 DIAGNOSIS — Z794 Long term (current) use of insulin: Secondary | ICD-10-CM

## 2024-07-11 DIAGNOSIS — Z1211 Encounter for screening for malignant neoplasm of colon: Secondary | ICD-10-CM

## 2024-07-11 LAB — CBC
HCT: 41.9 % (ref 39.0–52.0)
Hemoglobin: 14 g/dL (ref 13.0–17.0)
MCHC: 33.4 g/dL (ref 30.0–36.0)
MCV: 88.3 fl (ref 78.0–100.0)
Platelets: 221 K/uL (ref 150.0–400.0)
RBC: 4.75 Mil/uL (ref 4.22–5.81)
RDW: 14 % (ref 11.5–15.5)
WBC: 4.7 K/uL (ref 4.0–10.5)

## 2024-07-11 LAB — LIPID PANEL
Cholesterol: 152 mg/dL (ref 0–200)
HDL: 48.6 mg/dL (ref >39.00)
LDL Cholesterol: 65 mg/dL (ref 0–99)
NonHDL: 103.85
Total CHOL/HDL Ratio: 3
Triglycerides: 192 mg/dL — ABNORMAL HIGH (ref 0.0–149.0)
VLDL: 38.4 mg/dL (ref 0.0–40.0)

## 2024-07-11 LAB — COMPREHENSIVE METABOLIC PANEL WITH GFR
ALT: 28 U/L (ref 0–53)
AST: 20 U/L (ref 0–37)
Albumin: 4.4 g/dL (ref 3.5–5.2)
Alkaline Phosphatase: 55 U/L (ref 39–117)
BUN: 18 mg/dL (ref 6–23)
CO2: 26 meq/L (ref 19–32)
Calcium: 9.2 mg/dL (ref 8.4–10.5)
Chloride: 104 meq/L (ref 96–112)
Creatinine, Ser: 0.86 mg/dL (ref 0.40–1.50)
GFR: 94.89 mL/min (ref >60.00)
Glucose, Bld: 151 mg/dL — ABNORMAL HIGH (ref 70–99)
Potassium: 4 meq/L (ref 3.5–5.1)
Sodium: 140 meq/L (ref 135–145)
Total Bilirubin: 1.1 mg/dL (ref 0.2–1.2)
Total Protein: 7.1 g/dL (ref 6.0–8.3)

## 2024-07-11 LAB — POCT GLYCOSYLATED HEMOGLOBIN (HGB A1C): Hemoglobin A1C: 7.1 % — AB (ref 4.0–5.6)

## 2024-07-11 LAB — MICROALBUMIN / CREATININE URINE RATIO
Creatinine,U: 98.9 mg/dL
Microalb Creat Ratio: 44.2 mg/g — ABNORMAL HIGH (ref 0.0–30.0)
Microalb, Ur: 4.4 mg/dL — ABNORMAL HIGH (ref 0.0–1.9)

## 2024-07-11 LAB — PSA: PSA: 0.73 ng/mL (ref 0.10–4.00)

## 2024-07-11 NOTE — Assessment & Plan Note (Signed)
 History of the same.  Patient maintained on simvastatin  40 mg daily.  Pending lipid panel

## 2024-07-11 NOTE — Assessment & Plan Note (Signed)
 Patient currently maintained on carvedilol  25 mg twice daily, furosemide  20 mg daily, isosorbide  15 mg daily, spironolactone  25 mg daily.  Continue taking medication as prescribed follow-up with cardiology as recommended

## 2024-07-11 NOTE — Assessment & Plan Note (Signed)
 Discussed age-appropriate immunizations and screening exams.  Did review patient's personal, surgical, social, family histories.  Patient is up-to-date on all age-appropriate vaccinations he would like.  Declined Prevnar 20 today.  Ambulatory referral to GI for CRC screening.  PSA for prostate cancer screening today.  Patient was given information at discharge about preventative healthcare maintenance with anticipatory guidance.

## 2024-07-11 NOTE — Assessment & Plan Note (Addendum)
 Patient currently only on Lantus  22 units daily, glipizide  10 mg twice daily, Jardiance  10 mg daily, and Ozempic  2 mg weekly.  Patient's A1c trended up slightly to 7.1%.  We will not change medications he will work on lifestyle modifications inclusive of limiting plain/processed sugars.

## 2024-07-11 NOTE — Progress Notes (Signed)
 Established Patient Office Visit  Subjective   Patient ID: Alexander Steers., male    DOB: Aug 06, 1966  Age: 58 y.o. MRN: [id number]  Chief Complaint  Patient presents with   Diabetes    HPI  CHF/hypertension: Patient currently maintained on carvedilol  25 mg twice daily, Jardiance  10 mg, furosemide  20 mg daily, isosorbide  15 mg daily, Entresto  twice daily, spironolactone  25 mg daily.  Patient is followed by cardiology he is seen by Dr. Darliss  DM2: Patient currently maintained on semaglutide  2 mg weekly, metformin  1000 mg twice daily, Lantus  22 units daily, glipizide  10 mg twice daily, Jardiance  10 mg daily. He is checking his sugars at home once a week. Has been 90-120. States 1-2 times over the past coulple weeks of resumed low blood sugar  HLD: Currently maintained on simvastatin  40 mg daily  GERD: Currently maintained on omeprazole  20 mg daily  OSA: Patient currently followed by Dr. Shlomo and awaiting CPAP?  for complete physical and follow up of chronic conditions.  Immunizations: -Tetanus: Completed in 2023 -Influenza: Out of season -Shingles: Completed Shingrix  series -Pneumonia: Declined  Diet: Fair diet. He is cutting back on portions. He is eating 2-3 a day. He tries to avoid snacks. He will do it once a week with a snack. Healthy version for snacks Exercise:  Tries to walk everyday   Eye exam: Completes annually.glasses Dental exam: Completes semi-annually    Colonoscopy: Completed in 09/03/2019, repeat 3 years.  Patient overdue. [facility] Lung Cancer Screening: N/A  PSA: Due  Sleep: goes to bed around 11-12 and gets up around 7. States that he has done the sleep study. Has not heard about a machine.      Review of Systems  Constitutional:  Negative for chills and fever.  Respiratory:  Negative for shortness of breath.   Cardiovascular:  Negative for chest pain and leg swelling.  Gastrointestinal:  Negative for abdominal pain, blood in stool,  constipation, diarrhea, nausea and vomiting.       BM daily   Genitourinary:  Negative for dysuria and hematuria.  Neurological:  Negative for dizziness, tingling and headaches.  Psychiatric/Behavioral:  Negative for hallucinations and suicidal ideas.       Objective:     BP 118/80   Pulse 88   Temp 97.8 F (36.6 C) (Oral)   Ht 5' 9 (1.753 m)   Wt 225 lb 12.8 oz (102.4 kg)   SpO2 96%   BMI 33.34 kg/m  BP Readings from Last 3 Encounters:  07/11/24 118/80  03/28/24 124/78  12/07/23 120/84   Wt Readings from Last 3 Encounters:  07/11/24 225 lb 12.8 oz (102.4 kg)  06/12/24 238 lb (108 kg)  03/28/24 238 lb 9.6 oz (108.2 kg)   SpO2 Readings from Last 3 Encounters:  07/11/24 96%  03/28/24 95%  12/07/23 97%      Physical Exam Vitals and nursing note reviewed.  Constitutional:      Appearance: Normal appearance.  HENT:     Right Ear: Tympanic membrane, ear canal and external ear normal.     Left Ear: Tympanic membrane, ear canal and external ear normal.     Mouth/Throat:     Mouth: Mucous membranes are moist.     Pharynx: Oropharynx is clear.  Eyes:     Extraocular Movements: Extraocular movements intact.     Pupils: Pupils are equal, round, and reactive to light.  Cardiovascular:     Rate and Rhythm: Normal rate and regular rhythm.  Pulses: Normal pulses.     Heart sounds: Normal heart sounds.  Pulmonary:     Effort: Pulmonary effort is normal.     Breath sounds: Normal breath sounds.  Abdominal:     General: Bowel sounds are normal. There is no distension.     Palpations: There is no mass.     Tenderness: There is no abdominal tenderness.     Hernia: No hernia is present.  Musculoskeletal:     Right lower leg: No edema.     Left lower leg: No edema.  Lymphadenopathy:     Cervical: No cervical adenopathy.  Skin:    General: Skin is warm.  Neurological:     General: No focal deficit present.     Mental Status: He is alert.     Deep Tendon Reflexes:      Reflex Scores:      Bicep reflexes are 2+ on the right side and 2+ on the left side.      Patellar reflexes are 2+ on the right side and 2+ on the left side.    Comments: Bilateral upper and lower extremity strength 5/5  Psychiatric:        Mood and Affect: Mood normal.        Behavior: Behavior normal.        Thought Content: Thought content normal.        Judgment: Judgment normal.     Title   Diabetic Foot Exam - detailed Is there a history of foot ulcer?: No Is there a foot ulcer now?: No Is there swelling?: No Is there elevated skin temperature?: No Is there abnormal foot shape?: No Is there a claw toe deformity?: No Are the toenails long?: No Are the toenails thick?: No Are the toenails ingrown?: No Pulse Foot Exam completed.: Yes   Right Posterior Tibialis: Present Left posterior Tibialis: Present   Right Dorsalis Pedis: Present Left Dorsalis Pedis: Present     Sensory Foot Exam Completed.: Yes Semmes-Weinstein Monofilament Test + means has sensation and - means no sensation      Image components are not supported.   Image components are not supported. Image components are not supported.  Tuning Fork Comments All 10 sites tested sensation intact bilaterally     Results for orders placed or performed in visit on 07/11/24  POCT glycosylated hemoglobin (Hb A1C)  Result Value Ref Range   Hemoglobin A1C 7.1 (A) 4.0 - 5.6 %   HbA1c POC (<> result, manual entry)     HbA1c, POC (prediabetic range)     HbA1c, POC (controlled diabetic range)        The ASCVD Risk score (Arnett DK, et al., 2019) failed to calculate for the following reasons:   The valid total cholesterol range is 130 to 320 mg/dL    Assessment & Plan:   Problem List Items Addressed This Visit       Cardiovascular and Mediastinum   Chronic combined systolic and diastolic congestive heart failure (HCC) (Chronic)   Currently followed by cardiology on carvedilol  25, Jardiance  10,  furosemide  20, isosorbide  15, Entresto  twice daily, spironolactone  25 mg daily      Essential hypertension   Patient currently maintained on carvedilol  25 mg twice daily, furosemide  20 mg daily, isosorbide  15 mg daily, spironolactone  25 mg daily.  Continue taking medication as prescribed follow-up with cardiology as recommended      Relevant Orders   CBC   Comprehensive metabolic panel with GFR  Lipid panel     Endocrine   Controlled type 2 diabetes mellitus without complication, with long-term current use of insulin  [facility])   Patient currently only on Lantus  22 units daily, glipizide  10 mg twice daily, Jardiance  10 mg daily, and Ozempic  2 mg weekly.  Patient's A1c trended up slightly to 7.1%.  We will not change medications he will work on lifestyle modifications inclusive of limiting plain/processed sugars.      Relevant Orders   POCT glycosylated hemoglobin (Hb A1C) (Completed)   CBC   Comprehensive metabolic panel with GFR   Microalbumin / creatinine urine ratio   Lipid panel     Other   Hyperlipidemia   History of the same.  Patient maintained on simvastatin  40 mg daily.  Pending lipid panel      Relevant Orders   Lipid panel   Preventative health care - Primary   Discussed age-appropriate immunizations and screening exams.  Did review patient's personal, surgical, social, family histories.  Patient is up-to-date on all age-appropriate vaccinations he would like.  Declined Prevnar 20 today.  Ambulatory referral to GI for CRC screening.  PSA for prostate cancer screening today.  Patient was given information at discharge about preventative healthcare maintenance with anticipatory guidance.      Other Visit Diagnoses       Screening for prostate cancer       Relevant Orders   PSA     Screening for colon cancer       Relevant Orders   Ambulatory referral to Gastroenterology       Return in about 3 months (around 10/11/2024) for DM recheck.    Adina Crandall, NP

## 2024-07-11 NOTE — Patient Instructions (Signed)
 Nice to see you today I will be in touch with the labs once I have them Follow up with me in 3 months, sooner If you need me

## 2024-07-11 NOTE — Assessment & Plan Note (Signed)
 Currently followed by cardiology on carvedilol  25, Jardiance  10, furosemide  20, isosorbide  15, Entresto  twice daily, spironolactone  25 mg daily

## 2024-07-12 ENCOUNTER — Ambulatory Visit: Payer: Self-pay | Admitting: Nurse Practitioner

## 2024-07-27 ENCOUNTER — Other Ambulatory Visit: Payer: Self-pay | Admitting: Nurse Practitioner

## 2024-07-27 DIAGNOSIS — Z794 Long term (current) use of insulin: Secondary | ICD-10-CM

## 2024-08-24 ENCOUNTER — Other Ambulatory Visit: Payer: Self-pay | Admitting: Cardiology

## 2024-08-24 ENCOUNTER — Other Ambulatory Visit: Payer: Self-pay | Admitting: Nurse Practitioner

## 2024-08-24 DIAGNOSIS — E1165 Type 2 diabetes mellitus with hyperglycemia: Secondary | ICD-10-CM

## 2024-09-06 ENCOUNTER — Encounter: Payer: Self-pay | Admitting: Gastroenterology

## 2024-09-21 ENCOUNTER — Other Ambulatory Visit: Payer: Self-pay | Admitting: Nurse Practitioner

## 2024-09-21 DIAGNOSIS — E1165 Type 2 diabetes mellitus with hyperglycemia: Secondary | ICD-10-CM

## 2024-09-21 DIAGNOSIS — I428 Other cardiomyopathies: Secondary | ICD-10-CM

## 2024-09-21 DIAGNOSIS — I10 Essential (primary) hypertension: Secondary | ICD-10-CM

## 2024-09-23 ENCOUNTER — Other Ambulatory Visit: Payer: Self-pay | Admitting: Cardiology

## 2024-10-17 ENCOUNTER — Encounter: Admitting: Nurse Practitioner

## 2024-10-18 ENCOUNTER — Ambulatory Visit: Payer: Self-pay | Admitting: Cardiology

## 2024-10-24 ENCOUNTER — Ambulatory Visit (INDEPENDENT_AMBULATORY_CARE_PROVIDER_SITE_OTHER): Admitting: Nurse Practitioner

## 2024-10-24 VITALS — BP 130/80 | HR 84 | Temp 98.0°F | Ht 69.0 in | Wt 230.6 lb

## 2024-10-24 DIAGNOSIS — Z794 Long term (current) use of insulin: Secondary | ICD-10-CM

## 2024-10-24 DIAGNOSIS — L989 Disorder of the skin and subcutaneous tissue, unspecified: Secondary | ICD-10-CM

## 2024-10-24 DIAGNOSIS — E119 Type 2 diabetes mellitus without complications: Secondary | ICD-10-CM

## 2024-10-24 LAB — POCT GLYCOSYLATED HEMOGLOBIN (HGB A1C): Hemoglobin A1C: 6.5 % — AB (ref 4.0–5.6)

## 2024-10-24 MED ORDER — NYSTATIN 100000 UNIT/GM EX CREA: 1.0000 | TOPICAL_CREAM | Freq: Two times a day (BID) | CUTANEOUS | 1 refills | Status: AC

## 2024-10-24 NOTE — Patient Instructions (Signed)
 Nice to see you today  I have sent a cream to the pharmacy  Let me know if it does not improve Follow up with me in 6 months, sooner if you need me

## 2024-10-24 NOTE — Progress Notes (Signed)
 Established Patient Office Visit  Subjective   Patient ID: Alexander Currier., male    DOB: Dec 06, 1966  Age: 58 y.o. MRN: [id number]  Chief Complaint  Patient presents with   Diabetes    Discussed the use of AI scribe software for clinical note transcription with the patient, who gave verbal consent to proceed.  History of Present Illness Alexander Laduca. is a 58 year old male with diabetes who presents for a follow-up on blood sugar management.  He monitors his blood sugar levels at home, noting a low of 70-79 mg/dL once in the morning, which he managed by eating and it returned to normal. He usually maintains levels around 90-100 mg/dL and checks his sugar twice a week. He is currently on a regimen of Lantus  at 24 units, metformin , and Ozempic , with no reported side effects such as constipation or abdominal pain. He consumes at least three meals a day and has been snacking more recently, which he wants to reduce. His weight has fluctuated between 225 and 238 pounds, currently around 230 pounds. His A1c is 6.5%; previously, it was 7.1%.  He describes a raw area on his penis, particularly where the foreskin retracts, and has been using hydrocortisone cream. He is not circumcised and has experienced similar issues in the past, using antifungal cream previously.  He has not received a flu shot this season and is uncertain about needing one. He snacks at truck stops and has cut out chips and chocolate nuggets from his diet, which he attributes to his improved A1c.     Review of Systems  Constitutional:  Negative for chills and fever.  Respiratory:  Negative for shortness of breath.   Cardiovascular:  Negative for chest pain.  Skin:        + skin lesion   Neurological:  Negative for headaches.  Psychiatric/Behavioral:  Negative for hallucinations and suicidal ideas.       Objective:     BP 130/80   Pulse 84   Temp 98 F (36.7 C) (Oral)   Ht 5' 9 (1.753 m)   Wt 230 lb 9.6 oz  (104.6 kg)   SpO2 97%   BMI 34.05 kg/m  BP Readings from Last 3 Encounters:  10/24/24 130/80  07/11/24 118/80  03/28/24 124/78   Wt Readings from Last 3 Encounters:  10/24/24 230 lb 9.6 oz (104.6 kg)  07/11/24 225 lb 12.8 oz (102.4 kg)  06/12/24 238 lb (108 kg)   SpO2 Readings from Last 3 Encounters:  10/24/24 97%  07/11/24 96%  03/28/24 95%      Physical Exam Vitals and nursing note reviewed. Exam conducted with a chaperone present Devere Hummer, CMA).  Constitutional:      Appearance: Normal appearance.  Cardiovascular:     Rate and Rhythm: Normal rate and regular rhythm.     Heart sounds: Normal heart sounds.  Pulmonary:     Effort: Pulmonary effort is normal.     Breath sounds: Normal breath sounds.  Genitourinary:    Penis: Uncircumcised.      Comments: Skin openings due to moisture along the distal part of foreskin Neurological:     Mental Status: He is alert.      Results for orders placed or performed in visit on 10/24/24  POCT glycosylated hemoglobin (Hb A1C)  Result Value Ref Range   Hemoglobin A1C 6.5 (A) 4.0 - 5.6 %   HbA1c POC (<> result, manual entry)     HbA1c, POC (prediabetic range)  HbA1c, POC (controlled diabetic range)        The 10-year ASCVD risk score (Arnett DK, et al., 2019) is: 22.3%    Assessment & Plan:   Problem List Items Addressed This Visit       Endocrine   Controlled type 2 diabetes mellitus without complication, with long-term current use of insulin  (HCC) - Primary   Relevant Orders   POCT glycosylated hemoglobin (Hb A1C) (Completed)   Other Visit Diagnoses       Skin lesion       Relevant Medications   nystatin cream (MYCOSTATIN)      Assessment and Plan Assessment & Plan Type 2 diabetes mellitus, on insulin  Diabetes well-controlled with A1c of 6.5%. Occasional morning hypoglycemia resolved with food. Improved dietary habits aiding glycemic control. - Continue Lantus , Metformin , and Ozempic . -  Monitor blood glucose regularly, especially during hypoglycemia. - Contact provider for frequent hypoglycemia for possible insulin  adjustment. - Maintain dietary modifications.  Intertrigo of the foreskin (uncircumcised penis) Intertrigo due to moisture and skin folding. Previous hydrocortisone provided temporary relief. Discussed recurrent infection risks and circumcision possibility. - Discontinued hydrocortisone cream. - Prescribed nystatin cream twice daily. - Advised to keep area dry and clean. - Discuss circumcision with urologist if recurrent infections occur.  Hyperlipidemia Cholesterol levels within acceptable range with current treatment. - Continue current lipid-lowering therapy.  Return in about 6 months (around 04/23/2025) for DM recheck.    Adina Crandall, NP

## 2024-10-31 ENCOUNTER — Other Ambulatory Visit: Payer: Self-pay | Admitting: Cardiology

## 2024-11-22 ENCOUNTER — Other Ambulatory Visit: Payer: Self-pay | Admitting: Urology

## 2024-12-19 NOTE — Patient Instructions (Signed)
 SURGICAL WAITING ROOM VISITATION Patients having surgery or a procedure may have no more than 2 support people in the waiting area - these visitors may rotate in the visitor waiting room.   Due to an increase in RSV and influenza rates and associated hospitalizations, children ages 71 and under may not visit patients in [facility] hospitals. If the patient needs to stay at the hospital during part of their recovery, the visitor guidelines for inpatient rooms apply.  PRE-OP VISITATION  Pre-op nurse will coordinate an appropriate time for 1 support person to accompany the patient in pre-op.  This support person may not rotate.  This visitor will be contacted when the time is appropriate for the visitor to come back in the pre-op area.  Please refer to the [facility] website for the visitor guidelines for Inpatients (after your surgery is over and you are in a regular room).  You are not required to quarantine at this time prior to your surgery. However, you must do this: Hand Hygiene often Do NOT share personal items Notify your provider if you are in close contact with someone who has COVID or you develop fever 100.4 or greater, new onset of sneezing, cough, sore throat, shortness of breath or body aches.  If you test positive for Covid or have been in contact with anyone that has tested positive in the last 10 days please notify you surgeon.    Your procedure is scheduled on:  01/06/25  Report to [facility] Main Entrance: [place] entrance where the [employer] is available.   Report to admitting at:  5:15 AM  Call this number if you have any questions or problems the morning of surgery [phone]  FOLLOW ANY ADDITIONAL PRE OP INSTRUCTIONS YOU RECEIVED FROM YOUR SURGEON'S OFFICE!!!  Do not eat food or drink fluids after Midnight the night prior to your surgery/procedure.   Oral Hygiene is also important to reduce your risk of infection.        Remember - BRUSH YOUR TEETH  THE MORNING OF SURGERY WITH YOUR REGULAR TOOTHPASTE  Do NOT smoke after Midnight the night before surgery.  STOP TAKING all Vitamins, Herbs and supplements 1 week before your surgery.   Take ONLY these medicines the morning of surgery with A SIP OF WATER: isosorbide ,pantoprazole.Use inhalers as usual and bring them. How to Manage Your Diabetes Before and After Surgery  Why is it important to control my blood sugar before and after surgery? Improving blood sugar levels before and after surgery helps healing and can limit problems. A way of improving blood sugar control is eating a healthy diet by:  Eating less sugar and carbohydrates  Increasing activity/exercise  Talking with your doctor about reaching your blood sugar goals High blood sugars (greater than 180 mg/dL) can raise your risk of infections and slow your recovery, so you will need to focus on controlling your diabetes during the weeks before surgery. Make sure that the doctor who takes care of your diabetes knows about your planned surgery including the date and location.  How do I manage my blood sugar before surgery? Check your blood sugar at least 4 times a day, starting 2 days before surgery, to make sure that the level is not too high or low. Check your blood sugar the morning of your surgery when you wake up and every 2 hours until you get to the Short Stay unit. If your blood sugar is less than 70 mg/dL, you will need to  treat for low blood sugar: Do not take insulin . Treat a low blood sugar (less than 70 mg/dL) with  cup of clear juice (cranberry or apple), 4 glucose tablets, OR glucose gel. Recheck blood sugar in 15 minutes after treatment (to make sure it is greater than 70 mg/dL). If your blood sugar is not greater than 70 mg/dL on recheck, call [phone] for further instructions. Report your blood sugar to the short stay nurse when you get to Short Stay.  If you are admitted to the hospital after surgery: Your  blood sugar will be checked by the staff and you will probably be given insulin  after surgery (instead of oral diabetes medicines) to make sure you have good blood sugar levels. The goal for blood sugar control after surgery is 80-180 mg/dL.   WHAT DO I DO ABOUT MY DIABETES MEDICATION?  HOLD jardiance  after:01/02/25  THE NIGHT BEFORE SURGERY, take ONLY half of lantus  insulin  dose (12 units). DO NOT take glipizide  evening dose.     THE MORNING OF SURGERY,Do not take oral diabetes medicines (pills) the morning of surgery.  DO NOT TAKE THE FOLLOWING 7 DAYS PRIOR TO SURGERY: Ozempic , Wegovy , Rybelsus  (Semaglutide ), Byetta (exenatide), Bydureon (exenatide ER), Victoza, Saxenda (liraglutide), or Trulicity (dulaglutide) Mounjaro (Tirzepatide) Adlyxin (Lixisenatide), Polyethylene Glycol Loxenatide. HOLD semaglutide  after: 12/29/24                   You may not have any metal on your body including hair pins, jewelry, and body piercing  Do not wear lotions, powders, perfumes / cologne, or deodorant  Men may shave face and neck.  Contacts, Hearing Aids, dentures or bridgework may not be worn into surgery. DENTURES WILL BE REMOVED PRIOR TO SURGERY PLEASE DO NOT APPLY Poly grip OR ADHESIVES!!!  You may bring a small overnight bag with you on the day of surgery, only pack items that are not valuable. [facility] IS NOT RESPONSIBLE   FOR VALUABLES THAT ARE LOST OR STOLEN.   Patients discharged on the day of surgery will not be allowed to drive home.  Someone NEEDS to stay with you for the first 24 hours after anesthesia.  Do not bring your home medications to the hospital. The Pharmacy will dispense medications listed on your medication list to you during your admission in the Hospital.  Special Instructions: Bring a copy of your healthcare power of attorney and living will documents the day of surgery, if you wish to have them scanned into your [facility] Records- EPIC  Please read over the  following fact sheets you were given: IF YOU HAVE QUESTIONS ABOUT YOUR PRE-OP INSTRUCTIONS, PLEASE CALL [phone]   [facility] - Preparing for Surgery      Before surgery, you can play an important role.  Because skin is not sterile, your skin needs to be as free of germs as possible.  You can reduce the number of germs on your skin by washing with CHG (chlorahexidine gluconate) soap before surgery.  CHG is an antiseptic cleaner which kills germs and bonds with the skin to continue killing germs even after washing. Please DO NOT use if you have an allergy to CHG or antibacterial soaps.  If your skin becomes reddened/irritated stop using the CHG and inform your nurse when you arrive at Short Stay. Do not shave (including legs and underarms) for at least 48 hours prior to the first CHG shower.  You may shave your face/neck.  Please follow these instructions carefully:  1.  Shower with CHG Soap the night before surgery ONLY (DO NOT USE THE SOAP THE MORNING OF SURGERY).  2.  If you choose to wash your hair, wash your hair first as usual with your normal  shampoo.  3.  After you shampoo, rinse your hair and body thoroughly to remove the shampoo.                             4.  Use CHG as you would any other liquid soap.  You can apply chg directly to the skin and wash.  Gently with a scrungie or clean washcloth.  5.  Apply the CHG Soap to your body ONLY FROM THE NECK DOWN.   Do not use on face/ open                           Wound or open sores. Avoid contact with eyes, ears mouth and genitals (private parts).                       Wash face,  Genitals (private parts) with your normal soap.             6.  Wash thoroughly, paying special attention to the area where your  surgery  will be performed.  7.  Thoroughly rinse your body with warm water from the neck down.  8.  DO NOT shower/wash with your normal soap after using and rinsing off the CHG Soap.                9.  Pat yourself dry with a  clean towel.            10.  Wear clean pajamas.            11.  Place clean sheets on your bed the night of your first shower and do not  sleep with pets.  Day of Surgery : Do not apply any CHG, lotions/deodorants the morning of surgery.  Please wear clean clothes to the hospital/surgery center.   FAILURE TO FOLLOW THESE INSTRUCTIONS MAY RESULT IN THE CANCELLATION OF YOUR SURGERY  PATIENT SIGNATURE_________________________________  NURSE SIGNATURE__________________________________  ________________________________________________________________________

## 2024-12-26 ENCOUNTER — Other Ambulatory Visit: Payer: Self-pay

## 2024-12-26 ENCOUNTER — Encounter (HOSPITAL_COMMUNITY): Payer: Self-pay

## 2024-12-26 ENCOUNTER — Encounter (HOSPITAL_COMMUNITY)
Admission: RE | Admit: 2024-12-26 | Discharge: 2024-12-26 | Disposition: A | Discharge status: Home / Self Care | Source: Ambulatory Visit | Attending: Urology | Admitting: Urology

## 2024-12-26 VITALS — BP 140/87 | HR 84 | Temp 98.2°F | Ht 69.0 in | Wt 222.0 lb

## 2024-12-26 DIAGNOSIS — K219 Gastro-esophageal reflux disease without esophagitis: Secondary | ICD-10-CM | POA: Insufficient documentation

## 2024-12-26 DIAGNOSIS — Z7984 Long term (current) use of oral hypoglycemic drugs: Secondary | ICD-10-CM | POA: Diagnosis not present

## 2024-12-26 DIAGNOSIS — I428 Other cardiomyopathies: Secondary | ICD-10-CM | POA: Insufficient documentation

## 2024-12-26 DIAGNOSIS — Z01812 Encounter for preprocedural laboratory examination: Secondary | ICD-10-CM | POA: Diagnosis present

## 2024-12-26 DIAGNOSIS — G4733 Obstructive sleep apnea (adult) (pediatric): Secondary | ICD-10-CM | POA: Insufficient documentation

## 2024-12-26 DIAGNOSIS — Z794 Long term (current) use of insulin: Secondary | ICD-10-CM | POA: Diagnosis not present

## 2024-12-26 DIAGNOSIS — N471 Phimosis: Secondary | ICD-10-CM | POA: Diagnosis not present

## 2024-12-26 DIAGNOSIS — E119 Type 2 diabetes mellitus without complications: Secondary | ICD-10-CM | POA: Diagnosis not present

## 2024-12-26 DIAGNOSIS — I110 Hypertensive heart disease with heart failure: Secondary | ICD-10-CM | POA: Diagnosis not present

## 2024-12-26 DIAGNOSIS — Z01818 Encounter for other preprocedural examination: Secondary | ICD-10-CM | POA: Diagnosis present

## 2024-12-26 DIAGNOSIS — I10 Essential (primary) hypertension: Secondary | ICD-10-CM

## 2024-12-26 DIAGNOSIS — I509 Heart failure, unspecified: Secondary | ICD-10-CM | POA: Insufficient documentation

## 2024-12-26 LAB — CBC
HCT: 44.1 % (ref 39.0–52.0)
Hemoglobin: 14.8 g/dL (ref 13.0–17.0)
MCH: 30 pg (ref 26.0–34.0)
MCHC: 33.6 g/dL (ref 30.0–36.0)
MCV: 89.3 fL (ref 80.0–100.0)
Platelets: 213 K/uL (ref 150–400)
RBC: 4.94 MIL/uL (ref 4.22–5.81)
RDW: 13.8 % (ref 11.5–15.5)
WBC: 5 K/uL (ref 4.0–10.5)
nRBC: 0 % (ref 0.0–0.2)

## 2024-12-26 LAB — BASIC METABOLIC PANEL WITH GFR
Anion gap: 13 (ref 5–15)
BUN: 9 mg/dL (ref 6–20)
CO2: 22 mmol/L (ref 22–32)
Calcium: 8.9 mg/dL (ref 8.9–10.3)
Chloride: 106 mmol/L (ref 98–111)
Creatinine, Ser: 0.86 mg/dL (ref 0.61–1.24)
GFR, Estimated: >60 mL/min
Glucose, Bld: 169 mg/dL — ABNORMAL HIGH (ref 70–99)
Potassium: 3.8 mmol/L (ref 3.5–5.1)
Sodium: 141 mmol/L (ref 135–145)

## 2024-12-26 LAB — GLUCOSE, CAPILLARY: Glucose-Capillary: 169 mg/dL — ABNORMAL HIGH (ref 70–99)

## 2024-12-26 NOTE — Progress Notes (Signed)
 For Anesthesia: PCP - Wendee Lynwood HERO, NP  Cardiologist - Darliss Rogue, MD . ARNETTA: 03/28/24  Bowel Prep reminder:  Chest x-ray -  EKG - 03/28/24 Stress Test -  ECHO - 02/29/24 Cardiac Cath - 04/30/20 Pacemaker/ICD device last checked: Pacemaker orders received: Device Rep notified:  Spinal Cord Stimulator:N/A  Sleep Study - Yes CPAP - Yes  Fasting Blood Sugar - N/A Checks Blood Sugar __2___ times a week Date and result of last Hgb A1c-6.5: 10/24/24  Last dose of GLP1 agonist- semaglutide : Last dose: 12/25/24 GLP1 instructions: Hold 7 days prior to schedule (Hold 24 hours-daily)   Last dose of SGLT-2 inhibitors- Jardiance  SGLT-2 instructions: Hold 72 hours prior to surgery: after: 01/02/25  Blood Thinner Instructions:N/A Last Dose: Time last taken:  Aspirin  Instructions: Last Dose: Time last taken:  Activity level: Can go up a flight of stairs and activities of daily living without stopping and without chest pain and/or shortness of breath   Able to exercise without chest pain and/or shortness of breath  Anesthesia review: Hx: HTN,CHF,DIA,OSA(CPAP).  Patient denies shortness of breath, fever, cough and chest pain at PAT appointment   Patient verbalized understanding of instructions that were reviewed over the telephone.

## 2024-12-28 ENCOUNTER — Encounter (HOSPITAL_COMMUNITY): Payer: Self-pay
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
## 2024-12-28 NOTE — Anesthesia Preprocedure Evaluation (Addendum)
"                                    Anesthesia Evaluation  Patient identified by MRN, date of birth, ID band Patient awake    Reviewed: Allergy & Precautions, NPO status , Patient's Chart, lab work & pertinent test results, reviewed documented beta blocker date and time   History of Anesthesia Complications Negative for: history of anesthetic complications  Airway Mallampati: I  TM Distance: >3 FB Neck ROM: Full    Dental  (+) Missing, Dental Advisory Given   Pulmonary sleep apnea and Continuous Positive Airway Pressure Ventilation    breath sounds clear to auscultation       Cardiovascular hypertension, Pt. on medications and Pt. on home beta blockers +CHF (Entresto )  (-) CAD  Rhythm:Regular Rate:Normal  02/2024 ECHO: EF 35 to 40%.  1.The LV has moderately decreased function. The left ventricle demonstrates global hypokinesis. Grade I diastolic dysfunction (impaired relaxation). The average left ventricular global longitudinal strain is -11.4 %. The global longitudinal strain is  abnormal.   2. RVF is normal. The right ventricular size is normal.   3. The mitral valve is normal in structure. Mild mitral valve regurgitation. No evidence of mitral stenosis.   4. The aortic valve is tricuspid. Aortic valve regurgitation is moderate. No aortic stenosis is present.     Neuro/Psych negative neurological ROS     GI/Hepatic Neg liver ROS,GERD  Medicated and Controlled,,  Endo/Other  diabetes (glu 126), Oral Hypoglycemic Agents, Insulin  Dependent  Semaglutide : 10d ago  Renal/GU negative Renal ROS     Musculoskeletal   Abdominal   Peds  Hematology Hb 14.8, plt 213k   Anesthesia Other Findings   Reproductive/Obstetrics                              Anesthesia Physical Anesthesia Plan  ASA: 3  Anesthesia Plan: General   Post-op Pain Management: Tylenol  PO (pre-op)*   Induction: Intravenous  PONV Risk Score and Plan: 2 and  Ondansetron  and Dexamethasone   Airway Management Planned: Oral ETT  Additional Equipment: None  Intra-op Plan:   Post-operative Plan: Extubation in OR  Informed Consent: I have reviewed the patients History and Physical, chart, labs and discussed the procedure including the risks, benefits and alternatives for the proposed anesthesia with the patient or authorized representative who has indicated his/her understanding and acceptance.     Dental advisory given  Plan Discussed with: CRNA and Surgeon  Anesthesia Plan Comments: (See PAT note from 1/5)         Anesthesia Quick Evaluation  "

## 2025-01-06 ENCOUNTER — Ambulatory Visit (HOSPITAL_COMMUNITY): Admitting: Anesthesiology

## 2025-01-06 ENCOUNTER — Ambulatory Visit (HOSPITAL_COMMUNITY): Payer: Self-pay | Admitting: Medical

## 2025-01-06 ENCOUNTER — Ambulatory Visit (HOSPITAL_COMMUNITY)
Admission: RE | Admit: 2025-01-06 | Discharge: 2025-01-06 | Disposition: A | Discharge status: Home / Self Care | Source: Ambulatory Visit | Attending: Urology | Admitting: Urology

## 2025-01-06 ENCOUNTER — Encounter (HOSPITAL_COMMUNITY): Payer: Self-pay | Admitting: Urology

## 2025-01-06 ENCOUNTER — Encounter (HOSPITAL_COMMUNITY)
Admission: RE | Disposition: A | Discharge status: Home / Self Care | Payer: Self-pay | Source: Ambulatory Visit | Attending: Urology

## 2025-01-06 DIAGNOSIS — I5022 Chronic systolic (congestive) heart failure: Secondary | ICD-10-CM | POA: Diagnosis not present

## 2025-01-06 DIAGNOSIS — I110 Hypertensive heart disease with heart failure: Secondary | ICD-10-CM

## 2025-01-06 DIAGNOSIS — Z7984 Long term (current) use of oral hypoglycemic drugs: Secondary | ICD-10-CM | POA: Diagnosis not present

## 2025-01-06 DIAGNOSIS — E119 Type 2 diabetes mellitus without complications: Secondary | ICD-10-CM

## 2025-01-06 DIAGNOSIS — N471 Phimosis: Secondary | ICD-10-CM

## 2025-01-06 DIAGNOSIS — G4730 Sleep apnea, unspecified: Secondary | ICD-10-CM | POA: Diagnosis not present

## 2025-01-06 DIAGNOSIS — Z794 Long term (current) use of insulin: Secondary | ICD-10-CM | POA: Diagnosis not present

## 2025-01-06 DIAGNOSIS — Z7985 Long-term (current) use of injectable non-insulin antidiabetic drugs: Secondary | ICD-10-CM | POA: Diagnosis not present

## 2025-01-06 DIAGNOSIS — K219 Gastro-esophageal reflux disease without esophagitis: Secondary | ICD-10-CM | POA: Diagnosis not present

## 2025-01-06 HISTORY — PX: CIRCUMCISION: SHX1350

## 2025-01-06 LAB — GLUCOSE, CAPILLARY
Glucose-Capillary: 126 mg/dL — ABNORMAL HIGH (ref 70–99)
Glucose-Capillary: 94 mg/dL (ref 70–99)

## 2025-01-06 MED ORDER — CHLORHEXIDINE GLUCONATE 0.12 % MT SOLN
15.0000 mL | Freq: Once | OROMUCOSAL | Status: AC
  Administered 2025-01-06: 15 mL via OROMUCOSAL

## 2025-01-06 MED ORDER — ROCURONIUM BROMIDE 100 MG/10ML IV SOLN
INTRAVENOUS | Status: DC | PRN
  Administered 2025-01-06: 50 mg via INTRAVENOUS

## 2025-01-06 MED ORDER — MIDAZOLAM HCL (PF) 2 MG/2ML IJ SOLN: 0.5000 mg | Freq: Once | INTRAMUSCULAR | Status: DC | PRN

## 2025-01-06 MED ORDER — CEFAZOLIN SODIUM-DEXTROSE 2-4 GM/100ML-% IV SOLN
2.0000 g | INTRAVENOUS | Status: DC
  Filled 2025-01-06: qty 100

## 2025-01-06 MED ORDER — MIDAZOLAM HCL 5 MG/5ML IJ SOLN
INTRAMUSCULAR | Status: DC | PRN
  Administered 2025-01-06: 2 mg via INTRAVENOUS

## 2025-01-06 MED ORDER — MIDAZOLAM HCL 2 MG/2ML IJ SOLN
INTRAMUSCULAR | Status: AC
  Filled 2025-01-06: qty 2

## 2025-01-06 MED ORDER — LIDOCAINE HCL (PF) 2 % IJ SOLN
INTRAMUSCULAR | Status: AC
  Filled 2025-01-06: qty 5

## 2025-01-06 MED ORDER — BUPIVACAINE HCL (PF) 0.25 % IJ SOLN
INTRAMUSCULAR | Status: DC | PRN
  Administered 2025-01-06: 20 mL

## 2025-01-06 MED ORDER — FENTANYL CITRATE (PF) 100 MCG/2ML IJ SOLN
INTRAMUSCULAR | Status: AC
  Filled 2025-01-06: qty 2

## 2025-01-06 MED ORDER — ONDANSETRON HCL 4 MG/2ML IJ SOLN
INTRAMUSCULAR | Status: DC | PRN
  Administered 2025-01-06: 4 mg via INTRAVENOUS

## 2025-01-06 MED ORDER — OXYCODONE HCL 5 MG/5ML PO SOLN: 5.0000 mg | Freq: Once | ORAL | Status: DC | PRN

## 2025-01-06 MED ORDER — PROPOFOL 10 MG/ML IV BOLUS
INTRAVENOUS | Status: AC
  Filled 2025-01-06: qty 20

## 2025-01-06 MED ORDER — 0.9 % SODIUM CHLORIDE (POUR BTL) OPTIME
TOPICAL | Status: DC | PRN
  Administered 2025-01-06: 1000 mL

## 2025-01-06 MED ORDER — LACTATED RINGERS IV SOLN: INTRAVENOUS | Status: DC | PRN

## 2025-01-06 MED ORDER — MEPERIDINE HCL 25 MG/ML IJ SOLN: 6.2500 mg | INTRAMUSCULAR | Status: DC | PRN

## 2025-01-06 MED ORDER — OXYCODONE HCL 5 MG PO TABS: 5.0000 mg | ORAL_TABLET | Freq: Once | ORAL | Status: DC | PRN

## 2025-01-06 MED ORDER — FENTANYL CITRATE (PF) 100 MCG/2ML IJ SOLN
INTRAMUSCULAR | Status: DC | PRN
  Administered 2025-01-06 (×2): 50 ug via INTRAVENOUS

## 2025-01-06 MED ORDER — PHENYLEPHRINE HCL-NACL 20-0.9 MG/250ML-% IV SOLN
INTRAVENOUS | Status: AC
  Filled 2025-01-06: qty 500

## 2025-01-06 MED ORDER — ORAL CARE MOUTH RINSE: 15.0000 mL | Freq: Once | OROMUCOSAL | Status: AC

## 2025-01-06 MED ORDER — LACTATED RINGERS IV SOLN: INTRAVENOUS | Status: DC

## 2025-01-06 MED ORDER — SUGAMMADEX SODIUM 200 MG/2ML IV SOLN
INTRAVENOUS | Status: DC | PRN
  Administered 2025-01-06: 200 mg via INTRAVENOUS

## 2025-01-06 MED ORDER — SENNOSIDES-DOCUSATE SODIUM 8.6-50 MG PO TABS: 1.0000 | ORAL_TABLET | Freq: Two times a day (BID) | ORAL | 0 refills | Status: AC

## 2025-01-06 MED ORDER — HYDROMORPHONE HCL 1 MG/ML IJ SOLN: 0.2500 mg | INTRAMUSCULAR | Status: DC | PRN

## 2025-01-06 MED ORDER — ACETAMINOPHEN 500 MG PO TABS: 1000.0000 mg | ORAL_TABLET | Freq: Four times a day (QID) | ORAL | 0 refills | Status: AC | PRN

## 2025-01-06 MED ORDER — BUPIVACAINE HCL (PF) 0.25 % IJ SOLN
INTRAMUSCULAR | Status: AC
  Filled 2025-01-06: qty 30

## 2025-01-06 MED ORDER — TRAMADOL HCL 50 MG PO TABS: 50.0000 mg | ORAL_TABLET | Freq: Four times a day (QID) | ORAL | 0 refills | Status: AC | PRN

## 2025-01-06 MED ORDER — PROPOFOL 10 MG/ML IV BOLUS
INTRAVENOUS | Status: DC | PRN
  Administered 2025-01-06: 100 mg via INTRAVENOUS

## 2025-01-06 MED ORDER — ROCURONIUM BROMIDE 10 MG/ML (PF) SYRINGE
PREFILLED_SYRINGE | INTRAVENOUS | Status: AC
  Filled 2025-01-06: qty 10

## 2025-01-06 MED ORDER — ACETAMINOPHEN 500 MG PO TABS
1000.0000 mg | ORAL_TABLET | Freq: Once | ORAL | Status: AC
  Administered 2025-01-06: 1000 mg via ORAL
  Filled 2025-01-06: qty 2

## 2025-01-06 MED ORDER — INSULIN ASPART 100 UNIT/ML IJ SOLN: 0.0000 [IU] | INTRAMUSCULAR | Status: DC | PRN

## 2025-01-06 MED ORDER — LIDOCAINE HCL (CARDIAC) PF 100 MG/5ML IV SOSY
PREFILLED_SYRINGE | INTRAVENOUS | Status: DC | PRN
  Administered 2025-01-06: 40 mg via INTRAVENOUS

## 2025-01-06 NOTE — Brief Op Note (Signed)
 01/06/2025  7:30 AM  8:10 AM  PATIENT:  [name].  59 y.o. male  PRE-OPERATIVE DIAGNOSIS:  phimosis  POST-OPERATIVE DIAGNOSIS:  phimosis  PROCEDURE:  Procedures: CIRCUMCISION, ADULT (N/A)  SURGEON:  Surgeons and Role:    * Manny, Ricardo KATHEE [name]., MD - Primary  PHYSICIAN ASSISTANT:   ASSISTANTS: none   ANESTHESIA:   local and general  EBL:  minimal   BLOOD ADMINISTERED:none  DRAINS: none   LOCAL MEDICATIONS USED:  MARCAINE      SPECIMEN:  Source of Specimen:  foreskin for discard  DISPOSITION OF SPECIMEN:  as above  COUNTS:  YES  TOURNIQUET:  * No tourniquets in log *  DICTATION: .Other Dictation: Dictation Number [id number]  PLAN OF CARE: Discharge to home after PACU  PATIENT DISPOSITION:  PACU - hemodynamically stable.   Delay start of Pharmacological VTE agent (>24hrs) due to surgical blood loss or risk of bleeding: not applicable

## 2025-01-06 NOTE — Op Note (Unsigned)
 [name], [name] MEDICAL RECORD NO: [id number] ACCOUNT NO: [id number] DATE OF BIRTH: 04-05-1966 FACILITY: [facility] LOCATION: [facility] PHYSICIAN: Ricardo Likens, MD  Operative Report   DATE OF PROCEDURE: 01/06/2025  SURGEON: Ricardo Likens, MD.  PREOPERATIVE DIAGNOSIS:  Phimosis.  PROCEDURE PERFORMED:  Circumcision.  ESTIMATED BLOOD LOSS:  Nil.  COMPLICATIONS:  None.  SPECIMENS:  Foreskin for discard  FINDINGS:  Mild phimosis without active balanitis.  INDICATIONS:  The patient is a pleasant 59 year old man with a long-standing history of diabetes that has been better controlled recently.  He has also had troubles with phimosis with intermittent balanitis for years.  This is becoming more bothersome.   Options were discussed for management including continued p.r.n. therapy versus topicals versus surgical approaches.  He wished to proceed with a definitive management for circumcision.  He presents for this today.  Informed consent was obtained and  placed in the medical record.   DESCRIPTION OF PROCEDURE:  The patient being identified and verified, procedure being circumcision was confirmed.  Procedure timeout was performed. Intravenous antibiotics administered.  General endotracheal anesthesia induced.  The patient was placed in  a supine position and sterile field was created, prepping and draping the patient's penis, perineum, scrotum using iodine including his inner and outer foreskin leaflet.  Sterile drapes were applied.  ___ was marked with a marking pen as was a presumed  line for the proximal collar corresponding to the un-stretched corona of the glans.  The proximal collar was then circumferentially incised.  Distal collar was developed approximately 5 mm proximal to the corona of the glans.  These were connected in the  midline and snapped x4.  The redundant preputial collar was released using cautery dissection.  The frenulum was trimmed to cosmesis and reapproximated using a  U-stitch of Vicryl x3.  At 12 o'clock and 6 o'clock, reapproximation sutures were then  applied and the skin was reapproximated using 2 separate running suture layers from the 12 o'clock and 6 o'clock positions respectively, resulted in excellent tension-free apposition and quite good cosmesis.  A dressing of Xeroform followed by Shera and  Coban was loosely applied and the procedure was terminated.  The patient tolerated the procedure well, no immediate periprocedural complications.  The patient was taken to postanesthesia care unit in stable condition.  Plan is for discharge home.   CHR D: 01/06/2025 8:13:59 am T: 01/06/2025 8:31:00 am  JOB: [id number]/ [id number]

## 2025-01-06 NOTE — Anesthesia Postprocedure Evaluation (Signed)
"   Anesthesia Post Note  Patient: Alexander [name].  Procedure(s) Performed: CIRCUMCISION, ADULT     Patient location during evaluation: PACU Anesthesia Type: General Level of consciousness: awake and alert, oriented and patient cooperative Pain management: pain level controlled Vital Signs Assessment: post-procedure vital signs reviewed and stable Respiratory status: spontaneous breathing, nonlabored ventilation and respiratory function stable Cardiovascular status: blood pressure returned to baseline and stable Postop Assessment: no apparent nausea or vomiting and able to ambulate Anesthetic complications: no   No notable events documented.  Last Vitals:  Vitals:   01/06/25 0900 01/06/25 0915  BP: (!) 147/97 (!) 152/97  Pulse: 78 88  Resp: 12 18  Temp: (!) 36.4 C   SpO2: 94% 94%    Last Pain:  Vitals:   01/06/25 0915  PainSc: 0-No pain                 Betzalel Umbarger,E. Moriya Mitchell      "

## 2025-01-06 NOTE — Discharge Instructions (Signed)
 1 - Remove dressing tomorrow morning at home. OK to shower once dressing off. No active sexual stimulation x 2 weeks.  2 - All stitches are dissolvable and will disappear in about 3 weeks.   3 - Call MD or go to ER for fever >102, severe pain / nausea / vomiting not relieved by medications, or acute change in medical status

## 2025-01-06 NOTE — Transfer of Care (Signed)
 Immediate Anesthesia Transfer of Care Note  Patient: Alexander [name].  Procedure(s) Performed: CIRCUMCISION, ADULT  Patient Location: PACU  Anesthesia Type:General  Level of Consciousness: awake, alert , and oriented  Airway & Oxygen Therapy: Patient Spontanous Breathing and Patient connected to face mask oxygen  Post-op Assessment: Report given to RN and Post -op Vital signs reviewed and stable  Post vital signs: Reviewed and stable  Last Vitals:  Vitals Value Taken Time  BP 155/111 01/06/25 08:19  Temp    Pulse 85 01/06/25 08:22  Resp 8 01/06/25 08:22  SpO2 95 % 01/06/25 08:22  Vitals shown include unfiled device data.  Last Pain:  Vitals:   01/06/25 0602  PainSc: 0-No pain         Complications: No notable events documented.
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
## 2025-01-06 NOTE — Anesthesia Procedure Notes (Signed)
 Procedure Name: Intubation Date/Time: 01/06/2025 7:40 AM  Performed by: Ryli Standlee, Corean BROCKS, CRNAPre-anesthesia Checklist: Patient identified, Emergency Drugs available, Suction available and Patient being monitored Patient Re-evaluated:Patient Re-evaluated prior to induction Oxygen Delivery Method: Circle system utilized Preoxygenation: Pre-oxygenation with 100% oxygen Induction Type: IV induction Ventilation: Mask ventilation without difficulty Laryngoscope Size: Mac and 4 Grade View: Grade II Tube type: Oral Tube size: 7.5 mm Number of attempts: 1 Airway Equipment and Method: Stylet and Oral airway Placement Confirmation: ETT inserted through vocal cords under direct vision, positive ETCO2 and breath sounds checked- equal and bilateral Secured at: 22 cm Tube secured with: Tape Dental Injury: Teeth and Oropharynx as per pre-operative assessment

## 2025-01-07 ENCOUNTER — Encounter (HOSPITAL_COMMUNITY): Payer: Self-pay | Admitting: Urology

## 2025-01-24 ENCOUNTER — Other Ambulatory Visit: Payer: Self-pay | Admitting: Nurse Practitioner

## 2025-01-24 DIAGNOSIS — E1165 Type 2 diabetes mellitus with hyperglycemia: Secondary | ICD-10-CM

## 2025-04-24 ENCOUNTER — Ambulatory Visit: Admitting: Nurse Practitioner
